# Patient Record
Sex: Female | Born: 1970 | Race: Black or African American | Hispanic: No | Marital: Married | Smoking: Never smoker
Health system: Southern US, Community
[De-identification: ages and names within clinical notes are randomized; demographics above are authoritative.]

## PROBLEM LIST (undated history)

## (undated) DIAGNOSIS — M1990 Unspecified osteoarthritis, unspecified site: Secondary | ICD-10-CM

## (undated) DIAGNOSIS — L509 Urticaria, unspecified: Secondary | ICD-10-CM

## (undated) DIAGNOSIS — D649 Anemia, unspecified: Secondary | ICD-10-CM

## (undated) DIAGNOSIS — D259 Leiomyoma of uterus, unspecified: Secondary | ICD-10-CM

## (undated) DIAGNOSIS — J302 Other seasonal allergic rhinitis: Secondary | ICD-10-CM

## (undated) DIAGNOSIS — D219 Benign neoplasm of connective and other soft tissue, unspecified: Secondary | ICD-10-CM

## (undated) DIAGNOSIS — A159 Respiratory tuberculosis unspecified: Secondary | ICD-10-CM

## (undated) DIAGNOSIS — IMO0002 Reserved for concepts with insufficient information to code with codable children: Secondary | ICD-10-CM

## (undated) DIAGNOSIS — I8390 Asymptomatic varicose veins of unspecified lower extremity: Secondary | ICD-10-CM

## (undated) DIAGNOSIS — N920 Excessive and frequent menstruation with regular cycle: Secondary | ICD-10-CM

## (undated) DIAGNOSIS — T7840XA Allergy, unspecified, initial encounter: Secondary | ICD-10-CM

## (undated) DIAGNOSIS — S83207A Unspecified tear of unspecified meniscus, current injury, left knee, initial encounter: Secondary | ICD-10-CM

## (undated) HISTORY — DX: Benign neoplasm of connective and other soft tissue, unspecified: D21.9

## (undated) HISTORY — PX: ENDOSCOPIC VEIN LASER TREATMENT: SHX1508

## (undated) HISTORY — DX: Urticaria, unspecified: L50.9

## (undated) HISTORY — DX: Allergy, unspecified, initial encounter: T78.40XA

## (undated) HISTORY — DX: Anemia, unspecified: D64.9

## (undated) HISTORY — PX: HERNIA REPAIR: SHX51

## (undated) HISTORY — PX: ESSURE TUBAL LIGATION: SUR464

---

## 2001-10-03 DIAGNOSIS — Z201 Contact with and (suspected) exposure to tuberculosis: Secondary | ICD-10-CM

## 2001-10-03 HISTORY — DX: Contact with and (suspected) exposure to tuberculosis: Z20.1

## 2007-07-31 ENCOUNTER — Inpatient Hospital Stay (HOSPITAL_COMMUNITY): Admission: AD | Admit: 2007-07-31 | Discharge: 2007-08-03 | Payer: Self-pay | Admitting: Obstetrics

## 2008-05-30 ENCOUNTER — Ambulatory Visit (HOSPITAL_COMMUNITY): Admission: RE | Admit: 2008-05-30 | Discharge: 2008-05-30 | Payer: Self-pay | Admitting: Obstetrics

## 2009-01-24 ENCOUNTER — Emergency Department (HOSPITAL_COMMUNITY): Admission: EM | Admit: 2009-01-24 | Discharge: 2009-01-24 | Payer: Self-pay | Admitting: Family Medicine

## 2010-11-18 ENCOUNTER — Encounter: Payer: Self-pay | Admitting: Family Medicine

## 2010-11-18 ENCOUNTER — Ambulatory Visit (INDEPENDENT_AMBULATORY_CARE_PROVIDER_SITE_OTHER): Payer: Self-pay | Admitting: Family Medicine

## 2010-11-18 DIAGNOSIS — K130 Diseases of lips: Secondary | ICD-10-CM | POA: Insufficient documentation

## 2010-11-22 ENCOUNTER — Other Ambulatory Visit (HOSPITAL_COMMUNITY): Payer: Self-pay | Admitting: Obstetrics & Gynecology

## 2010-11-22 DIAGNOSIS — N971 Female infertility of tubal origin: Secondary | ICD-10-CM

## 2010-11-24 NOTE — Assessment & Plan Note (Signed)
Summary: new pt to estab---umr //ns fee///sph   Vital Signs:  Patient profile:   40 year old female Menstrual status:  regular LMP:     10/21/2010 Height:      64 inches Weight:      258.0 pounds BMI:     44.45 Pulse rate:   72 / minute Pulse rhythm:   regular BP sitting:   116 / 68  (left arm) Cuff size:   large  Vitals Entered By: Almeta Monas CMA Duncan Dull) (November 18, 2010 1:08 PM) CC: New Est Care--treated for strep 3 weeks ago and c/o dry lips LMP (date): 10/21/2010     Menstrual Status regular Enter LMP: 10/21/2010   History of Present Illness: Pt here to establish ---pt had strep about 3 weeks ago.  Pt was given amoxicillin with no relief and then was given omnicef.  ST went away but then pt was c/o sore lips and tongue.  Her previous doctor gave her a yeast cream but she did not feel like that was the right thing to do.  It has improved but lips are still dry and cracked.    Preventive Screening-Counseling & Management  Alcohol-Tobacco     Smoking Status: never  Caffeine-Diet-Exercise     Does Patient Exercise: yes      Drug Use:  no.    Problems Prior to Update: None  Medications Prior to Update: 1)  None  Current Medications (verified): 1)  Multivitamins  Caps (Multiple Vitamin) .... By Mouth Once Daily 2)  Omega-3 350 Mg Caps (Omega-3 Fatty Acids) .... By Mouth Once Daily 3)  Fish Oil 1000 Mg Caps (Omega-3 Fatty Acids) .... By Mouth Once Daily 4)  Vitamin B-12 100 Mcg Tabs (Cyanocobalamin) .... By Mouth Once Daily 5)  Bee Pollen Plus Ginseng 250-250 Mg Caps (Bee Pollen-Ginseng) .... By Mouth Once Daily  Allergies (verified): No Known Drug Allergies  Past History:  Social History: Last updated: 11/18/2010 Occupation: Married Never Smoked Alcohol use-yes Drug use-no Regular exercise-yes  Risk Factors: Exercise: yes (11/18/2010)  Risk Factors: Smoking Status: never (11/18/2010)  Past Medical History: Tuberculosis Exposure  Social  History: Occupation: Married Never Smoked Alcohol use-yes Drug use-no Regular exercise-yes Occupation:  employed Smoking Status:  never Drug Use:  no Does Patient Exercise:  yes  Review of Systems      See HPI  Physical Exam  General:  Well-developed,well-nourished,in no acute distress; alert,appropriate and cooperative throughout examination Mouth:  dry lips and cracks in corner of mouth Psych:  Oriented X3 and normally interactive.     Impression & Recommendations:  Problem # 1:  ANGULAR CHEILITIS (ICD-528.5) nystatin ointment b1 thiamine rto as needed   Complete Medication List: 1)  Multivitamins Caps (Multiple vitamin) .... By mouth once daily 2)  Omega-3 350 Mg Caps (Omega-3 fatty acids) .... By mouth once daily 3)  Vitamin B-12 100 Mcg Tabs (Cyanocobalamin) .... By mouth once daily 4)  Bee Pollen Plus Ginseng 250-250 Mg Caps (Bee pollen-ginseng) .... By mouth once daily 5)  Allegra Allergy 180 Mg Tabs (Fexofenadine hcl) .Marland Kitchen.. 1 by mouth once daily as needed 6)  Nystatin 100000 Unit/gm Oint (Nystatin)  Patient Instructions: 1)  use nystatin ointment that was given to you already 2)  b vitamins esp thiamine/ b1 may help as well   Orders Added: 1)  New Patient Level II [40981]

## 2010-12-01 ENCOUNTER — Ambulatory Visit (HOSPITAL_COMMUNITY)
Admission: RE | Admit: 2010-12-01 | Discharge: 2010-12-01 | Disposition: A | Discharge status: Home / Self Care | Payer: 59 | Source: Ambulatory Visit | Attending: Obstetrics & Gynecology | Admitting: Obstetrics & Gynecology

## 2010-12-01 DIAGNOSIS — Z3049 Encounter for surveillance of other contraceptives: Secondary | ICD-10-CM | POA: Insufficient documentation

## 2010-12-01 DIAGNOSIS — N971 Female infertility of tubal origin: Secondary | ICD-10-CM

## 2010-12-02 ENCOUNTER — Telehealth: Payer: Self-pay | Admitting: Family Medicine

## 2010-12-09 NOTE — Progress Notes (Signed)
Summary: mouth/ lip  still no better  Phone Note Refill Request Call back at [phone]   Refills Requested: Medication #1:  NYSTATIN 100000 UNIT/GM OINT. Pt states that med is not helping. Pt c/o extra dry chapped lip and funny taste in mouth. Pt is request Rx for magic mouthwash. [facility] [address]  Pls advise .Marland KitchenMarland KitchenMarland KitchenFelecia Deloach CMA  December 02, 2010 4:59 PM    Follow-up for Phone Call        magic mouthwash 150 cc 5 ml swish and spit qid  if no better --refer to derm for lip but she should then she dentist about mouth Follow-up by: Loreen Freud DO,  December 02, 2010 5:11 PM  Additional Follow-up for Phone Call Additional follow up Details #1::        pt aware of the above.Marland KitchenMarland KitchenRx faxed to pharmacy Additional Follow-up by: Almeta Monas CMA Duncan Dull),  December 02, 2010 5:16 PM    New/Updated Medications: * MAGIC MOUTHWASH 150CC 5ml swish and spit four times a days Prescriptions: MAGIC MOUTHWASH 150CC 5ml swish and spit four times a days  #1 x 0   Entered by:   Almeta Monas CMA (AAMA)   Authorized by:   Loreen Freud DO   Signed by:   Almeta Monas CMA (AAMA) on 12/02/2010   Method used:   Faxed to ...       [facility] [phone]* (retail)       [address]       [place], [place]  [address]       Ph: [phone]       Fax: [phone]   RxID:   [id number]

## 2011-07-13 LAB — CBC
MCHC: 33.7
MCHC: 33.9
MCV: 85.2
Platelets: 179
Platelets: 203
RDW: 14.9 — ABNORMAL HIGH
RDW: 15.2 — ABNORMAL HIGH

## 2011-07-13 LAB — GLUCOSE, RANDOM: Glucose, Bld: 87

## 2011-07-13 LAB — RPR: RPR Ser Ql: NONREACTIVE

## 2012-01-20 ENCOUNTER — Encounter (INDEPENDENT_AMBULATORY_CARE_PROVIDER_SITE_OTHER): Payer: Self-pay | Admitting: General Surgery

## 2012-01-20 ENCOUNTER — Ambulatory Visit (INDEPENDENT_AMBULATORY_CARE_PROVIDER_SITE_OTHER): Payer: Commercial Managed Care - PPO | Admitting: General Surgery

## 2012-01-20 VITALS — BP 110/78 | HR 82 | Temp 96.8°F | Ht 65.0 in | Wt 249.0 lb

## 2012-01-20 DIAGNOSIS — D171 Benign lipomatous neoplasm of skin and subcutaneous tissue of trunk: Secondary | ICD-10-CM

## 2012-01-20 DIAGNOSIS — K439 Ventral hernia without obstruction or gangrene: Secondary | ICD-10-CM

## 2012-01-20 DIAGNOSIS — D1779 Benign lipomatous neoplasm of other sites: Secondary | ICD-10-CM

## 2012-01-20 NOTE — Progress Notes (Signed)
Patient ID: [name], female   DOB: Jan 10, 1971, 41 y.o.   MRN: [id number]  Chief Complaint  Patient presents with  . Pre-op Exam    eval hernia    HPI Stephanie Mcconnell is a 41 y.o. female.  This patient presents for evaluation of abdominal pain and a bulge in her midabdomen. She says that she has had a bulge for several years and just recently has began causing her symptoms. She says she has pain whenever she does activity and she has been trying to exercise and lose some weight but with the activity causes her pain. The discomfort she says is difficult to describe but she does state that it is relieved with rest. She does feel that the bulge has increased in size over this several years if he's had it appeared his denies any peptic symptoms such as nausea, vomiting, constipation or diarrhea. She also complains of a mass in her right upper back which does not cause any particular discomfort but has slightly increased in size as well over several years. She had seen a doctor previously for this and she was told that it was a lipoma and that there was no need to do anything about this. HPI  History reviewed. No pertinent past medical history. Bulging disk  History reviewed. No pertinent past surgical history.  History reviewed. No pertinent family history.  Social History History  Substance Use Topics  . Smoking status: Never Smoker   . Smokeless tobacco: Not on file  . Alcohol Use: Yes     rarely    No Known Allergies  Current Outpatient Prescriptions  Medication Sig Dispense Refill  . gabapentin (NEURONTIN) 300 MG capsule Take 600 mg by mouth 3 (three) times daily.      Marland Kitchen oxyCODONE-acetaminophen (PERCOCET) 5-325 MG per tablet Take 1 tablet by mouth as needed.        Review of Systems Review of Systems All other review of systems negative or noncontributory except as stated in the HPI  Blood pressure 110/78, pulse 82, temperature 96.8 F (36 C), temperature source Temporal,  height 5\' 5"  (1.651 m), weight 249 lb (112.946 kg), SpO2 98.00%.  Physical Exam Physical Exam Physical Exam  Nursing note and vitals reviewed. Constitutional: She is oriented to person, place, and time. She appears well-developed and well-nourished. No distress.  HENT:  Head: Normocephalic and atraumatic.  Mouth/Throat: No oropharyngeal exudate.  Eyes: Conjunctivae and EOM are normal. Pupils are equal, round, and reactive to light. Right eye exhibits no discharge. Left eye exhibits no discharge. No scleral icterus.  Neck: Normal range of motion. Neck supple. No tracheal deviation present.  Cardiovascular: Normal rate, regular rhythm, normal heart sounds and intact distal pulses.   Pulmonary/Chest: Effort normal and breath sounds normal. No stridor. No respiratory distress. She has no wheezes.  Abdominal: Soft. Bowel sounds are normal. She exhibits no distension and no mass. There is no tenderness. There is no rebound and no guarding. I do not feel any obvious bulge, such as hernia or lipoma. Musculoskeletal: Normal range of motion. She exhibits no edema and no tenderness.  Neurological: She is alert and oriented to person, place, and time.  Skin: Skin is warm and dry. No rash noted. She is not diaphoretic. No erythema. No pallor.  She has a large 12cm mass medial to her right scapula consistent with a hernia. Psychiatric: She has a normal mood and affect. Her behavior is normal. Judgment and thought content normal.  Data Reviewed   Assessment    Back lipoma I think that her back mass is most likely consistent with a lipoma. It is fairly large and so while we are imaging her abdomen to evaluate for possible hernia, I have added a CT chest as well which will hopefully visualize this mass appeared to have offered her excision of this and discussion of the risks of the procedure including infection, bleeding, pain, scarring, recurrence, need for future surgery, and nerve injury she expressed  understanding and would like to proceed with excision. Abdominal pain and possible hernia I do not appreciate any bulge on exam today due to her body habitus but it sounds like she is describing a midline ventral hernia or a possible lipoma. Given the fact that it is symptomatic and she says that she can reduce this, it sounds like a hernia and I recommended a CT scan of the abdomen to visualize this as well. If it is a small hernia that I would recommend open repair if it is a larger defect, then we would consider laparoscopic repair. We discussed the procedure and the risks of this as well. I will discuss the CT results with her after she obtains her scan    Plan    Ct scan chest and abdomen and we will schedule surgery after this.       Lodema Pilot DAVID 01/20/2012, 9:43 AM

## 2012-01-23 ENCOUNTER — Ambulatory Visit
Admission: RE | Admit: 2012-01-23 | Discharge: 2012-01-23 | Disposition: A | Discharge status: Home / Self Care | Payer: 59 | Source: Ambulatory Visit | Attending: General Surgery | Admitting: General Surgery

## 2012-01-23 DIAGNOSIS — D171 Benign lipomatous neoplasm of skin and subcutaneous tissue of trunk: Secondary | ICD-10-CM

## 2012-01-23 DIAGNOSIS — K439 Ventral hernia without obstruction or gangrene: Secondary | ICD-10-CM

## 2012-01-23 MED ORDER — IOHEXOL 300 MG/ML  SOLN
125.0000 mL | Freq: Once | INTRAMUSCULAR | Status: AC | PRN
  Administered 2012-01-23: 125 mL via INTRAVENOUS

## 2012-01-31 ENCOUNTER — Telehealth (INDEPENDENT_AMBULATORY_CARE_PROVIDER_SITE_OTHER): Payer: Self-pay | Admitting: General Surgery

## 2012-01-31 NOTE — Telephone Encounter (Signed)
Patient requesting CT results.

## 2012-02-09 ENCOUNTER — Encounter (INDEPENDENT_AMBULATORY_CARE_PROVIDER_SITE_OTHER): Payer: Self-pay

## 2012-02-13 ENCOUNTER — Telehealth (INDEPENDENT_AMBULATORY_CARE_PROVIDER_SITE_OTHER): Payer: Self-pay

## 2012-02-13 NOTE — Telephone Encounter (Signed)
Called Stephanie Mcconnell to discuss CT results with patient, patient has been informed that treatment plan will be discuss with Dr. Biagio Quint and we will contact her later this week to discuss.

## 2012-02-17 ENCOUNTER — Encounter (INDEPENDENT_AMBULATORY_CARE_PROVIDER_SITE_OTHER): Payer: Self-pay | Admitting: General Surgery

## 2012-02-20 ENCOUNTER — Ambulatory Visit (INDEPENDENT_AMBULATORY_CARE_PROVIDER_SITE_OTHER): Payer: Commercial Managed Care - PPO | Admitting: General Surgery

## 2012-02-20 VITALS — BP 130/84 | HR 71 | Temp 98.1°F | Resp 16 | Ht 65.0 in | Wt 254.2 lb

## 2012-02-20 DIAGNOSIS — D1779 Benign lipomatous neoplasm of other sites: Secondary | ICD-10-CM

## 2012-02-20 DIAGNOSIS — K439 Ventral hernia without obstruction or gangrene: Secondary | ICD-10-CM

## 2012-02-20 DIAGNOSIS — D171 Benign lipomatous neoplasm of skin and subcutaneous tissue of trunk: Secondary | ICD-10-CM

## 2012-02-20 NOTE — Progress Notes (Signed)
Patient ID: Stephanie Mcconnell, female   DOB: 07/09/1971, 41 y.o.   MRN: [id number]  Chief Complaint  Patient presents with  . Follow-up    Follow up from CT scan    HPI Stephanie Mcconnell is a 41 y.o. female.   HPI This patient follows up after CT scan to evaluate her back mass and abdominal wall mass. The back mass was consistent with a lipoma as expected and remains relatively asymptomatic. She is symptomatic from her abdominal wall mass which was consistent with a ventral hernia on CT scan. She says that this bothers her whenever she lifts or works out.  No past medical history on file.  No past surgical history on file.  No family history on file.  Social History History  Substance Use Topics  . Smoking status: Never Smoker   . Smokeless tobacco: Not on file  . Alcohol Use: Yes     rarely    No Known Allergies  Current Outpatient Prescriptions  Medication Sig Dispense Refill  . gabapentin (NEURONTIN) 300 MG capsule Take 600 mg by mouth 3 (three) times daily.      . oxyCODONE-acetaminophen (PERCOCET) 5-325 MG per tablet Take 1 tablet by mouth as needed.        Review of Systems Review of Systems All other review of systems negative or noncontributory except as stated in the HPI  Blood pressure 130/84, pulse 71, temperature 98.1 F (36.7 C), temperature source Temporal, resp. rate 16, height 5' 5" (1.651 m), weight 254 lb 3.2 oz (115.304 kg).  Physical Exam Physical Exam Physical Exam  Nursing note and vitals reviewed. Constitutional: She is oriented to person, place, and time. She appears well-developed and well-nourished. No distress.  HENT:  Head: Normocephalic and atraumatic.  Mouth/Throat: No oropharyngeal exudate.  Eyes: Conjunctivae and EOM are normal. Pupils are equal, round, and reactive to light. Right eye exhibits no discharge. Left eye exhibits no discharge. No scleral icterus.  Neck: Normal range of motion. Neck supple. No tracheal deviation present.    Cardiovascular: Normal rate, regular rhythm, normal heart sounds and intact distal pulses.   Pulmonary/Chest: Effort normal and breath sounds normal. No stridor. No respiratory distress. She has no wheezes.  Abdominal: Soft. Bowel sounds are normal. She exhibits no distension and no mass. There is no tenderness. There is no rebound and no guarding.  I do not appreciate the mass/hernia on exam but it is visible on CT Musculoskeletal: Normal range of motion. She exhibits no edema and no tenderness.  Neurological: She is alert and oriented to person, place, and time.  Skin: Skin is warm and dry. No rash noted. She is not diaphoretic. No erythema. No pallor. Large 15cm right back mass, near right scapula c/w lipoma Psychiatric: She has a normal mood and affect. Her behavior is normal. Judgment and thought content normal.    Data Reviewed CT  Assessment    Back lipoma Ventral hernia She is symptomatic from this ventral hernia and she would like to have this repaired first. The pain is to be able to fix both of these problems at the same time. However, because one is in the back and one is on the abdomen, I am concerned that this might be too much at one time. We will consider doing both of the same time but she has stated that she would like to have the ventral hernia repaired first and we will definitely plan to do this with or without the lipoma excision   as well. I discussed with her the risks and benefits of the procedure and risks of infection bleeding, pain, scarring, recurrence, need for open surgery, bowel injury and the need to do separate procedures for both of these. She expressed understanding and would like to proceed with a minimum of hernia repair. We will plan on starting laparoscopically and if the defect is not easily visualized, we will proceed with open repair.    Plan    Will set her up with lap ventral hernia repair with mesh, possible open, and possible excision of back mass  at the same time vs. Staged procedure.       Isaih Bulger DAVID 02/20/2012, 11:28 AM    

## 2012-03-01 ENCOUNTER — Encounter (HOSPITAL_COMMUNITY): Payer: Self-pay

## 2012-03-01 NOTE — Progress Notes (Addendum)
Pt called to obtain PAT assessment/Hx.  Pt denies having any cardiac and/or sleep studies.  Pt's PCP: Dr. Mindi Junker Hawks,[phone].  Pt instructed on lab appt date and time.  Pt verbalized back both and understanding of where to come. Pt request Advanced Directive Packet when she comes for lab appt.//L. Angenette Daily,RN

## 2012-03-02 ENCOUNTER — Encounter (HOSPITAL_COMMUNITY): Payer: Self-pay

## 2012-03-05 ENCOUNTER — Encounter (HOSPITAL_COMMUNITY)
Admission: RE | Admit: 2012-03-05 | Discharge: 2012-03-05 | Disposition: A | Discharge status: Home / Self Care | Payer: 59 | Source: Ambulatory Visit | Attending: General Surgery | Admitting: General Surgery

## 2012-03-05 VITALS — BP 121/72 | HR 79 | Temp 97.8°F | Resp 20 | Ht 65.0 in | Wt 257.4 lb

## 2012-03-05 DIAGNOSIS — K439 Ventral hernia without obstruction or gangrene: Secondary | ICD-10-CM

## 2012-03-05 DIAGNOSIS — D171 Benign lipomatous neoplasm of skin and subcutaneous tissue of trunk: Secondary | ICD-10-CM

## 2012-03-05 HISTORY — DX: Respiratory tuberculosis unspecified: A15.9

## 2012-03-05 LAB — CBC
MCH: 28.2 pg (ref 26.0–34.0)
MCHC: 33.4 g/dL (ref 30.0–36.0)
MCV: 84.4 fL (ref 78.0–100.0)
Platelets: 265 10*3/uL (ref 150–400)
RBC: 4.5 MIL/uL (ref 3.87–5.11)
RDW: 15.3 % (ref 11.5–15.5)

## 2012-03-05 LAB — SURGICAL PCR SCREEN: MRSA, PCR: NEGATIVE

## 2012-03-05 NOTE — Pre-Procedure Instructions (Signed)
20 Stephanie Mcconnell  03/05/2012   Your procedure is scheduled on:  Monday March 12, 2012.  Report to [facility] at 0830 AM.  Call this number if you have problems the morning of surgery: [phone]   Remember:   Do not eat food:After Midnight.  May have clear liquids: up to 4 Hours before arrival until 0430 am.  Clear liquids include soda, tea, black coffee, apple or grape juice, broth.  Take these medicines the morning of surgery with A SIP OF WATER: Oxycodone (Percocet) if needed for pain.   Do not wear jewelry, make-up or nail polish.  Do not wear lotions, powders, or perfumes. You may wear deodorant.  Do not shave 48 hours prior to surgery. Men may shave face and neck.  Do not bring valuables to the hospital.  Contacts, dentures or bridgework may not be worn into surgery.  Leave suitcase in the car. After surgery it may be brought to your room.  For patients admitted to the hospital, checkout time is 11:00 AM the day of discharge.   Patients discharged the day of surgery will not be allowed to drive home.  Name and phone number of your driver:   Special Instructions: CHG Shower Use Special Wash: 1/2 bottle night before surgery and 1/2 bottle morning of surgery.   Please read over the following fact sheets that you were given: Pain Booklet, Coughing and Deep Breathing, MRSA Information and Surgical Site Infection Prevention

## 2012-03-06 NOTE — Consult Note (Signed)
Anesthesia Chart Review:  Patient is a 41 year old female scheduled for laparoscopic VHR with mesh, possible excision of right back mass (felt to be a lipoma) on 03/12/12.  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  PCP is Dr. Fredia Beets.    Labs acceptable.  Plan to proceed.  Shonna Chock, PA-C

## 2012-03-11 MED ORDER — CEFAZOLIN SODIUM-DEXTROSE 2-3 GM-% IV SOLR
2.0000 g | INTRAVENOUS | Status: AC
  Administered 2012-03-12: 2 g via INTRAVENOUS
  Filled 2012-03-11: qty 50

## 2012-03-12 ENCOUNTER — Encounter (HOSPITAL_COMMUNITY)
Admission: RE | Disposition: A | Discharge status: Home / Self Care | Payer: Self-pay | Source: Ambulatory Visit | Attending: General Surgery

## 2012-03-12 ENCOUNTER — Encounter (HOSPITAL_COMMUNITY): Payer: Self-pay | Admitting: Vascular Surgery

## 2012-03-12 ENCOUNTER — Inpatient Hospital Stay (HOSPITAL_COMMUNITY)
Admission: RE | Admit: 2012-03-12 | Discharge: 2012-03-14 | DRG: 355 | Disposition: A | Discharge status: Home / Self Care | Payer: 59 | Source: Ambulatory Visit | Attending: General Surgery | Admitting: General Surgery

## 2012-03-12 ENCOUNTER — Encounter (HOSPITAL_COMMUNITY): Payer: Self-pay | Admitting: *Deleted

## 2012-03-12 ENCOUNTER — Ambulatory Visit (HOSPITAL_COMMUNITY): Payer: 59 | Admitting: Vascular Surgery

## 2012-03-12 DIAGNOSIS — K439 Ventral hernia without obstruction or gangrene: Principal | ICD-10-CM | POA: Diagnosis present

## 2012-03-12 DIAGNOSIS — R112 Nausea with vomiting, unspecified: Secondary | ICD-10-CM | POA: Diagnosis not present

## 2012-03-12 DIAGNOSIS — D1739 Benign lipomatous neoplasm of skin and subcutaneous tissue of other sites: Secondary | ICD-10-CM

## 2012-03-12 DIAGNOSIS — K432 Incisional hernia without obstruction or gangrene: Secondary | ICD-10-CM | POA: Diagnosis present

## 2012-03-12 DIAGNOSIS — D171 Benign lipomatous neoplasm of skin and subcutaneous tissue of trunk: Secondary | ICD-10-CM

## 2012-03-12 DIAGNOSIS — D1779 Benign lipomatous neoplasm of other sites: Secondary | ICD-10-CM | POA: Diagnosis present

## 2012-03-12 DIAGNOSIS — Z79899 Other long term (current) drug therapy: Secondary | ICD-10-CM

## 2012-03-12 HISTORY — DX: Reserved for concepts with insufficient information to code with codable children: IMO0002

## 2012-03-12 HISTORY — PX: VENTRAL HERNIA REPAIR: SHX424

## 2012-03-12 HISTORY — PX: MASS EXCISION: SHX2000

## 2012-03-12 SURGERY — REPAIR, HERNIA, VENTRAL, LAPAROSCOPIC
Anesthesia: General | Site: Back | Laterality: Right | Wound class: Clean

## 2012-03-12 MED ORDER — EPHEDRINE SULFATE 50 MG/ML IJ SOLN
INTRAMUSCULAR | Status: DC | PRN
  Administered 2012-03-12: 15 mg via INTRAVENOUS
  Administered 2012-03-12: 10 mg via INTRAVENOUS

## 2012-03-12 MED ORDER — HEPARIN SODIUM (PORCINE) 5000 UNIT/ML IJ SOLN
5000.0000 [IU] | Freq: Once | INTRAMUSCULAR | Status: AC
  Administered 2012-03-12: 5000 [IU] via SUBCUTANEOUS
  Filled 2012-03-12: qty 1

## 2012-03-12 MED ORDER — ROCURONIUM BROMIDE 100 MG/10ML IV SOLN
INTRAVENOUS | Status: DC | PRN
  Administered 2012-03-12: 50 mg via INTRAVENOUS

## 2012-03-12 MED ORDER — LACTATED RINGERS IV SOLN
INTRAVENOUS | Status: DC
  Administered 2012-03-12: 10:00:00 via INTRAVENOUS

## 2012-03-12 MED ORDER — FENTANYL CITRATE 0.05 MG/ML IJ SOLN
INTRAMUSCULAR | Status: DC | PRN
  Administered 2012-03-12: 50 ug via INTRAVENOUS
  Administered 2012-03-12 (×2): 25 ug via INTRAVENOUS
  Administered 2012-03-12: 150 ug via INTRAVENOUS

## 2012-03-12 MED ORDER — LIDOCAINE HCL (CARDIAC) 20 MG/ML IV SOLN
INTRAVENOUS | Status: DC | PRN
  Administered 2012-03-12: 100 mg via INTRAVENOUS

## 2012-03-12 MED ORDER — ENOXAPARIN SODIUM 40 MG/0.4ML ~~LOC~~ SOLN
40.0000 mg | SUBCUTANEOUS | Status: DC
  Administered 2012-03-13 – 2012-03-14 (×2): 40 mg via SUBCUTANEOUS
  Filled 2012-03-12 (×2): qty 0.4

## 2012-03-12 MED ORDER — PHENYLEPHRINE HCL 10 MG/ML IJ SOLN
INTRAMUSCULAR | Status: DC | PRN
  Administered 2012-03-12: 120 ug via INTRAVENOUS
  Administered 2012-03-12 (×2): 80 ug via INTRAVENOUS
  Administered 2012-03-12: 120 ug via INTRAVENOUS
  Administered 2012-03-12 (×3): 80 ug via INTRAVENOUS

## 2012-03-12 MED ORDER — ONDANSETRON HCL 4 MG/2ML IJ SOLN
INTRAMUSCULAR | Status: DC | PRN
  Administered 2012-03-12: 4 mg via INTRAVENOUS

## 2012-03-12 MED ORDER — CEFAZOLIN SODIUM-DEXTROSE 2-3 GM-% IV SOLR
2.0000 g | Freq: Once | INTRAVENOUS | Status: AC
  Administered 2012-03-12: 2 g via INTRAVENOUS
  Filled 2012-03-12: qty 50

## 2012-03-12 MED ORDER — LACTATED RINGERS IV SOLN
INTRAVENOUS | Status: DC | PRN
  Administered 2012-03-12 (×3): via INTRAVENOUS

## 2012-03-12 MED ORDER — MIDAZOLAM HCL 2 MG/ML PO SYRP: 0.5000 mg/kg | ORAL_SOLUTION | Freq: Once | ORAL | Status: DC

## 2012-03-12 MED ORDER — GABAPENTIN 300 MG PO CAPS
600.0000 mg | ORAL_CAPSULE | Freq: Three times a day (TID) | ORAL | Status: DC
Start: 2012-03-12 — End: 2012-03-14
  Administered 2012-03-12 – 2012-03-14 (×6): 600 mg via ORAL
  Filled 2012-03-12 (×9): qty 2

## 2012-03-12 MED ORDER — 0.9 % SODIUM CHLORIDE (POUR BTL) OPTIME
TOPICAL | Status: DC | PRN
  Administered 2012-03-12: 1000 mL

## 2012-03-12 MED ORDER — OXYCODONE-ACETAMINOPHEN 5-325 MG PO TABS
1.0000 | ORAL_TABLET | ORAL | Status: DC | PRN
  Administered 2012-03-12 – 2012-03-13 (×4): 2 via ORAL
  Filled 2012-03-12 (×4): qty 2

## 2012-03-12 MED ORDER — MIDAZOLAM HCL 2 MG/2ML IJ SOLN: 0.5000 mg | INTRAMUSCULAR | Status: DC | PRN

## 2012-03-12 MED ORDER — KETOROLAC TROMETHAMINE 30 MG/ML IJ SOLN
30.0000 mg | Freq: Four times a day (QID) | INTRAMUSCULAR | Status: DC | PRN
  Administered 2012-03-12: 30 mg via INTRAVENOUS
  Filled 2012-03-12: qty 1

## 2012-03-12 MED ORDER — ONDANSETRON HCL 4 MG/2ML IJ SOLN: 4.0000 mg | Freq: Once | INTRAMUSCULAR | Status: DC | PRN

## 2012-03-12 MED ORDER — GLYCOPYRROLATE 0.2 MG/ML IJ SOLN
INTRAMUSCULAR | Status: DC | PRN
  Administered 2012-03-12: .7 mg via INTRAVENOUS

## 2012-03-12 MED ORDER — ONDANSETRON HCL 4 MG PO TABS
4.0000 mg | ORAL_TABLET | Freq: Four times a day (QID) | ORAL | Status: DC | PRN
  Administered 2012-03-13: 4 mg via ORAL
  Filled 2012-03-12: qty 1

## 2012-03-12 MED ORDER — HYDROMORPHONE HCL PF 1 MG/ML IJ SOLN
INTRAMUSCULAR | Status: AC
  Filled 2012-03-12: qty 1

## 2012-03-12 MED ORDER — MORPHINE SULFATE 2 MG/ML IJ SOLN
1.0000 mg | INTRAMUSCULAR | Status: DC | PRN
  Administered 2012-03-12 (×2): 2 mg via INTRAVENOUS
  Filled 2012-03-12 (×2): qty 1

## 2012-03-12 MED ORDER — LACTATED RINGERS IV SOLN: INTRAVENOUS | Status: DC

## 2012-03-12 MED ORDER — KCL IN DEXTROSE-NACL 20-5-0.45 MEQ/L-%-% IV SOLN
INTRAVENOUS | Status: DC
  Administered 2012-03-12 – 2012-03-13 (×2): via INTRAVENOUS
  Filled 2012-03-12 (×5): qty 1000

## 2012-03-12 MED ORDER — HYDROMORPHONE HCL PF 1 MG/ML IJ SOLN
0.2500 mg | INTRAMUSCULAR | Status: DC | PRN
  Administered 2012-03-12 (×3): 0.5 mg via INTRAVENOUS

## 2012-03-12 MED ORDER — MONTELUKAST SODIUM 10 MG PO TABS
10.0000 mg | ORAL_TABLET | Freq: Every evening | ORAL | Status: DC | PRN
  Filled 2012-03-12: qty 1

## 2012-03-12 MED ORDER — PROPOFOL 10 MG/ML IV BOLUS
INTRAVENOUS | Status: DC | PRN
  Administered 2012-03-12: 120 mg via INTRAVENOUS

## 2012-03-12 MED ORDER — VECURONIUM BROMIDE 10 MG IV SOLR
INTRAVENOUS | Status: DC | PRN
  Administered 2012-03-12: 2 mg via INTRAVENOUS
  Administered 2012-03-12: 1 mg via INTRAVENOUS

## 2012-03-12 MED ORDER — NEOSTIGMINE METHYLSULFATE 1 MG/ML IJ SOLN
INTRAMUSCULAR | Status: DC | PRN
  Administered 2012-03-12: 4 mg via INTRAVENOUS

## 2012-03-12 MED ORDER — ONDANSETRON HCL 4 MG/2ML IJ SOLN
4.0000 mg | Freq: Four times a day (QID) | INTRAMUSCULAR | Status: DC | PRN
  Administered 2012-03-12: 4 mg via INTRAVENOUS
  Filled 2012-03-12: qty 2

## 2012-03-12 MED ORDER — LIDOCAINE-EPINEPHRINE (PF) 1 %-1:200000 IJ SOLN
INTRAMUSCULAR | Status: DC | PRN
  Administered 2012-03-12: 14:00:00

## 2012-03-12 MED ORDER — MIDAZOLAM HCL 5 MG/5ML IJ SOLN
INTRAMUSCULAR | Status: DC | PRN
  Administered 2012-03-12: 2 mg via INTRAVENOUS

## 2012-03-12 MED ORDER — FENTANYL CITRATE 0.05 MG/ML IJ SOLN: 50.0000 ug | INTRAMUSCULAR | Status: DC | PRN

## 2012-03-12 SURGICAL SUPPLY — 87 items
ADH SKN CLS APL DERMABOND .7 (GAUZE/BANDAGES/DRESSINGS) ×6
ADH SKN CLS LQ APL DERMABOND (GAUZE/BANDAGES/DRESSINGS)
APL SKNCLS STERI-STRIP NONHPOA (GAUZE/BANDAGES/DRESSINGS) ×2
APPLIER CLIP LOGIC TI 5 (MISCELLANEOUS) IMPLANT
APPLIER CLIP ROT 10 11.4 M/L (STAPLE)
APR CLP MED LRG 11.4X10 (STAPLE)
APR CLP MED LRG 33X5 (MISCELLANEOUS)
BENZOIN TINCTURE PRP APPL 2/3 (GAUZE/BANDAGES/DRESSINGS) ×1 IMPLANT
BINDER ABD UNIV 12 45-62 (WOUND CARE) IMPLANT
BINDER ABDOMINAL 46IN 62IN (WOUND CARE)
BLADE SURG 10 STRL SS (BLADE) ×3 IMPLANT
BLADE SURG 15 STRL LF DISP TIS (BLADE) ×2 IMPLANT
BLADE SURG 15 STRL SS (BLADE) ×3
BLADE SURG ROTATE 9660 (MISCELLANEOUS) IMPLANT
CANISTER SUCTION 2500CC (MISCELLANEOUS) IMPLANT
CHLORAPREP W/TINT 26ML (MISCELLANEOUS) ×3 IMPLANT
CLIP APPLIE ROT 10 11.4 M/L (STAPLE) IMPLANT
CLOTH BEACON ORANGE TIMEOUT ST (SAFETY) ×3 IMPLANT
COVER SURGICAL LIGHT HANDLE (MISCELLANEOUS) ×3 IMPLANT
DECANTER SPIKE VIAL GLASS SM (MISCELLANEOUS) IMPLANT
DERMABOND ADHESIVE PROPEN (GAUZE/BANDAGES/DRESSINGS)
DERMABOND ADVANCED (GAUZE/BANDAGES/DRESSINGS) ×3
DERMABOND ADVANCED .7 DNX12 (GAUZE/BANDAGES/DRESSINGS) IMPLANT
DERMABOND ADVANCED .7 DNX6 (GAUZE/BANDAGES/DRESSINGS) ×2 IMPLANT
DEVICE SECURE STRAP 25 ABSORB (INSTRUMENTS) ×2 IMPLANT
DEVICE TROCAR PUNCTURE CLOSURE (ENDOMECHANICALS) ×3 IMPLANT
DRAPE INCISE IOBAN 66X45 STRL (DRAPES) ×3 IMPLANT
DRAPE LAPAROSCOPIC ABDOMINAL (DRAPES) IMPLANT
DRAPE PED LAPAROTOMY (DRAPES) ×1 IMPLANT
DRAPE UTILITY 15X26 W/TAPE STR (DRAPE) ×6 IMPLANT
DRSG TEGADERM 4X4.75 (GAUZE/BANDAGES/DRESSINGS) IMPLANT
ELECT CAUTERY BLADE 6.4 (BLADE) ×3 IMPLANT
ELECT REM PT RETURN 9FT ADLT (ELECTROSURGICAL) ×3
ELECTRODE REM PT RTRN 9FT ADLT (ELECTROSURGICAL) ×2 IMPLANT
GLOVE BIOGEL PI IND STRL 7.0 (GLOVE) IMPLANT
GLOVE BIOGEL PI IND STRL 7.5 (GLOVE) IMPLANT
GLOVE BIOGEL PI INDICATOR 7.0 (GLOVE) ×1
GLOVE BIOGEL PI INDICATOR 7.5 (GLOVE) ×1
GLOVE ECLIPSE 7.5 STRL STRAW (GLOVE) ×1 IMPLANT
GLOVE OPTIFIT SS 6.0 STRL BRWN (GLOVE) ×1 IMPLANT
GLOVE SS BIOGEL STRL SZ 6.5 (GLOVE) IMPLANT
GLOVE SUPERSENSE BIOGEL SZ 6.5 (GLOVE) ×1
GLOVE SURG SIGNA 7.5 PF LTX (GLOVE) ×1 IMPLANT
GLOVE SURG SS PI 7.0 STRL IVOR (GLOVE) ×1 IMPLANT
GLOVE SURG SS PI 7.5 STRL IVOR (GLOVE) ×6 IMPLANT
GOWN PREVENTION PLUS XLARGE (GOWN DISPOSABLE) ×5 IMPLANT
GOWN STRL NON-REIN LRG LVL3 (GOWN DISPOSABLE) ×4 IMPLANT
KIT BASIN OR (CUSTOM PROCEDURE TRAY) ×3 IMPLANT
KIT ROOM TURNOVER OR (KITS) ×3 IMPLANT
MESH PHYSIO OVAL 10X15CM (Mesh General) ×1 IMPLANT
NDL HYPO 25X1 1.5 SAFETY (NEEDLE) ×2 IMPLANT
NDL SPNL 22GX3.5 QUINCKE BK (NEEDLE) ×2 IMPLANT
NEEDLE HYPO 25X1 1.5 SAFETY (NEEDLE) ×3 IMPLANT
NEEDLE SPNL 22GX3.5 QUINCKE BK (NEEDLE) ×6 IMPLANT
NS IRRIG 1000ML POUR BTL (IV SOLUTION) ×3 IMPLANT
PACK SURGICAL SETUP 50X90 (CUSTOM PROCEDURE TRAY) ×2 IMPLANT
PAD ARMBOARD 7.5X6 YLW CONV (MISCELLANEOUS) ×6 IMPLANT
PEN SKIN MARKING BROAD (MISCELLANEOUS) ×3 IMPLANT
PENCIL BUTTON HOLSTER BLD 10FT (ELECTRODE) ×3 IMPLANT
SCALPEL HARMONIC ACE (MISCELLANEOUS) IMPLANT
SCISSORS LAP 5X35 DISP (ENDOMECHANICALS) IMPLANT
SET IRRIG TUBING LAPAROSCOPIC (IRRIGATION / IRRIGATOR) IMPLANT
SLEEVE ENDOPATH XCEL 5M (ENDOMECHANICALS) ×4 IMPLANT
SPECIMEN JAR MEDIUM (MISCELLANEOUS) ×3 IMPLANT
SPONGE GAUZE 4X4 12PLY (GAUZE/BANDAGES/DRESSINGS) ×1 IMPLANT
SPONGE LAP 18X18 X RAY DECT (DISPOSABLE) ×2 IMPLANT
STRIP CLOSURE SKIN 1/2X4 (GAUZE/BANDAGES/DRESSINGS) ×1 IMPLANT
SUT GORETEX 48 CV 2 THX 26 (SUTURE) ×1 IMPLANT
SUT MNCRL AB 4-0 PS2 18 (SUTURE) ×4 IMPLANT
SUT PROLENE 0 CT 1 CR/8 (SUTURE) ×1 IMPLANT
SUT VIC AB 2-0 CT1 27 (SUTURE) ×6
SUT VIC AB 2-0 CT1 TAPERPNT 27 (SUTURE) IMPLANT
SUT VIC AB 3-0 SH 27 (SUTURE) ×3
SUT VIC AB 3-0 SH 27XBRD (SUTURE) ×2 IMPLANT
SYR BULB 3OZ (MISCELLANEOUS) ×3 IMPLANT
SYR CONTROL 10ML LL (SYRINGE) ×3 IMPLANT
TAPE CLOTH SURG 6X10 WHT LF (GAUZE/BANDAGES/DRESSINGS) ×1 IMPLANT
TOWEL OR 17X24 6PK STRL BLUE (TOWEL DISPOSABLE) ×3 IMPLANT
TOWEL OR 17X26 10 PK STRL BLUE (TOWEL DISPOSABLE) ×3 IMPLANT
TRAY FOLEY CATH 14FRSI W/METER (CATHETERS) ×3 IMPLANT
TRAY LAPAROSCOPIC (CUSTOM PROCEDURE TRAY) ×3 IMPLANT
TROCAR BALLN 12MMX100 BLUNT (TROCAR) ×1 IMPLANT
TROCAR XCEL NON-BLD 11X100MML (ENDOMECHANICALS) ×3 IMPLANT
TROCAR XCEL NON-BLD 5MMX100MML (ENDOMECHANICALS) ×3 IMPLANT
TUBE CONNECTING 12X1/4 (SUCTIONS) IMPLANT
WATER STERILE IRR 1000ML POUR (IV SOLUTION) IMPLANT
YANKAUER SUCT BULB TIP NO VENT (SUCTIONS) IMPLANT

## 2012-03-12 NOTE — Op Note (Signed)
NAMESHIANA, RAPPLEYE               ACCOUNT NO.:  [id number]  MEDICAL RECORD NO.:  [id number]  LOCATION:  5159                         FACILITY:  [facility]  PHYSICIAN:  Lodema Pilot, MD       DATE OF BIRTH:  06-05-1971  DATE OF PROCEDURE:  03/12/2012 DATE OF DISCHARGE:                              OPERATIVE REPORT   PROCEDURE:  Laparoscopic repair of ventral hernia with mesh and excision of right back mass.  SURGEON:  Lodema Pilot, MD  ASSISTANT:  Dr. Ezzard Standing.  ANESTHESIA:  General endotracheal anesthesia with 30 mL of 1% lidocaine with epinephrine and 0.25% Marcaine in a 50:50 mixture.  FLUIDS:  2500 mL of crystalloid.  ESTIMATED BLOOD LOSS:  Minimal.  DRAINS:  None.  SPECIMENS:  Right back mass measuring 9 cm x 8 cm sent to pathology for permanent section.  COMPLICATIONS:  None apparent.  FINDINGS:  Small midline ventral hernia at the base of the falciform ligament 2 cm in maximal dimension, with a significant amount of preperitoneal fat reduced from the defect.  No evidence of bowel contents.  Placement of a 10 cm x 15 cm Physiomesh and excision of right upper back mass measuring approximately 9 cm x 8 cm.  INDICATION FOR PROCEDURE:  Ms. Cavey is a 41 year old female, who works as an [employer] and has a palpable bulge in the midline of her abdomen.  She says that it has been increasingly more uncomfortable recently and has prevented her from actually working up and she has been trying to get back into shape and it causes discomfort for her.  She had a CT scan, which demonstrated a fat containing ventral hernia in the area of concern.  She also had a 10 cm mass in the subcutaneous tissues just medial to her scapula which was causing minimal discomfort, but had been increasing in size, and desired excision of this mass.  Despite the fact that her insurance had denied the initial request for her reimbursement.  OPERATIVE DETAILS:  Ms. Schamp was  seen and evaluated in the preoperative area, and risks and benefits of procedure were again discussed in lay terms.  Informed consent was obtained.  The surgical site was marked with the patient prior to anesthetic administration.  The area of the palpable hernia was marked as well as the back mass.  She was then taken to the operating room, placed on table in a supine position with her arms tucked bilaterally.  Foley catheter was placed.  Prophylactic antibiotics were given and general endotracheal anesthesia obtained. Also, given her a prophylactic subcu heparin prior to the case.  Her abdomen was then prepped and draped in the standard surgical fashion. Procedure time-out was performed with all operative team members to confirm proper patient and procedure, and Ioban drape was placed to minimize the skin contact with the mesh.  A 5-mm Optiview trocar was used to access the abdomen in the left upper quadrant and pneumoperitoneum was obtained.  An 11 mm left lateral trocar was placed under direct visualization, and two 5 mm right lateral abdominal trocars were also placed under direct visualization.  She was noted to have a hernia  defect at the base of the falciform ligament, and I opened the peritoneum with the scissors, there was no bowel in the defect, and then using the heated scissors I took down the peritoneum and then I was able to manually reduce the preperitoneal fat, and she actually had a fairly significant amount of preperitoneal fat from the falciform ligament which was incarcerated up into the hernia defect, this was taken down out of the whole leading at approximately a 2 cm x 2 cm defect in the falciform ligament was taken down further cephalad to allow for adequate overlap, and then a 10 cm x 15 cm piece of Physiomesh was selected and 2- 0 Gore-Tex sutures were placed at the cephalad and caudad aspect of the mesh.  The mesh was rolled and placed into the abdomen and  unrolled, using the Endoclose device, the sutures were pulled out through the abdominal wall transvaginally to at least 5 cm of overlap on each side of the cephalad and caudad aspects of the mesh.  There was actually more overlapped to the corner.  These sutures were pulled up and secured and the mesh was tacked laterally on each side, taking care not to stretch the mesh too much to 1 side or the other.  Then, the mesh tacked to the abdominal wall.  Transfascial sutures were placed on each side of the lateral aspects of the mesh with a 0 Prolene sutures passed transfascial at the periphery of the mesh.  Then, the SecureStrap tacking device was used to tack the mesh circumferentially around the periphery of the mesh at approximately 1 cm intervals around the mesh.  Additional transfascial sutures were placed circumferentially using 0 Prolene sutures for a total of 8 transfascial sutures, all placed under direct visualization.  The mesh appeared to be centered over the defect and the mesh was stretched tight and again cover the defect with wide overlap. The abdominal wall contents were then noted be hemostatic and the abdominal wall was noted to be hemostatic, and the 11 mm trocar site was closed with the Endoclose device and a 0 Vicryl figure-of-eight suture. The trocars were then removed under direct visualization.  Abdominal wall was noted to be hemostatic.  The abdominal contents were noted to be hemostatic without any evidence of bowel injury or bleeding.  The skin edges were approximated with 4-0 Monocryl subcuticular suture, and it was washed and dried and Dermabond was applied to all skin incisions. The transfascial suture sites were closed with Dermabond.  The patient was then rolled up on three-quarter prone at a most lateral position and the area around the back was prepped and draped in similar fashion using new towels and drapes.  A transverse incision was made over the  palpable mass.  Dissection carried down into the fatty tissue using Bovie electrocautery.  The mass was dissected circumferentially using mostly blunt dissection, although the mass was very loculated and lobulated and it did not come out, it was not well encapsulated.  I delivered most of the fatty tissue up into the mesh, but this was intramuscular and subfascial in some areas as it appeared to be lobulated mass and almost a Swiss cheese type defects as well where the fat was entering the fascia and subcutaneous tissue.  The bulk of the mass was removed and measured approximately 9 cm x 8 cm, and I palpated the area and additional pockets of lipomatous tissue were removed, and after I was certain that I had removed all of  the lipomatous tissue, the wound was inspected for hemostasis which was noted be adequate.  Irrigation returned clear and the dermis was then approximated with interrupted 2-0 Vicryl sutures and the skin edges were approximated with 2-0 nylon vertical mattress sutures.  The skin was washed and dried and a sterile pressure dressing was applied.  All sponge, needle, and instrument counts were correct at the end of the case.  The patient tolerated the procedure well, without apparent complications.  She was rolled back on her back for extubation and noted that 2 of the transfascial suture sites some of the Dermabond had come up into bowel, she was still anesthetized, re-prepped this area and removed the Dermabond and that was mostly removed and approximated these 2 transfascial suture sites with benzoin and Steri-Strips.  All sponge, needle, and instrument counts were correct at the end of the case.  Patient tolerated the procedure well without apparent complications.          ______________________________ Lodema Pilot, MD     BL/MEDQ  D:  03/12/2012  T:  03/12/2012  Job:  [id number]

## 2012-03-12 NOTE — Preoperative (Signed)
Beta Blockers   Reason not to administer Beta Blockers:Not Applicable 

## 2012-03-12 NOTE — Transfer of Care (Signed)
Immediate Anesthesia Transfer of Care Note  Patient: Stephanie Mcconnell  Procedure(s) Performed: Procedure(s) (LRB): LAPAROSCOPIC VENTRAL HERNIA (N/A) INSERTION OF MESH (N/A) EXCISION MASS (Right)  Patient Location: PACU  Anesthesia Type: General  Level of Consciousness: awake, alert , oriented and patient cooperative  Airway & Oxygen Therapy: Patient Spontanous Breathing and Patient connected to nasal cannula oxygen  Post-op Assessment: Report given to PACU RN, Post -op Vital signs reviewed and stable and Patient moving all extremities X 4  Post vital signs: Reviewed and stable  Complications: No apparent anesthesia complications

## 2012-03-12 NOTE — Anesthesia Procedure Notes (Signed)
Procedure Name: Intubation Date/Time: 03/12/2012 11:16 AM Performed by: Rogelia Boga Pre-anesthesia Checklist: Patient identified, Emergency Drugs available, Suction available, Patient being monitored and Timeout performed Patient Re-evaluated:Patient Re-evaluated prior to inductionOxygen Delivery Method: Circle system utilized Preoxygenation: Pre-oxygenation with 100% oxygen Intubation Type: IV induction Ventilation: Mask ventilation without difficulty and Oral airway inserted - appropriate to patient size Laryngoscope Size: Mac and 4 Grade View: Grade I Tube type: Oral Tube size: 7.5 mm Number of attempts: 1 Airway Equipment and Method: Stylet Placement Confirmation: ETT inserted through vocal cords under direct vision,  positive ETCO2 and breath sounds checked- equal and bilateral Secured at: 21 cm Tube secured with: Tape Dental Injury: Teeth and Oropharynx as per pre-operative assessment  Comments: Pt intubated by Shanda Bumps May, SRNA, AOI

## 2012-03-12 NOTE — H&P (View-Only) (Signed)
Patient ID: Stephanie Mcconnell, female   DOB: 1971-02-04, 41 y.o.   MRN: [id number]  Chief Complaint  Patient presents with  . Follow-up    Follow up from CT scan    HPI Stephanie Mcconnell is a 41 y.o. female.   HPI This patient follows up after CT scan to evaluate her back mass and abdominal wall mass. The back mass was consistent with a lipoma as expected and remains relatively asymptomatic. She is symptomatic from her abdominal wall mass which was consistent with a ventral hernia on CT scan. She says that this bothers her whenever she lifts or works out.  No past medical history on file.  No past surgical history on file.  No family history on file.  Social History History  Substance Use Topics  . Smoking status: Never Smoker   . Smokeless tobacco: Not on file  . Alcohol Use: Yes     rarely    No Known Allergies  Current Outpatient Prescriptions  Medication Sig Dispense Refill  . gabapentin (NEURONTIN) 300 MG capsule Take 600 mg by mouth 3 (three) times daily.      Marland Kitchen oxyCODONE-acetaminophen (PERCOCET) 5-325 MG per tablet Take 1 tablet by mouth as needed.        Review of Systems Review of Systems All other review of systems negative or noncontributory except as stated in the HPI  Blood pressure 130/84, pulse 71, temperature 98.1 F (36.7 C), temperature source Temporal, resp. rate 16, height 5\' 5"  (1.651 m), weight 254 lb 3.2 oz (115.304 kg).  Physical Exam Physical Exam Physical Exam  Nursing note and vitals reviewed. Constitutional: She is oriented to person, place, and time. She appears well-developed and well-nourished. No distress.  HENT:  Head: Normocephalic and atraumatic.  Mouth/Throat: No oropharyngeal exudate.  Eyes: Conjunctivae and EOM are normal. Pupils are equal, round, and reactive to light. Right eye exhibits no discharge. Left eye exhibits no discharge. No scleral icterus.  Neck: Normal range of motion. Neck supple. No tracheal deviation present.    Cardiovascular: Normal rate, regular rhythm, normal heart sounds and intact distal pulses.   Pulmonary/Chest: Effort normal and breath sounds normal. No stridor. No respiratory distress. She has no wheezes.  Abdominal: Soft. Bowel sounds are normal. She exhibits no distension and no mass. There is no tenderness. There is no rebound and no guarding.  I do not appreciate the mass/hernia on exam but it is visible on CT Musculoskeletal: Normal range of motion. She exhibits no edema and no tenderness.  Neurological: She is alert and oriented to person, place, and time.  Skin: Skin is warm and dry. No rash noted. She is not diaphoretic. No erythema. No pallor. Large 15cm right back mass, near right scapula c/w lipoma Psychiatric: She has a normal mood and affect. Her behavior is normal. Judgment and thought content normal.    Data Reviewed CT  Assessment    Back lipoma Ventral hernia She is symptomatic from this ventral hernia and she would like to have this repaired first. The pain is to be able to fix both of these problems at the same time. However, because one is in the back and one is on the abdomen, I am concerned that this might be too much at one time. We will consider doing both of the same time but she has stated that she would like to have the ventral hernia repaired first and we will definitely plan to do this with or without the lipoma excision  as well. I discussed with her the risks and benefits of the procedure and risks of infection bleeding, pain, scarring, recurrence, need for open surgery, bowel injury and the need to do separate procedures for both of these. She expressed understanding and would like to proceed with a minimum of hernia repair. We will plan on starting laparoscopically and if the defect is not easily visualized, we will proceed with open repair.    Plan    Will set her up with lap ventral hernia repair with mesh, possible open, and possible excision of back mass  at the same time vs. Staged procedure.       Lodema Pilot DAVID 02/20/2012, 11:28 AM

## 2012-03-12 NOTE — Brief Op Note (Signed)
03/12/2012  2:16 PM  PATIENT:  Stephanie Mcconnell  41 y.o. female  PRE-OPERATIVE DIAGNOSIS:  ventral hernia and right back mass   POST-OPERATIVE DIAGNOSIS:  ventral hernia and right back mass   PROCEDURE:  Procedure(s) (LRB): LAPAROSCOPIC VENTRAL HERNIA (N/A) INSERTION OF MESH (N/A) EXCISION MASS (Right)  SURGEON:  Surgeon(s) and Role:    * Lodema Pilot, DO - Primary  PHYSICIAN ASSISTANT:   ASSISTANTS: Newman   ANESTHESIA:   general  EBL:  Total I/O In: 2600 [I.V.:2600] Out: 250 [Urine:250]  BLOOD ADMINISTERED:none  DRAINS: none   LOCAL MEDICATIONS USED:  MARCAINE    and LIDOCAINE   SPECIMEN:  Source of Specimen:  9cm x 8cm right back lipoma  DISPOSITION OF SPECIMEN:  PATHOLOGY  COUNTS:  YES  TOURNIQUET:  * No tourniquets in log *  DICTATION: .Other Dictation: Dictation Number   PLAN OF CARE: Admit to inpatient   PATIENT DISPOSITION:  PACU - hemodynamically stable.   Delay start of Pharmacological VTE agent (>24hrs) due to surgical blood loss or risk of bleeding: no

## 2012-03-12 NOTE — Anesthesia Preprocedure Evaluation (Addendum)
Anesthesia Evaluation  Patient identified by MRN, date of birth, ID band Patient awake    Reviewed: Allergy & Precautions, H&P , NPO status , Patient's Chart, lab work & pertinent test results  Airway Mallampati: II TM Distance: >3 FB Neck ROM: Full    Dental  (+) Dental Advisory Given   Pulmonary          Cardiovascular     Neuro/Psych    GI/Hepatic   Endo/Other  Morbid obesity  Renal/GU      Musculoskeletal  (+) Arthritis -, Osteoarthritis,    Abdominal (+) + obese,  Abdomen: soft.    Peds  Hematology   Anesthesia Other Findings   Reproductive/Obstetrics                          Anesthesia Physical Anesthesia Plan  ASA: II  Anesthesia Plan: General   Post-op Pain Management:    Induction: Intravenous  Airway Management Planned: Oral ETT  Additional Equipment:   Intra-op Plan:   Post-operative Plan: Extubation in OR  Informed Consent: I have reviewed the patients History and Physical, chart, labs and discussed the procedure including the risks, benefits and alternatives for the proposed anesthesia with the patient or authorized representative who has indicated his/her understanding and acceptance.   Dental advisory given  Plan Discussed with: CRNA, Surgeon and Anesthesiologist  Anesthesia Plan Comments:        Anesthesia Quick Evaluation

## 2012-03-12 NOTE — Interval H&P Note (Signed)
History and Physical Interval Note:  03/12/2012 10:24 AM  Stephanie Mcconnell  has presented today for surgery, with the diagnosis of repair defect in abdominal wall and possibly remove mass from back   The various methods of treatment have been discussed with the patient and family. After consideration of risks, benefits and other options for treatment, the patient has consented to  Procedure(s) (LRB): LAPAROSCOPIC VENTRAL HERNIA (N/A) INSERTION OF MESH (N/A) EXCISION MASS (Right) as a surgical intervention .  The patients' history has been reviewed, patient examined, no change in status, stable for surgery.  I have reviewed the patients' chart and labs.  Questions were answered to the patient's satisfaction.  Hernia and back mass marked with the patient.  She denies any changes from prior exam.  I again discussed the planned procedure and the risks and she expressed understanding and desires to proceed with lap ventral hernia repair with mesh and excision of back mass.  Risks of infection, bleeding, pain, scarring, recurrence, bowel injury, persistent bulge, seroma, need for open surgery, nerve injury discussed and she desires to proceed with lap ventral hernia repair with mesh and excision of back mass.  I told her that her insurance rejected paying for her back mass but she wants to do this anyway.  She understands that she will be responsible for paying for this out of pocket.   Lodema Pilot DAVID

## 2012-03-12 NOTE — Anesthesia Postprocedure Evaluation (Signed)
Anesthesia Post Note  Patient: Stephanie Mcconnell  Procedure(s) Performed: Procedure(s) (LRB): LAPAROSCOPIC VENTRAL HERNIA (N/A) INSERTION OF MESH (N/A) EXCISION MASS (Right)  Anesthesia type: general  Patient location: PACU  Post pain: Pain level controlled  Post assessment: Patient's Cardiovascular Status Stable  Last Vitals:  Filed Vitals:   03/12/12 1500  BP: 112/61  Pulse: 78  Temp: 36.1 C  Resp: 18    Post vital signs: Reviewed and stable  Level of consciousness: sedated  Complications: No apparent anesthesia complications

## 2012-03-12 NOTE — OR Nursing (Signed)
1st procedure end at 1246. 2nd procedure start at 1301

## 2012-03-13 ENCOUNTER — Encounter (HOSPITAL_COMMUNITY): Payer: Self-pay | Admitting: General Surgery

## 2012-03-13 MED ORDER — PROMETHAZINE HCL 25 MG/ML IJ SOLN
25.0000 mg | Freq: Four times a day (QID) | INTRAMUSCULAR | Status: DC | PRN
  Administered 2012-03-13: 25 mg via INTRAVENOUS
  Filled 2012-03-13: qty 1

## 2012-03-13 MED ORDER — OXYCODONE-ACETAMINOPHEN 5-325 MG PO TABS
1.0000 | ORAL_TABLET | ORAL | Status: DC | PRN
  Administered 2012-03-13 – 2012-03-14 (×3): 2 via ORAL
  Filled 2012-03-13 (×3): qty 2

## 2012-03-13 MED ORDER — HYDROCODONE-ACETAMINOPHEN 5-325 MG PO TABS
1.0000 | ORAL_TABLET | ORAL | Status: DC | PRN
  Administered 2012-03-13 (×2): 2 via ORAL
  Filled 2012-03-13 (×2): qty 2

## 2012-03-13 NOTE — Progress Notes (Signed)
1 Day Post-Op  Subjective: Had some nausea last night but improved this am.  Pain improved as well today  Objective: Vital signs in last 24 hours: Temp:  [97 F (36.1 C)-98.3 F (36.8 C)] 98.3 F (36.8 C) (06/11 0556) Pulse Rate:  [63-100] 82  (06/11 0556) Resp:  [14-20] 18  (06/11 0556) BP: (97-141)/(58-79) 97/60 mmHg (06/11 0556) SpO2:  [96 %-100 %] 98 % (06/11 0556) Weight:  [257 lb 6 oz (116.745 kg)] 257 lb 6 oz (116.745 kg) (06/11 0202) Last BM Date: 03/11/12  Intake/Output from previous day: 06/10 0701 - 06/11 0700 In: 4366 [P.O.:240; I.V.:4126] Out: 2275 [Urine:2275] Intake/Output this shift:    General appearance: alert, cooperative and no distress Back: incision without infection or evidence of fluid collection Resp: clear to auscultation bilaterally Cardio: regular rate and rhythm, S1, S2 normal, no murmur, click, rub or gallop GI: soft, appropriated tenderness, ND, wounds without infection Extremities: scd's in place Neurologic: Grossly normal  Lab Results:  No results found for this basename: WBC:2,HGB:2,HCT:2,PLT:2 in the last 72 hours BMET No results found for this basename: NA:2,K:2,CL:2,CO2:2,GLUCOSE:2,BUN:2,CREATININE:2,CALCIUM:2 in the last 72 hours PT/INR No results found for this basename: LABPROT:2,INR:2 in the last 72 hours ABG No results found for this basename: PHART:2,PCO2:2,PO2:2,HCO3:2 in the last 72 hours  Studies/Results: No results found.  Anti-infectives: Anti-infectives     Start     Dose/Rate Route Frequency Ordered Stop   03/12/12 1830   ceFAZolin (ANCEF) IVPB 2 g/50 mL premix        2 g 100 mL/hr over 30 Minutes Intravenous  Once 03/12/12 1530 03/12/12 1813   03/11/12 1218   ceFAZolin (ANCEF) IVPB 2 g/50 mL premix        2 g 100 mL/hr over 30 Minutes Intravenous 60 min pre-op 03/11/12 1218 03/12/12 1120          Assessment/Plan: s/p Procedure(s) (LRB): LAPAROSCOPIC VENTRAL HERNIA (N/A) INSERTION OF MESH (N/A) EXCISION  MASS (Right) diet as tolerated.  OOB and ambulate.  May be okay for discharge later today if pain controlled and tolerating diet or may stay another day for pain control if needed.  we will reevaluate later today to see if pain is well enough controlled for discharge.  LOS: 1 day    Stephanie Mcconnell 03/13/2012

## 2012-03-13 NOTE — Progress Notes (Signed)
She is doing better with pain but had some nausea without vomiting.  We will try to exchange the pain meds and see if this may be contributing to the nausea but otherwise she looks well.

## 2012-03-13 NOTE — Progress Notes (Signed)
Pt  ceontinues to c/o surgical pain to abdomen despite  Vicodin 2 po given at 2030,requested to try Percocet again because she had more pain relief from this than Vicodin and this was not the cause of the nausea and vomiting on day shift,offered Toradol IV but would like to take pain med instead,MD on call notified and received order to try Percocet.

## 2012-03-13 NOTE — Progress Notes (Signed)
Gave pt Percocet 5/325 mg 2 po with no c/o nausea /vomiting.Will monitor.

## 2012-03-13 NOTE — OR Nursing (Signed)
Late entry: Position for insertion of mesh is same as position for lap ventral hernia repair.  Position for 2nd procedure( right back mass excision) included the following: foampad under head, inflated beanbag under torso, gelroll under left axilla, left arm extended on armboard, pillow x2 between bilateral arms, tape securing bilateral arms in place, foampad under left knee, pillow x2 between legs and knees, foampads under bilateral heels, safety strap across bilateral hips, tape across lower legs.

## 2012-03-14 MED ORDER — HYDROCODONE-ACETAMINOPHEN 5-325 MG PO TABS: 1.0000 | ORAL_TABLET | Freq: Four times a day (QID) | ORAL | Status: AC | PRN

## 2012-03-14 NOTE — Progress Notes (Signed)
Pt followed for progression of care as a benefit of [employer]/ insurance. Pt will receive a post d/c transition of care call to assess recovery and any other needs. Brooke Bonito C. Roena Malady, RN, CCM, [facility] Care Management hospital liaison, # [phone].

## 2012-03-14 NOTE — Discharge Summary (Signed)
  Physician Discharge Summary  Patient ID: Stephanie Mcconnell: [id number] DOB/AGE: 11/25/1970 41 y.o.  Admit date: 03/12/2012 Discharge date: 03/14/2012  Admission Diagnoses: ventral hernia  Discharge Diagnoses: same Active Problems:  * No active hospital problems. *    Discharged Condition: stable  Hospital Course: to OR 03/12/12 for lap ventral hernia and excision of back mass.  Admitted for pain control.  On POD 1 had some nausea and stayed for another day.  On POD 2 her nausea improved and she was tolerating regular diet and pain controlled.  Stable for discharge on POD 2  Consults: None  Significant Diagnostic Studies: none  Treatments: surgery: 03/12/12 lap ventral hernia repair with mesh  Disposition:   Discharge Orders    Future Orders Please Complete By Expires   Diet - low sodium heart healthy      Increase activity slowly      Discharge instructions      Comments:   May shower starting tomorrow. No lifting more than 10lbs for 4 weeks. Call [phone] for follow up appointment in about 10-12 days for suture removal.   Call MD for:  temperature >100.4      Call MD for:  persistant nausea and vomiting      Call MD for:  severe uncontrolled pain      Call MD for:  redness, tenderness, or signs of infection (pain, swelling, redness, odor or green/yellow discharge around incision site)        Medication List  As of 03/14/2012  8:17 AM   TAKE these medications         gabapentin 300 MG capsule   Commonly known as: NEURONTIN   Take 600 mg by mouth 3 (three) times daily.      HYDROcodone-acetaminophen 5-325 MG per tablet   Commonly known as: NORCO   Take 1-2 tablets by mouth every 6 (six) hours as needed (pain).      montelukast 10 MG tablet   Commonly known as: SINGULAIR   Take 10 mg by mouth at bedtime as needed. For allergies      multivitamin capsule   Take 1 capsule by mouth daily.      oxyCODONE-acetaminophen 5-325 MG per tablet   Commonly known as:  PERCOCET   Take 1 tablet by mouth as needed.             SignedLodema Pilot DAVID 03/14/2012, 8:17 AM

## 2012-03-14 NOTE — Progress Notes (Signed)
Patient ate her lunch with no complaints of nausea, discharged to home as ordered, instructions given and verbalized understanding, escorted by family

## 2012-03-14 NOTE — Progress Notes (Signed)
2 Days Post-Op  Subjective: Episode of vomiting last night.  No nausea or vomiting overnight though.    Objective: Vital signs in last 24 hours: Temp:  [98 F (36.7 C)-98.2 F (36.8 C)] 98.1 F (36.7 C) (06/12 0530) Pulse Rate:  [70-91] 70  (06/12 0530) Resp:  [18-20] 18  (06/12 0530) BP: (104-121)/(49-60) 110/52 mmHg (06/12 0530) SpO2:  [98 %-100 %] 98 % (06/12 0530) Last BM Date: 03/11/12  Intake/Output from previous day: 06/11 0701 - 06/12 0700 In: 995 [P.O.:120; I.V.:875] Out: 1500 [Urine:1400; Emesis/NG output:100] Intake/Output this shift:    General appearance: alert, cooperative and no distress Resp: clear to auscultation bilaterally Cardio: regular rate and rhythm, S1, S2 normal, no murmur, click, rub or gallop GI: soft, decreased tenderness, ND, incisions okay without infection  Lab Results:  No results found for this basename: WBC:2,HGB:2,HCT:2,PLT:2 in the last 72 hours BMET No results found for this basename: NA:2,K:2,CL:2,CO2:2,GLUCOSE:2,BUN:2,CREATININE:2,CALCIUM:2 in the last 72 hours PT/INR No results found for this basename: LABPROT:2,INR:2 in the last 72 hours ABG No results found for this basename: PHART:2,PCO2:2,PO2:2,HCO3:2 in the last 72 hours  Studies/Results: No results found.  Anti-infectives: Anti-infectives     Start     Dose/Rate Route Frequency Ordered Stop   03/12/12 1830   ceFAZolin (ANCEF) IVPB 2 g/50 mL premix        2 g 100 mL/hr over 30 Minutes Intravenous  Once 03/12/12 1530 03/12/12 1813   03/11/12 1218   ceFAZolin (ANCEF) IVPB 2 g/50 mL premix        2 g 100 mL/hr over 30 Minutes Intravenous 60 min pre-op 03/11/12 1218 03/12/12 1120          Assessment/Plan: s/p Procedure(s) (LRB): LAPAROSCOPIC VENTRAL HERNIA (N/A) INSERTION OF MESH (N/A) EXCISION MASS (Right) given her nausea and vomiting yesterday, I recommended that she stay for another day but she is insisting on going home today.  She looks okay this morning.   Abdomen exam is appropriate.  I recommended that she at least stay for lunch and if she does okay with breakfast and lunch, she can go home.  LOS: 2 days    Stephanie Mcconnell DAVID 03/14/2012

## 2012-03-23 ENCOUNTER — Ambulatory Visit (INDEPENDENT_AMBULATORY_CARE_PROVIDER_SITE_OTHER): Payer: Commercial Managed Care - PPO

## 2012-03-23 DIAGNOSIS — Z4802 Encounter for removal of sutures: Secondary | ICD-10-CM

## 2012-03-28 NOTE — Progress Notes (Unsigned)
03/23/2012  Nurse Only for Suture Removal -- Patient came in for suture removal from back excision of Lipoma, her incision was intact however, it appeared that she had a small collection of fluid just beneath her incision.  Before suture removal i ask Dr. Corliss Skains to evaluate and advise.  Okay to remove sutures, steri-strips placed.  Patient incision intact and she tolerated well.  She will follow up with Dr. Biagio Quint on 04/18/12 @ 2:00 pm for Back Lipoma and Ventral Hernia Repair.  Patient may return to work on or after her follow up appointment with Dr. Biagio Quint on 04/18/12.

## 2012-04-18 ENCOUNTER — Ambulatory Visit (INDEPENDENT_AMBULATORY_CARE_PROVIDER_SITE_OTHER): Payer: Commercial Managed Care - PPO | Admitting: General Surgery

## 2012-04-18 ENCOUNTER — Encounter (INDEPENDENT_AMBULATORY_CARE_PROVIDER_SITE_OTHER): Payer: Self-pay | Admitting: General Surgery

## 2012-04-18 VITALS — BP 104/76 | HR 96 | Temp 97.2°F | Ht 65.0 in | Wt 254.8 lb

## 2012-04-18 DIAGNOSIS — Z5189 Encounter for other specified aftercare: Secondary | ICD-10-CM

## 2012-04-18 DIAGNOSIS — Z4889 Encounter for other specified surgical aftercare: Secondary | ICD-10-CM

## 2012-04-18 NOTE — Progress Notes (Signed)
Subjective:     Patient ID: Stephanie Mcconnell, female   DOB: 15-Nov-1970, 41 y.o.   MRN: [id number]  HPI This patient follows up 5 weeks status post laparoscopic repair of ventral hernia and excision of back mass. Her pathology was benign and consistent with lipoma. She is scheduled to return to work next week. She has been doing well but has some occasional stomach discomfort. She is tolerating regular diet and her bowels are functioning and has minimal discomfort. She says that she is ready to get back into exercising and to resume her diet.  Review of Systems     Objective:   Physical Exam Her incisions are healing well and there is no sign of infection. There is no evidence of recurrent hernia on exam.    Assessment:     Status post laparoscopic repair of ventral hernia and excision of back lipoma-doing well I think that she is doing very well postoperatively. She has some occasional abdominal discomfort which I think is very appropriate for this stage postoperatively. This should resolve with some additional time. There is no evidence of recurrent hernia. I think he would be okay for her to return to work as tolerated and she can increase her activity as tolerated as well. Her pathology was benign.    Plan:     She can return to work as tolerated and gradually increase her activity as tolerated as well. She will followup with me on a p.r.n. basis if she continues to having abdominal pain.

## 2012-09-14 ENCOUNTER — Other Ambulatory Visit (HOSPITAL_COMMUNITY): Payer: Self-pay | Admitting: Obstetrics & Gynecology

## 2012-09-14 DIAGNOSIS — Z1231 Encounter for screening mammogram for malignant neoplasm of breast: Secondary | ICD-10-CM

## 2012-10-08 ENCOUNTER — Ambulatory Visit (HOSPITAL_COMMUNITY)
Admission: RE | Admit: 2012-10-08 | Discharge: 2012-10-08 | Disposition: A | Discharge status: Home / Self Care | Payer: 59 | Source: Ambulatory Visit | Attending: Obstetrics & Gynecology | Admitting: Obstetrics & Gynecology

## 2012-10-08 DIAGNOSIS — Z1231 Encounter for screening mammogram for malignant neoplasm of breast: Secondary | ICD-10-CM | POA: Insufficient documentation

## 2013-11-12 ENCOUNTER — Other Ambulatory Visit (HOSPITAL_COMMUNITY): Payer: Self-pay | Admitting: Obstetrics & Gynecology

## 2013-11-12 DIAGNOSIS — Z1231 Encounter for screening mammogram for malignant neoplasm of breast: Secondary | ICD-10-CM

## 2013-11-19 ENCOUNTER — Ambulatory Visit (HOSPITAL_COMMUNITY): Payer: 59

## 2013-11-21 ENCOUNTER — Ambulatory Visit (HOSPITAL_COMMUNITY)
Admission: RE | Admit: 2013-11-21 | Discharge: 2013-11-21 | Disposition: A | Discharge status: Home / Self Care | Payer: 59 | Source: Ambulatory Visit | Attending: Obstetrics & Gynecology | Admitting: Obstetrics & Gynecology

## 2013-11-21 DIAGNOSIS — Z1231 Encounter for screening mammogram for malignant neoplasm of breast: Secondary | ICD-10-CM | POA: Insufficient documentation

## 2014-01-01 LAB — HM PAP SMEAR

## 2014-03-10 ENCOUNTER — Telehealth: Payer: Self-pay

## 2014-03-10 NOTE — Telephone Encounter (Signed)
New patient Previous PCP:  Dr. Genene Churn see multiple providers--[place], [place] (would see the most)  Medication and allergies:  Reviewed and updated  90 day supply/mail order: n/a Local pharmacy:  [facility] [place]   Immunizations due: Td/tdap   A/P: No changes to personal, family history or past surgical hx  PAP- 01/2014 [facility]- 11/21/13- negative Tdap- received last vaccine greater than 10 years ago; DUE   To Discuss with Provider: Sore throat started Friday evening---redness and swelling noted at the back of throat--rated 4/10---currently hoarse.  Has congestion--yellow phelgm, nasal drainage and feels tired.  Denies fever.  Has been treating with cold/flu cough syrup, cough drops and hot tea--has helped some.  She also c/o sinus pressure and bilateral ears itching.

## 2014-03-11 ENCOUNTER — Encounter: Payer: Self-pay | Admitting: Family Medicine

## 2014-03-11 ENCOUNTER — Ambulatory Visit (INDEPENDENT_AMBULATORY_CARE_PROVIDER_SITE_OTHER): Payer: 59 | Admitting: Family Medicine

## 2014-03-11 VITALS — BP 118/82 | HR 77 | Temp 98.2°F | Ht 65.5 in | Wt 276.0 lb

## 2014-03-11 DIAGNOSIS — J019 Acute sinusitis, unspecified: Secondary | ICD-10-CM

## 2014-03-11 MED ORDER — CEFUROXIME AXETIL 500 MG PO TABS: 500.0000 mg | ORAL_TABLET | Freq: Two times a day (BID) | ORAL | Status: AC

## 2014-03-11 MED ORDER — CETIRIZINE HCL 10 MG PO TABS: 10.0000 mg | ORAL_TABLET | Freq: Every day | ORAL | Status: DC

## 2014-03-11 MED ORDER — FLUTICASONE PROPIONATE 50 MCG/ACT NA SUSP: 2.0000 | Freq: Every day | NASAL | Status: DC

## 2014-03-11 NOTE — Patient Instructions (Signed)

## 2014-03-11 NOTE — Progress Notes (Signed)
Pre visit review using our clinic review tool, if applicable. No additional management support is needed unless otherwise documented below in the visit note. 

## 2014-03-11 NOTE — Progress Notes (Signed)
  Subjective:     Stephanie Mcconnell is a 43 y.o. female who presents for evaluation of symptoms of a URI. Symptoms include bilateral ear pressure/pain, congestion, no  fever, post nasal drip, purulent nasal discharge, sinus pressure, sneezing and sore throat. Onset of symptoms was 5 days ago, and has been gradually worsening since that time. Treatment to date: antihistamines and cough suppressants.  The following portions of the patient's history were reviewed and updated as appropriate:  She  has a past medical history of Tuberculosis and Bulging disc. She  does not have any pertinent problems on file. She  has past surgical history that includes Ventral hernia repair (03/12/2012); Mass excision (03/12/2012); Hernia repair; and Endoscopic vein laser treatment. Her family history is not on file. She  reports that she has never smoked. She does not have any smokeless tobacco history on file. She reports that she drinks about .6 ounces of alcohol per week. She reports that she does not use illicit drugs. She has a current medication list which includes the following prescription(s): multivitamin, phentermine, cefuroxime, cetirizine, and fluticasone. Current Outpatient Prescriptions on File Prior to Visit  Medication Sig Dispense Refill  . Multiple Vitamin (MULTIVITAMIN) capsule Take 1 capsule by mouth daily.      . phentermine 37.5 MG capsule Take 37.5 mg by mouth every morning.       No current facility-administered medications on file prior to visit.   She has No Known Allergies..  Review of Systems Pertinent items are noted in HPI.   Objective:    BP 118/82  Pulse 77  Temp(Src) 98.2 F (36.8 C) (Oral)  Ht 5' 5.5" (1.664 m)  Wt 276 lb (125.193 kg)  BMI 45.21 kg/m2  SpO2 97%  LMP 02/20/2014 General appearance: alert, cooperative, appears stated age and morbidly obese Ears: normal TM's and external ear canals both ears Nose: green discharge, moderate congestion, turbinates red, swollen,  sinus tenderness bilateral Throat: lips, mucosa, and tongue normal; teeth and gums normal Neck: moderate anterior cervical adenopathy and thyroid not enlarged, symmetric, no tenderness/mass/nodules Lungs: clear to auscultation bilaterally Heart: S1, S2 normal Extremities: extremities normal, atraumatic, no cyanosis or edema   Assessment:    sinusitis   Plan:    Discussed the diagnosis and treatment of sinusitis. Suggested symptomatic OTC remedies. Nasal saline spray for congestion. Ceftin per orders. Nasal steroids per orders. Follow up as needed.

## 2014-05-21 ENCOUNTER — Ambulatory Visit (INDEPENDENT_AMBULATORY_CARE_PROVIDER_SITE_OTHER): Payer: 59 | Admitting: Family Medicine

## 2014-05-21 ENCOUNTER — Encounter: Payer: Self-pay | Admitting: Family Medicine

## 2014-05-21 VITALS — BP 118/72 | HR 90 | Temp 98.1°F | Ht 65.5 in | Wt 280.0 lb

## 2014-05-21 DIAGNOSIS — B373 Candidiasis of vulva and vagina: Secondary | ICD-10-CM

## 2014-05-21 DIAGNOSIS — J302 Other seasonal allergic rhinitis: Secondary | ICD-10-CM

## 2014-05-21 DIAGNOSIS — J309 Allergic rhinitis, unspecified: Secondary | ICD-10-CM

## 2014-05-21 DIAGNOSIS — B3731 Acute candidiasis of vulva and vagina: Secondary | ICD-10-CM

## 2014-05-21 DIAGNOSIS — Z0000 Encounter for general adult medical examination without abnormal findings: Secondary | ICD-10-CM

## 2014-05-21 LAB — POCT URINALYSIS DIPSTICK
Bilirubin, UA: NEGATIVE
GLUCOSE UA: NEGATIVE
Ketones, UA: NEGATIVE
Leukocytes, UA: NEGATIVE
NITRITE UA: NEGATIVE
PH UA: 7.5
PROTEIN UA: NEGATIVE
RBC UA: NEGATIVE
Spec Grav, UA: <=1.005
UROBILINOGEN UA: 0.2

## 2014-05-21 LAB — CBC WITH DIFFERENTIAL/PLATELET
BASOS PCT: 0.3 % (ref 0.0–3.0)
Basophils Absolute: 0 10*3/uL (ref 0.0–0.1)
EOS ABS: 0.2 10*3/uL (ref 0.0–0.7)
EOS PCT: 2.4 % (ref 0.0–5.0)
HCT: 39.2 % (ref 36.0–46.0)
Hemoglobin: 13 g/dL (ref 12.0–15.0)
LYMPHS PCT: 32.6 % (ref 12.0–46.0)
Lymphs Abs: 2.2 10*3/uL (ref 0.7–4.0)
MCHC: 33.1 g/dL (ref 30.0–36.0)
MCV: 84.7 fl (ref 78.0–100.0)
Monocytes Absolute: 0.4 10*3/uL (ref 0.1–1.0)
Monocytes Relative: 6.2 % (ref 3.0–12.0)
NEUTROS PCT: 58.5 % (ref 43.0–77.0)
Neutro Abs: 3.9 10*3/uL (ref 1.4–7.7)
Platelets: 245 10*3/uL (ref 150.0–400.0)
RBC: 4.63 Mil/uL (ref 3.87–5.11)
RDW: 16.6 % — ABNORMAL HIGH (ref 11.5–15.5)
WBC: 6.6 10*3/uL (ref 4.0–10.5)

## 2014-05-21 LAB — BASIC METABOLIC PANEL
BUN: 9 mg/dL (ref 6–23)
CALCIUM: 9 mg/dL (ref 8.4–10.5)
CO2: 30 mEq/L (ref 19–32)
CREATININE: 0.7 mg/dL (ref 0.4–1.2)
Chloride: 102 mEq/L (ref 96–112)
GFR: 128.27 mL/min (ref >60.00)
Glucose, Bld: 79 mg/dL (ref 70–99)
Potassium: 3.6 mEq/L (ref 3.5–5.1)
Sodium: 137 mEq/L (ref 135–145)

## 2014-05-21 LAB — HEPATIC FUNCTION PANEL
ALK PHOS: 48 U/L (ref 39–117)
ALT: 19 U/L (ref 0–35)
AST: 21 U/L (ref 0–37)
Albumin: 3.9 g/dL (ref 3.5–5.2)
BILIRUBIN DIRECT: 0 mg/dL (ref 0.0–0.3)
TOTAL PROTEIN: 7.3 g/dL (ref 6.0–8.3)
Total Bilirubin: 0.4 mg/dL (ref 0.2–1.2)

## 2014-05-21 LAB — LIPID PANEL
CHOL/HDL RATIO: 3
Cholesterol: 168 mg/dL (ref 0–200)
HDL: 59.3 mg/dL (ref >39.00)
LDL Cholesterol: 83 mg/dL (ref 0–99)
NONHDL: 108.7
Triglycerides: 128 mg/dL (ref 0.0–149.0)
VLDL: 25.6 mg/dL (ref 0.0–40.0)

## 2014-05-21 LAB — TSH: TSH: 0.43 u[IU]/mL (ref 0.35–4.50)

## 2014-05-21 MED ORDER — LEVOCETIRIZINE DIHYDROCHLORIDE 5 MG PO TABS: 5.0000 mg | ORAL_TABLET | Freq: Every evening | ORAL | Status: DC

## 2014-05-21 MED ORDER — PHENTERMINE HCL 37.5 MG PO CAPS: 37.5000 mg | ORAL_CAPSULE | ORAL | Status: DC

## 2014-05-21 MED ORDER — FLUCONAZOLE 150 MG PO TABS: 150.0000 mg | ORAL_TABLET | Freq: Once | ORAL | Status: DC

## 2014-05-21 NOTE — Patient Instructions (Signed)
Preventive Care for Adults A healthy lifestyle and preventive care can promote health and wellness. Preventive health guidelines for women include the following key practices.  A routine yearly physical is a good way to check with your health care provider about your health and preventive screening. It is a chance to share any concerns and updates on your health and to receive a thorough exam.  Visit your dentist for a routine exam and preventive care every 6 months. Brush your teeth twice a day and floss once a day. Good oral hygiene prevents tooth decay and gum disease.  The frequency of eye exams is based on your age, health, family medical history, use of contact lenses, and other factors. Follow your health care provider's recommendations for frequency of eye exams.  Eat a healthy diet. Foods like vegetables, fruits, whole grains, low-fat dairy products, and lean protein foods contain the nutrients you need without too many calories. Decrease your intake of foods high in solid fats, added sugars, and salt. Eat the right amount of calories for you.Get information about a proper diet from your health care provider, if necessary.  Regular physical exercise is one of the most important things you can do for your health. Most adults should get at least 150 minutes of moderate-intensity exercise (any activity that increases your heart rate and causes you to sweat) each week. In addition, most adults need muscle-strengthening exercises on 2 or more days a week.  Maintain a healthy weight. The body mass index (BMI) is a screening tool to identify possible weight problems. It provides an estimate of body fat based on height and weight. Your health care provider can find your BMI and can help you achieve or maintain a healthy weight.For adults 20 years and older:  A BMI below 18.5 is considered underweight.  A BMI of 18.5 to 24.9 is normal.  A BMI of 25 to 29.9 is considered overweight.  A BMI of  30 and above is considered obese.  Maintain normal blood lipids and cholesterol levels by exercising and minimizing your intake of saturated fat. Eat a balanced diet with plenty of fruit and vegetables. Blood tests for lipids and cholesterol should begin at age 76 and be repeated every 5 years. If your lipid or cholesterol levels are high, you are over 50, or you are at high risk for heart disease, you may need your cholesterol levels checked more frequently.Ongoing high lipid and cholesterol levels should be treated with medicines if diet and exercise are not working.  If you smoke, find out from your health care provider how to quit. If you do not use tobacco, do not start.  Lung cancer screening is recommended for adults aged 22-80 years who are at high risk for developing lung cancer because of a history of smoking. A yearly low-dose CT scan of the lungs is recommended for people who have at least a 30-pack-year history of smoking and are a current smoker or have quit within the past 15 years. A pack year of smoking is smoking an average of 1 pack of cigarettes a day for 1 year (for example: 1 pack a day for 30 years or 2 packs a day for 15 years). Yearly screening should continue until the smoker has stopped smoking for at least 15 years. Yearly screening should be stopped for people who develop a health problem that would prevent them from having lung cancer treatment.  If you are pregnant, do not drink alcohol. If you are breastfeeding,  be very cautious about drinking alcohol. If you are not pregnant and choose to drink alcohol, do not have more than 1 drink per day. One drink is considered to be 12 ounces (355 mL) of beer, 5 ounces (148 mL) of wine, or 1.5 ounces (44 mL) of liquor.  Avoid use of street drugs. Do not share needles with anyone. Ask for help if you need support or instructions about stopping the use of drugs.  High blood pressure causes heart disease and increases the risk of  stroke. Your blood pressure should be checked at least every 1 to 2 years. Ongoing high blood pressure should be treated with medicines if weight loss and exercise do not work.  If you are 3-86 years old, ask your health care provider if you should take aspirin to prevent strokes.  Diabetes screening involves taking a blood sample to check your fasting blood sugar level. This should be done once every 3 years, after age 67, if you are within normal weight and without risk factors for diabetes. Testing should be considered at a younger age or be carried out more frequently if you are overweight and have at least 1 risk factor for diabetes.  Breast cancer screening is essential preventive care for women. You should practice "breast self-awareness." This means understanding the normal appearance and feel of your breasts and may include breast self-examination. Any changes detected, no matter how small, should be reported to a health care provider. Women in their 8s and 30s should have a clinical breast exam (CBE) by a health care provider as part of a regular health exam every 1 to 3 years. After age 70, women should have a CBE every year. Starting at age 25, women should consider having a mammogram (breast X-ray test) every year. Women who have a family history of breast cancer should talk to their health care provider about genetic screening. Women at a high risk of breast cancer should talk to their health care providers about having an MRI and a mammogram every year.  Breast cancer gene (BRCA)-related cancer risk assessment is recommended for women who have family members with BRCA-related cancers. BRCA-related cancers include breast, ovarian, tubal, and peritoneal cancers. Having family members with these cancers may be associated with an increased risk for harmful changes (mutations) in the breast cancer genes BRCA1 and BRCA2. Results of the assessment will determine the need for genetic counseling and  BRCA1 and BRCA2 testing.  Routine pelvic exams to screen for cancer are no longer recommended for nonpregnant women who are considered low risk for cancer of the pelvic organs (ovaries, uterus, and vagina) and who do not have symptoms. Ask your health care provider if a screening pelvic exam is right for you.  If you have had past treatment for cervical cancer or a condition that could lead to cancer, you need Pap tests and screening for cancer for at least 20 years after your treatment. If Pap tests have been discontinued, your risk factors (such as having a new sexual partner) need to be reassessed to determine if screening should be resumed. Some women have medical problems that increase the chance of getting cervical cancer. In these cases, your health care provider may recommend more frequent screening and Pap tests.  The HPV test is an additional test that may be used for cervical cancer screening. The HPV test looks for the virus that can cause the cell changes on the cervix. The cells collected during the Pap test can be  tested for HPV. The HPV test could be used to screen women aged 30 years and older, and should be used in women of any age who have unclear Pap test results. After the age of 30, women should have HPV testing at the same frequency as a Pap test.  Colorectal cancer can be detected and often prevented. Most routine colorectal cancer screening begins at the age of 50 years and continues through age 75 years. However, your health care provider may recommend screening at an earlier age if you have risk factors for colon cancer. On a yearly basis, your health care provider may provide home test kits to check for hidden blood in the stool. Use of a small camera at the end of a tube, to directly examine the colon (sigmoidoscopy or colonoscopy), can detect the earliest forms of colorectal cancer. Talk to your health care provider about this at age 50, when routine screening begins. Direct  exam of the colon should be repeated every 5-10 years through age 75 years, unless early forms of pre-cancerous polyps or small growths are found.  People who are at an increased risk for hepatitis B should be screened for this virus. You are considered at high risk for hepatitis B if:  You were born in a country where hepatitis B occurs often. Talk with your health care provider about which countries are considered high risk.  Your parents were born in a high-risk country and you have not received a shot to protect against hepatitis B (hepatitis B vaccine).  You have HIV or AIDS.  You use needles to inject street drugs.  You live with, or have sex with, someone who has hepatitis B.  You get hemodialysis treatment.  You take certain medicines for conditions like cancer, organ transplantation, and autoimmune conditions.  Hepatitis C blood testing is recommended for all people born from 1945 through 1965 and any individual with known risks for hepatitis C.  Practice safe sex. Use condoms and avoid high-risk sexual practices to reduce the spread of sexually transmitted infections (STIs). STIs include gonorrhea, chlamydia, syphilis, trichomonas, herpes, HPV, and human immunodeficiency virus (HIV). Herpes, HIV, and HPV are viral illnesses that have no cure. They can result in disability, cancer, and death.  You should be screened for sexually transmitted illnesses (STIs) including gonorrhea and chlamydia if:  You are sexually active and are younger than 24 years.  You are older than 24 years and your health care provider tells you that you are at risk for this type of infection.  Your sexual activity has changed since you were last screened and you are at an increased risk for chlamydia or gonorrhea. Ask your health care provider if you are at risk.  If you are at risk of being infected with HIV, it is recommended that you take a prescription medicine daily to prevent HIV infection. This is  called preexposure prophylaxis (PrEP). You are considered at risk if:  You are a heterosexual woman, are sexually active, and are at increased risk for HIV infection.  You take drugs by injection.  You are sexually active with a partner who has HIV.  Talk with your health care provider about whether you are at high risk of being infected with HIV. If you choose to begin PrEP, you should first be tested for HIV. You should then be tested every 3 months for as long as you are taking PrEP.  Osteoporosis is a disease in which the bones lose minerals and strength   with aging. This can result in serious bone fractures or breaks. The risk of osteoporosis can be identified using a bone density scan. Women ages 65 years and over and women at risk for fractures or osteoporosis should discuss screening with their health care providers. Ask your health care provider whether you should take a calcium supplement or vitamin D to reduce the rate of osteoporosis.  Menopause can be associated with physical symptoms and risks. Hormone replacement therapy is available to decrease symptoms and risks. You should talk to your health care provider about whether hormone replacement therapy is right for you.  Use sunscreen. Apply sunscreen liberally and repeatedly throughout the day. You should seek shade when your shadow is shorter than you. Protect yourself by wearing long sleeves, pants, a wide-brimmed hat, and sunglasses year round, whenever you are outdoors.  Once a month, do a whole body skin exam, using a mirror to look at the skin on your back. Tell your health care provider of new moles, moles that have irregular borders, moles that are larger than a pencil eraser, or moles that have changed in shape or color.  Stay current with required vaccines (immunizations).  Influenza vaccine. All adults should be immunized every year.  Tetanus, diphtheria, and acellular pertussis (Td, Tdap) vaccine. Pregnant women should  receive 1 dose of Tdap vaccine during each pregnancy. The dose should be obtained regardless of the length of time since the last dose. Immunization is preferred during the 27th-36th week of gestation. An adult who has not previously received Tdap or who does not know her vaccine status should receive 1 dose of Tdap. This initial dose should be followed by tetanus and diphtheria toxoids (Td) booster doses every 10 years. Adults with an unknown or incomplete history of completing a 3-dose immunization series with Td-containing vaccines should begin or complete a primary immunization series including a Tdap dose. Adults should receive a Td booster every 10 years.  Varicella vaccine. An adult without evidence of immunity to varicella should receive 2 doses or a second dose if she has previously received 1 dose. Pregnant females who do not have evidence of immunity should receive the first dose after pregnancy. This first dose should be obtained before leaving the health care facility. The second dose should be obtained 4-8 weeks after the first dose.  Human papillomavirus (HPV) vaccine. Females aged 13-26 years who have not received the vaccine previously should obtain the 3-dose series. The vaccine is not recommended for use in pregnant females. However, pregnancy testing is not needed before receiving a dose. If a female is found to be pregnant after receiving a dose, no treatment is needed. In that case, the remaining doses should be delayed until after the pregnancy. Immunization is recommended for any person with an immunocompromised condition through the age of 26 years if she did not get any or all doses earlier. During the 3-dose series, the second dose should be obtained 4-8 weeks after the first dose. The third dose should be obtained 24 weeks after the first dose and 16 weeks after the second dose.  Zoster vaccine. One dose is recommended for adults aged 60 years or older unless certain conditions are  present.  Measles, mumps, and rubella (MMR) vaccine. Adults born before 1957 generally are considered immune to measles and mumps. Adults born in 1957 or later should have 1 or more doses of MMR vaccine unless there is a contraindication to the vaccine or there is laboratory evidence of immunity to   each of the three diseases. A routine second dose of MMR vaccine should be obtained at least 28 days after the first dose for students attending postsecondary schools, health care workers, or international travelers. People who received inactivated measles vaccine or an unknown type of measles vaccine during 1963-1967 should receive 2 doses of MMR vaccine. People who received inactivated mumps vaccine or an unknown type of mumps vaccine before 1979 and are at high risk for mumps infection should consider immunization with 2 doses of MMR vaccine. For females of childbearing age, rubella immunity should be determined. If there is no evidence of immunity, females who are not pregnant should be vaccinated. If there is no evidence of immunity, females who are pregnant should delay immunization until after pregnancy. Unvaccinated health care workers born before 1957 who lack laboratory evidence of measles, mumps, or rubella immunity or laboratory confirmation of disease should consider measles and mumps immunization with 2 doses of MMR vaccine or rubella immunization with 1 dose of MMR vaccine.  Pneumococcal 13-valent conjugate (PCV13) vaccine. When indicated, a person who is uncertain of her immunization history and has no record of immunization should receive the PCV13 vaccine. An adult aged 19 years or older who has certain medical conditions and has not been previously immunized should receive 1 dose of PCV13 vaccine. This PCV13 should be followed with a dose of pneumococcal polysaccharide (PPSV23) vaccine. The PPSV23 vaccine dose should be obtained at least 8 weeks after the dose of PCV13 vaccine. An adult aged 19  years or older who has certain medical conditions and previously received 1 or more doses of PPSV23 vaccine should receive 1 dose of PCV13. The PCV13 vaccine dose should be obtained 1 or more years after the last PPSV23 vaccine dose.  Pneumococcal polysaccharide (PPSV23) vaccine. When PCV13 is also indicated, PCV13 should be obtained first. All adults aged 65 years and older should be immunized. An adult younger than age 65 years who has certain medical conditions should be immunized. Any person who resides in a nursing home or long-term care facility should be immunized. An adult smoker should be immunized. People with an immunocompromised condition and certain other conditions should receive both PCV13 and PPSV23 vaccines. People with human immunodeficiency virus (HIV) infection should be immunized as soon as possible after diagnosis. Immunization during chemotherapy or radiation therapy should be avoided. Routine use of PPSV23 vaccine is not recommended for American Indians, [place] Natives, or people younger than 65 years unless there are medical conditions that require PPSV23 vaccine. When indicated, people who have unknown immunization and have no record of immunization should receive PPSV23 vaccine. One-time revaccination 5 years after the first dose of PPSV23 is recommended for people aged 19-64 years who have chronic kidney failure, nephrotic syndrome, asplenia, or immunocompromised conditions. People who received 1-2 doses of PPSV23 before age 65 years should receive another dose of PPSV23 vaccine at age 65 years or later if at least 5 years have passed since the previous dose. Doses of PPSV23 are not needed for people immunized with PPSV23 at or after age 65 years.  Meningococcal vaccine. Adults with asplenia or persistent complement component deficiencies should receive 2 doses of quadrivalent meningococcal conjugate (MenACWY-D) vaccine. The doses should be obtained at least 2 months apart.  Microbiologists working with certain meningococcal bacteria, military recruits, people at risk during an outbreak, and people who travel to or live in countries with a high rate of meningitis should be immunized. A first-year college student up through age   21 years who is living in a residence hall should receive a dose if she did not receive a dose on or after her 16th birthday. Adults who have certain high-risk conditions should receive one or more doses of vaccine.  Hepatitis A vaccine. Adults who wish to be protected from this disease, have certain high-risk conditions, work with hepatitis A-infected animals, work in hepatitis A research labs, or travel to or work in countries with a high rate of hepatitis A should be immunized. Adults who were previously unvaccinated and who anticipate close contact with an international adoptee during the first 60 days after arrival in the [place] from a country with a high rate of hepatitis A should be immunized.  Hepatitis B vaccine. Adults who wish to be protected from this disease, have certain high-risk conditions, may be exposed to blood or other infectious body fluids, are household contacts or sex partners of hepatitis B positive people, are clients or workers in certain care facilities, or travel to or work in countries with a high rate of hepatitis B should be immunized.  Haemophilus influenzae type b (Hib) vaccine. A previously unvaccinated person with asplenia or sickle cell disease or having a scheduled splenectomy should receive 1 dose of Hib vaccine. Regardless of previous immunization, a recipient of a hematopoietic stem cell transplant should receive a 3-dose series 6-12 months after her successful transplant. Hib vaccine is not recommended for adults with HIV infection. Preventive Services / Frequency Ages 64 to 68 years  Blood pressure check.** / Every 1 to 2 years.  Lipid and cholesterol check.** / Every 5 years beginning at age  22.  Clinical breast exam.** / Every 3 years for women in their 88s and 53s.  BRCA-related cancer risk assessment.** / For women who have family members with a BRCA-related cancer (breast, ovarian, tubal, or peritoneal cancers).  Pap test.** / Every 2 years from ages 90 through 51. Every 3 years starting at age 21 through age 56 or 3 with a history of 3 consecutive normal Pap tests.  HPV screening.** / Every 3 years from ages 24 through ages 1 to 46 with a history of 3 consecutive normal Pap tests.  Hepatitis C blood test.** / For any individual with known risks for hepatitis C.  Skin self-exam. / Monthly.  Influenza vaccine. / Every year.  Tetanus, diphtheria, and acellular pertussis (Tdap, Td) vaccine.** / Consult your health care provider. Pregnant women should receive 1 dose of Tdap vaccine during each pregnancy. 1 dose of Td every 10 years.  Varicella vaccine.** / Consult your health care provider. Pregnant females who do not have evidence of immunity should receive the first dose after pregnancy.  HPV vaccine. / 3 doses over 6 months, if 72 and younger. The vaccine is not recommended for use in pregnant females. However, pregnancy testing is not needed before receiving a dose.  Measles, mumps, rubella (MMR) vaccine.** / You need at least 1 dose of MMR if you were born in 1957 or later. You may also need a 2nd dose. For females of childbearing age, rubella immunity should be determined. If there is no evidence of immunity, females who are not pregnant should be vaccinated. If there is no evidence of immunity, females who are pregnant should delay immunization until after pregnancy.  Pneumococcal 13-valent conjugate (PCV13) vaccine.** / Consult your health care provider.  Pneumococcal polysaccharide (PPSV23) vaccine.** / 1 to 2 doses if you smoke cigarettes or if you have certain conditions.  Meningococcal vaccine.** /  1 dose if you are age 19 to 21 years and a first-year college  student living in a residence hall, or have one of several medical conditions, you need to get vaccinated against meningococcal disease. You may also need additional booster doses.  Hepatitis A vaccine.** / Consult your health care provider.  Hepatitis B vaccine.** / Consult your health care provider.  Haemophilus influenzae type b (Hib) vaccine.** / Consult your health care provider. Ages 40 to 64 years  Blood pressure check.** / Every 1 to 2 years.  Lipid and cholesterol check.** / Every 5 years beginning at age 20 years.  Lung cancer screening. / Every year if you are aged 55-80 years and have a 30-pack-year history of smoking and currently smoke or have quit within the past 15 years. Yearly screening is stopped once you have quit smoking for at least 15 years or develop a health problem that would prevent you from having lung cancer treatment.  Clinical breast exam.** / Every year after age 40 years.  BRCA-related cancer risk assessment.** / For women who have family members with a BRCA-related cancer (breast, ovarian, tubal, or peritoneal cancers).  Mammogram.** / Every year beginning at age 40 years and continuing for as long as you are in good health. Consult with your health care provider.  Pap test.** / Every 3 years starting at age 30 years through age 65 or 70 years with a history of 3 consecutive normal Pap tests.  HPV screening.** / Every 3 years from ages 30 years through ages 65 to 70 years with a history of 3 consecutive normal Pap tests.  Fecal occult blood test (FOBT) of stool. / Every year beginning at age 50 years and continuing until age 75 years. You may not need to do this test if you get a colonoscopy every 10 years.  Flexible sigmoidoscopy or colonoscopy.** / Every 5 years for a flexible sigmoidoscopy or every 10 years for a colonoscopy beginning at age 50 years and continuing until age 75 years.  Hepatitis C blood test.** / For all people born from 1945 through  1965 and any individual with known risks for hepatitis C.  Skin self-exam. / Monthly.  Influenza vaccine. / Every year.  Tetanus, diphtheria, and acellular pertussis (Tdap/Td) vaccine.** / Consult your health care provider. Pregnant women should receive 1 dose of Tdap vaccine during each pregnancy. 1 dose of Td every 10 years.  Varicella vaccine.** / Consult your health care provider. Pregnant females who do not have evidence of immunity should receive the first dose after pregnancy.  Zoster vaccine.** / 1 dose for adults aged 60 years or older.  Measles, mumps, rubella (MMR) vaccine.** / You need at least 1 dose of MMR if you were born in 1957 or later. You may also need a 2nd dose. For females of childbearing age, rubella immunity should be determined. If there is no evidence of immunity, females who are not pregnant should be vaccinated. If there is no evidence of immunity, females who are pregnant should delay immunization until after pregnancy.  Pneumococcal 13-valent conjugate (PCV13) vaccine.** / Consult your health care provider.  Pneumococcal polysaccharide (PPSV23) vaccine.** / 1 to 2 doses if you smoke cigarettes or if you have certain conditions.  Meningococcal vaccine.** / Consult your health care provider.  Hepatitis A vaccine.** / Consult your health care provider.  Hepatitis B vaccine.** / Consult your health care provider.  Haemophilus influenzae type b (Hib) vaccine.** / Consult your health care provider. Ages 65   years and over  Blood pressure check.** / Every 1 to 2 years.  Lipid and cholesterol check.** / Every 5 years beginning at age 22 years.  Lung cancer screening. / Every year if you are aged 73-80 years and have a 30-pack-year history of smoking and currently smoke or have quit within the past 15 years. Yearly screening is stopped once you have quit smoking for at least 15 years or develop a health problem that would prevent you from having lung cancer  treatment.  Clinical breast exam.** / Every year after age 4 years.  BRCA-related cancer risk assessment.** / For women who have family members with a BRCA-related cancer (breast, ovarian, tubal, or peritoneal cancers).  Mammogram.** / Every year beginning at age 40 years and continuing for as long as you are in good health. Consult with your health care provider.  Pap test.** / Every 3 years starting at age 9 years through age 34 or 91 years with 3 consecutive normal Pap tests. Testing can be stopped between 65 and 70 years with 3 consecutive normal Pap tests and no abnormal Pap or HPV tests in the past 10 years.  HPV screening.** / Every 3 years from ages 57 years through ages 64 or 45 years with a history of 3 consecutive normal Pap tests. Testing can be stopped between 65 and 70 years with 3 consecutive normal Pap tests and no abnormal Pap or HPV tests in the past 10 years.  Fecal occult blood test (FOBT) of stool. / Every year beginning at age 15 years and continuing until age 17 years. You may not need to do this test if you get a colonoscopy every 10 years.  Flexible sigmoidoscopy or colonoscopy.** / Every 5 years for a flexible sigmoidoscopy or every 10 years for a colonoscopy beginning at age 86 years and continuing until age 71 years.  Hepatitis C blood test.** / For all people born from [birth year] through 1964 and any individual with known risks for hepatitis C.  Osteoporosis screening.** / A one-time screening for women ages 83 years and over and women at risk for fractures or osteoporosis.  Skin self-exam. / Monthly.  Influenza vaccine. / Every year.  Tetanus, diphtheria, and acellular pertussis (Tdap/Td) vaccine.** / 1 dose of Td every 10 years.  Varicella vaccine.** / Consult your health care provider.  Zoster vaccine.** / 1 dose for adults aged 61 years or older.  Pneumococcal 13-valent conjugate (PCV13) vaccine.** / Consult your health care provider.  Pneumococcal  polysaccharide (PPSV23) vaccine.** / 1 dose for all adults aged 28 years and older.  Meningococcal vaccine.** / Consult your health care provider.  Hepatitis A vaccine.** / Consult your health care provider.  Hepatitis B vaccine.** / Consult your health care provider.  Haemophilus influenzae type b (Hib) vaccine.** / Consult your health care provider. ** Family history and personal history of risk and conditions may change your health care provider's recommendations. Document Released: 11/15/2001 Document Revised: 02/03/2014 Document Reviewed: 02/14/2011 [facility] Patient Information 2015 [place], [place]. This information is not intended to replace advice given to you by your health care provider. Make sure you discuss any questions you have with your health care provider.

## 2014-05-21 NOTE — Addendum Note (Signed)
Addended by: Rosalita Chessman on: 05/21/2014 09:25 AM   Modules accepted: Orders

## 2014-05-21 NOTE — Progress Notes (Signed)
Subjective:     Stephanie Mcconnell is a 43 y.o. female and is here for a comprehensive physical exam. The patient reports problems - seasonal allergies, vaginal yeast from being at beach.  History   Social History  . Marital Status: Married    Spouse Name: N/A    Number of Children: N/A  . Years of Education: N/A   Occupational History  . Not on file.   Social History Main Topics  . Smoking status: Never Smoker   . Smokeless tobacco: Not on file  . Alcohol Use: 0.6 oz/week    1 Glasses of wine per week     Comment: rarely  . Drug Use: No  . Sexual Activity: Yes    Partners: Male   Other Topics Concern  . Not on file   Social History Narrative   Exercise-- no-- but will start   Health Maintenance  Topic Date Due  . Influenza Vaccine  07/21/2014 (Originally 05/03/2014)  . Pap Smear  01/01/2017  . Tetanus/tdap  10/03/2018     The following portions of the patient's history were reviewed and updated as appropriate:  She  has a past medical history of Tuberculosis and Bulging disc. She  does not have any pertinent problems on file. She  has past surgical history that includes Ventral hernia repair (03/12/2012); Mass excision (03/12/2012); Hernia repair; and Endoscopic vein laser treatment. Her family history is not on file. She  reports that she has never smoked. She does not have any smokeless tobacco history on file. She reports that she drinks about .6 ounces of alcohol per week. She reports that she does not use illicit drugs. She has a current medication list which includes the following prescription(s): fluticasone, multivitamin, phentermine, fluconazole, and levocetirizine. Current Outpatient Prescriptions on File Prior to Visit  Medication Sig Dispense Refill  . fluticasone (FLONASE) 50 MCG/ACT nasal spray Place 2 sprays into both nostrils daily.  16 g  6  . Multiple Vitamin (MULTIVITAMIN) capsule Take 1 capsule by mouth daily.      . phentermine 37.5 MG capsule Take  37.5 mg by mouth every morning.       No current facility-administered medications on file prior to visit.   She has No Known Allergies.. Review of Systems Review of Systems  Constitutional: Negative for activity change, appetite change and fatigue.  HENT: Negative for hearing loss, congestion, tinnitus and ear discharge.  dentist q24m Eyes: Negative for visual disturbance (see optho q1y -- vision corrected to 20/20 with glasses).  Respiratory: Negative for cough, chest tightness and shortness of breath.   Cardiovascular: Negative for chest pain, palpitations and leg swelling.  Gastrointestinal: Negative for abdominal pain, diarrhea, constipation and abdominal distention.  Genitourinary: Negative for urgency, frequency, decreased urine volume and difficulty urinating.  Musculoskeletal: Negative for back pain, arthralgias and gait problem.  Skin: Negative for color change, pallor and rash.  Neurological: Negative for dizziness, light-headedness, numbness and headaches.  Hematological: Negative for adenopathy. Does not bruise/bleed easily.  Psychiatric/Behavioral: Negative for suicidal ideas, confusion, sleep disturbance, self-injury, dysphoric mood, decreased concentration and agitation.      Objective:    BP 118/72  Pulse 90  Temp(Src) 98.1 F (36.7 C) (Oral)  Ht 5' 5.5" (1.664 m)  Wt 280 lb (127.007 kg)  BMI 45.87 kg/m2  SpO2 98% General appearance: alert, cooperative, appears stated age and no distress Head: Normocephalic, without obvious abnormality, atraumatic Eyes: conjunctivae/corneas clear. PERRL, EOM's intact. Fundi benign. Ears: normal TM's and external  ear canals both ears Nose: Nares normal. Septum midline. Mucosa normal. No drainage or sinus tenderness. Throat: lips, mucosa, and tongue normal; teeth and gums normal Neck: no adenopathy, supple, symmetrical, trachea midline and thyroid not enlarged, symmetric, no tenderness/mass/nodules Back: symmetric, no curvature.  ROM normal. No CVA tenderness. Lungs: clear to auscultation bilaterally Breasts: gyn Heart: regular rate and rhythm, S1, S2 normal, no murmur, click, rub or gallop Abdomen: soft, non-tender; bowel sounds normal; no masses,  no organomegaly Pelvic: deferred--gyn Extremities: extremities normal, atraumatic, no cyanosis or edema Pulses: 2+ and symmetric Skin: Skin color, texture, turgor normal. No rashes or lesions Lymph nodes: Cervical, supraclavicular, and axillary nodes normal. Neurologic: Alert and oriented X 3, normal strength and tone. Normal symmetric reflexes. Normal coordination and gait Psych-no depression, no anxiety      Assessment:    Healthy female exam.      Plan:    ghm utd Check labs See After Visit Summary for Counseling Recommendations   1. Vaginal yeast infection Pt will need to have pelvic exam if symptoms do not improve - fluconazole (DIFLUCAN) 150 MG tablet; Take 1 tablet (150 mg total) by mouth once.  Dispense: 1 tablet; Refill: 2  2. Seasonal allergies Change to xyzal and con't flonase - levocetirizine (XYZAL) 5 MG tablet; Take 1 tablet (5 mg total) by mouth every evening.  Dispense: 30 tablet; Refill: 11  3. Severe obesity (BMI >= 40) Discussed diet and exercise - phentermine 37.5 MG capsule; Take 1 capsule (37.5 mg total) by mouth every morning.  Dispense: 30 capsule; Refill: 0  4. Preventative health care

## 2014-05-21 NOTE — Progress Notes (Signed)
Pre visit review using our clinic review tool, if applicable. No additional management support is needed unless otherwise documented below in the visit note. 

## 2014-12-05 ENCOUNTER — Ambulatory Visit (INDEPENDENT_AMBULATORY_CARE_PROVIDER_SITE_OTHER): Payer: 59 | Admitting: Family Medicine

## 2014-12-05 ENCOUNTER — Encounter: Payer: Self-pay | Admitting: Family Medicine

## 2014-12-05 VITALS — BP 116/80 | HR 78 | Temp 98.2°F | Wt 281.4 lb

## 2014-12-05 DIAGNOSIS — J018 Other acute sinusitis: Secondary | ICD-10-CM

## 2014-12-05 DIAGNOSIS — J029 Acute pharyngitis, unspecified: Secondary | ICD-10-CM

## 2014-12-05 DIAGNOSIS — J302 Other seasonal allergic rhinitis: Secondary | ICD-10-CM

## 2014-12-05 LAB — POCT RAPID STREP A (OFFICE): Rapid Strep A Screen: NEGATIVE

## 2014-12-05 MED ORDER — AMOXICILLIN 875 MG PO TABS: 875.0000 mg | ORAL_TABLET | Freq: Two times a day (BID) | ORAL | Status: DC

## 2014-12-05 MED ORDER — LEVOCETIRIZINE DIHYDROCHLORIDE 5 MG PO TABS: 5.0000 mg | ORAL_TABLET | Freq: Every evening | ORAL | Status: DC

## 2014-12-05 MED ORDER — FLUTICASONE PROPIONATE 50 MCG/ACT NA SUSP: 2.0000 | Freq: Every day | NASAL | Status: DC

## 2014-12-05 NOTE — Progress Notes (Signed)
Pre visit review using our clinic review tool, if applicable. No additional management support is needed unless otherwise documented below in the visit note. 

## 2014-12-05 NOTE — Patient Instructions (Signed)

## 2014-12-05 NOTE — Progress Notes (Signed)
Subjective:    Patient ID: Stephanie Mcconnell, female    DOB: 1971-09-08, 44 y.o.   MRN: [id number]  HPI  Patient here for sore throat x 7 days.   Past Medical History  Diagnosis Date  . Tuberculosis     Exposed to TB 2003 and recived tx but never dx'd  . Bulging disc     lower back.     Review of Systems  Constitutional: Positive for chills. Negative for fever.  HENT: Positive for congestion, ear pain, postnasal drip, rhinorrhea, sinus pressure and sore throat.   Respiratory: Positive for cough and wheezing. Negative for chest tightness and shortness of breath.   Cardiovascular: Negative for chest pain, palpitations and leg swelling.  Allergic/Immunologic: Positive for environmental allergies. Negative for food allergies.  Psychiatric/Behavioral: Negative for dysphoric mood and decreased concentration. The patient is not nervous/anxious.     Current Outpatient Prescriptions on File Prior to Visit  Medication Sig Dispense Refill  . Multiple Vitamin (MULTIVITAMIN) capsule Take 1 capsule by mouth daily.     No current facility-administered medications on file prior to visit.       Objective:    Physical Exam  Constitutional: She is oriented to person, place, and time. She appears well-developed and well-nourished. No distress.  HENT:  Head: Normocephalic and atraumatic.  Right Ear: A middle ear effusion is present.  Left Ear: A middle ear effusion is present.  Nose: Mucosal edema and rhinorrhea present.  Mouth/Throat: Uvula is midline and mucous membranes are normal. Posterior oropharyngeal erythema present. No oropharyngeal exudate.  Eyes: Conjunctivae and EOM are normal. Pupils are equal, round, and reactive to light.  Neck: Normal range of motion. Neck supple.  Cardiovascular: Normal rate, regular rhythm and normal heart sounds.   Pulmonary/Chest: Effort normal. No respiratory distress. She has wheezes.  Lymphadenopathy:    She has cervical adenopathy.  Neurological:  She is alert and oriented to person, place, and time.  Psychiatric: She has a normal mood and affect. Her behavior is normal. Judgment and thought content normal.    BP 116/80 mmHg  Pulse 78  Temp(Src) 98.2 F (36.8 C) (Oral)  Wt 281 lb 6.4 oz (127.642 kg)  SpO2 98%  LMP 11/03/2014 (Within Days) Wt Readings from Last 3 Encounters:  12/05/14 281 lb 6.4 oz (127.642 kg)  05/21/14 280 lb (127.007 kg)  03/11/14 276 lb (125.193 kg)     Lab Results  Component Value Date   WBC 6.6 05/21/2014   HGB 13.0 05/21/2014   HCT 39.2 05/21/2014   PLT 245.0 05/21/2014   GLUCOSE 79 05/21/2014   CHOL 168 05/21/2014   TRIG 128.0 05/21/2014   HDL 59.30 05/21/2014   LDLCALC 83 05/21/2014   ALT 19 05/21/2014   AST 21 05/21/2014   NA 137 05/21/2014   K 3.6 05/21/2014   CL 102 05/21/2014   CREATININE 0.7 05/21/2014   BUN 9 05/21/2014   CO2 30 05/21/2014   TSH 0.43 05/21/2014       Assessment & Plan:  I have discontinued Stephanie Mcconnell's fluconazole and phentermine. I am also having her start on amoxicillin. Additionally, I am having her maintain her multivitamin, levocetirizine, and fluticasone.  Meds ordered this encounter  Medications  . levocetirizine (XYZAL) 5 MG tablet    Sig: Take 1 tablet (5 mg total) by mouth every evening.    Dispense:  30 tablet    Refill:  11  . fluticasone (FLONASE) 50 MCG/ACT nasal spray  Sig: Place 2 sprays into both nostrils daily.    Dispense:  16 g    Refill:  6  . amoxicillin (AMOXIL) 875 MG tablet    Sig: Take 1 tablet (875 mg total) by mouth 2 (two) times daily.    Dispense:  20 tablet    Refill:  0    Problem List Items Addressed This Visit      Unprioritized   Acute pharyngitis    Amoxil xyzal flonase       Other Visit Diagnoses    Sore throat    -  Primary    Relevant Medications    amoxicillin (AMOXIL) tablet    Other Relevant Orders    POCT rapid strep A    Seasonal allergies        Relevant Medications    levocetirizine  (XYZAL) tablet 5 mg    Other acute sinusitis        Relevant Medications    levocetirizine (XYZAL) tablet 5 mg    fluticasone (FLONASE) 50 MCG/ACT nasal spray    amoxicillin (AMOXIL) tablet        Garnet Koyanagi, DO

## 2014-12-05 NOTE — Assessment & Plan Note (Signed)
Amoxil xyzal flonase

## 2014-12-23 ENCOUNTER — Other Ambulatory Visit (HOSPITAL_COMMUNITY): Payer: Self-pay | Admitting: Obstetrics & Gynecology

## 2014-12-23 DIAGNOSIS — Z1231 Encounter for screening mammogram for malignant neoplasm of breast: Secondary | ICD-10-CM

## 2014-12-26 ENCOUNTER — Ambulatory Visit (HOSPITAL_COMMUNITY)
Admission: RE | Admit: 2014-12-26 | Discharge: 2014-12-26 | Disposition: A | Discharge status: Home / Self Care | Payer: 59 | Source: Ambulatory Visit | Attending: Obstetrics & Gynecology | Admitting: Obstetrics & Gynecology

## 2014-12-26 DIAGNOSIS — Z1231 Encounter for screening mammogram for malignant neoplasm of breast: Secondary | ICD-10-CM | POA: Insufficient documentation

## 2014-12-30 ENCOUNTER — Other Ambulatory Visit: Payer: Self-pay | Admitting: Obstetrics & Gynecology

## 2014-12-30 DIAGNOSIS — R928 Other abnormal and inconclusive findings on diagnostic imaging of breast: Secondary | ICD-10-CM

## 2014-12-31 ENCOUNTER — Ambulatory Visit
Admission: RE | Admit: 2014-12-31 | Discharge: 2014-12-31 | Disposition: A | Discharge status: Home / Self Care | Payer: 59 | Source: Ambulatory Visit | Attending: Obstetrics & Gynecology | Admitting: Obstetrics & Gynecology

## 2014-12-31 DIAGNOSIS — R928 Other abnormal and inconclusive findings on diagnostic imaging of breast: Secondary | ICD-10-CM

## 2015-05-22 ENCOUNTER — Telehealth: Payer: Self-pay | Admitting: Family Medicine

## 2015-05-22 ENCOUNTER — Telehealth: Payer: Self-pay | Admitting: Behavioral Health

## 2015-05-22 NOTE — Telephone Encounter (Signed)
pre visit letter mailed 05/04/15

## 2015-05-22 NOTE — Telephone Encounter (Signed)
Unable to reach patient at time of Pre-Visit Call.  Left message for patient to return call when available.    

## 2015-05-25 ENCOUNTER — Encounter: Payer: Self-pay | Admitting: Family Medicine

## 2015-05-25 ENCOUNTER — Ambulatory Visit (INDEPENDENT_AMBULATORY_CARE_PROVIDER_SITE_OTHER): Payer: 59 | Admitting: Family Medicine

## 2015-05-25 VITALS — BP 120/84 | HR 80 | Temp 98.4°F | Ht 66.0 in | Wt 289.2 lb

## 2015-05-25 DIAGNOSIS — I8390 Asymptomatic varicose veins of unspecified lower extremity: Secondary | ICD-10-CM

## 2015-05-25 DIAGNOSIS — Z0000 Encounter for general adult medical examination without abnormal findings: Secondary | ICD-10-CM | POA: Diagnosis not present

## 2015-05-25 LAB — COMPREHENSIVE METABOLIC PANEL
ALBUMIN: 4.2 g/dL (ref 3.5–5.2)
ALT: 17 U/L (ref 0–35)
AST: 20 U/L (ref 0–37)
Alkaline Phosphatase: 48 U/L (ref 39–117)
BUN: 13 mg/dL (ref 6–23)
CALCIUM: 9.4 mg/dL (ref 8.4–10.5)
CHLORIDE: 103 meq/L (ref 96–112)
CO2: 27 meq/L (ref 19–32)
Creatinine, Ser: 0.67 mg/dL (ref 0.40–1.20)
GFR: 123.27 mL/min (ref >60.00)
Glucose, Bld: 93 mg/dL (ref 70–99)
Potassium: 3.8 mEq/L (ref 3.5–5.1)
Sodium: 138 mEq/L (ref 135–145)
Total Bilirubin: 0.5 mg/dL (ref 0.2–1.2)
Total Protein: 7.3 g/dL (ref 6.0–8.3)

## 2015-05-25 LAB — CBC
HEMATOCRIT: 40.7 % (ref 36.0–46.0)
HEMOGLOBIN: 13.7 g/dL (ref 12.0–15.0)
MCHC: 33.6 g/dL (ref 30.0–36.0)
MCV: 83.1 fl (ref 78.0–100.0)
PLATELETS: 219 10*3/uL (ref 150.0–400.0)
RBC: 4.9 Mil/uL (ref 3.87–5.11)
RDW: 16.1 % — ABNORMAL HIGH (ref 11.5–15.5)
WBC: 5.5 10*3/uL (ref 4.0–10.5)

## 2015-05-25 LAB — LIPID PANEL
CHOL/HDL RATIO: 3
CHOLESTEROL: 175 mg/dL (ref 0–200)
HDL: 55.6 mg/dL (ref >39.00)
LDL CALC: 105 mg/dL — AB (ref 0–99)
NonHDL: 119.17
TRIGLYCERIDES: 72 mg/dL (ref 0.0–149.0)
VLDL: 14.4 mg/dL (ref 0.0–40.0)

## 2015-05-25 LAB — POCT URINALYSIS DIPSTICK
BILIRUBIN UA: NEGATIVE
GLUCOSE UA: NEGATIVE
Ketones, UA: NEGATIVE
Leukocytes, UA: NEGATIVE
NITRITE UA: NEGATIVE
Protein, UA: NEGATIVE
RBC UA: NEGATIVE
SPEC GRAV UA: 1.02
UROBILINOGEN UA: 2
pH, UA: 6

## 2015-05-25 LAB — TSH: TSH: 1.02 u[IU]/mL (ref 0.35–4.50)

## 2015-05-25 LAB — HIV ANTIBODY (ROUTINE TESTING W REFLEX): HIV: NONREACTIVE

## 2015-05-25 MED ORDER — NONFORMULARY OR COMPOUNDED ITEM: Status: DC

## 2015-05-25 NOTE — Progress Notes (Signed)
Subjective:     Stephanie Mcconnell is a 44 y.o. female and is here for a comprehensive physical exam. The patient reports no problems.  Social History   Social History  . Marital Status: Married    Spouse Name: N/A  . Number of Children: N/A  . Years of Education: N/A   Occupational History  . Not on file.   Social History Main Topics  . Smoking status: Never Smoker   . Smokeless tobacco: Not on file  . Alcohol Use: 0.6 oz/week    1 Glasses of wine per week     Comment: rarely  . Drug Use: No  . Sexual Activity:    Partners: Male   Other Topics Concern  . Not on file   Social History Narrative   Exercise-- no-- but will start   Health Maintenance  Topic Date Due  . INFLUENZA VACCINE  12/05/2015 (Originally 05/04/2015)  . HIV Screening  12/05/2015 (Originally 12/06/1986)  . MAMMOGRAM  12/26/2015  . TETANUS/TDAP  10/03/2018    The following portions of the patient's history were reviewed and updated as appropriate:  She  has a past medical history of Tuberculosis and Bulging disc. She  does not have any pertinent problems on file. She  has past surgical history that includes Ventral hernia repair (03/12/2012); Mass excision (03/12/2012); Hernia repair; and Endoscopic vein laser treatment. Her family history is not on file. She  reports that she has never smoked. She does not have any smokeless tobacco history on file. She reports that she drinks about 0.6 oz of alcohol per week. She reports that she does not use illicit drugs. She has a current medication list which includes the following prescription(s): fluticasone, levocetirizine, multivitamin, and NONFORMULARY OR COMPOUNDED ITEM. Current Outpatient Prescriptions on File Prior to Visit  Medication Sig Dispense Refill  . fluticasone (FLONASE) 50 MCG/ACT nasal spray Place 2 sprays into both nostrils daily. 16 g 6  . levocetirizine (XYZAL) 5 MG tablet Take 1 tablet (5 mg total) by mouth every evening. 30 tablet 11  . Multiple  Vitamin (MULTIVITAMIN) capsule Take 1 capsule by mouth daily.     No current facility-administered medications on file prior to visit.   She has No Known Allergies..  Review of Systems Review of Systems  Constitutional: Negative for activity change, appetite change and fatigue.  HENT: Negative for hearing loss, congestion, tinnitus and ear discharge.  dentist q23m Eyes: Negative for visual disturbance (see optho q1y -- vision corrected to 20/20 with glasses).  Respiratory: Negative for cough, chest tightness and shortness of breath.   Cardiovascular: Negative for chest pain, palpitations and leg swelling.  Gastrointestinal: Negative for abdominal pain, diarrhea, constipation and abdominal distention.  Genitourinary: Negative for urgency, frequency, decreased urine volume and difficulty urinating.  Musculoskeletal: Negative for back pain, arthralgias and gait problem.  Skin: Negative for color change, pallor and rash.  Neurological: Negative for dizziness, light-headedness, numbness and headaches.  Hematological: Negative for adenopathy. Does not bruise/bleed easily.  Psychiatric/Behavioral: Negative for suicidal ideas, confusion, sleep disturbance, self-injury, dysphoric mood, decreased concentration and agitation.       Objective:    BP 120/84 mmHg  Pulse 80  Temp(Src) 98.4 F (36.9 C) (Oral)  Ht 5\' 6"  (1.676 m)  Wt 289 lb 3.2 oz (131.18 kg)  BMI 46.70 kg/m2  SpO2 98% General appearance: alert, cooperative, appears stated age and no distress Head: Normocephalic, without obvious abnormality, atraumatic Eyes: conjunctivae/corneas clear. PERRL, EOM's intact. Fundi benign. Ears: normal  TM's and external ear canals both ears Nose: Nares normal. Septum midline. Mucosa normal. No drainage or sinus tenderness. Throat: lips, mucosa, and tongue normal; teeth and gums normal Neck: no adenopathy, no carotid bruit, no JVD, supple, symmetrical, trachea midline and thyroid not enlarged,  symmetric, no tenderness/mass/nodules Back: symmetric, no curvature. ROM normal. No CVA tenderness. Lungs: clear to auscultation bilaterally Breasts: gyn Heart: regular rate and rhythm, S1, S2 normal, no murmur, click, rub or gallop Abdomen: soft, non-tender; bowel sounds normal; no masses,  no organomegaly Pelvic: deferred--gyn Extremities: extremities normal, atraumatic, no cyanosis or edema Pulses: 2+ and symmetric Skin: Skin color, texture, turgor normal. No rashes or lesions Lymph nodes: Cervical, supraclavicular, and axillary nodes normal. Neurologic: Alert and oriented X 3, normal strength and tone. Normal symmetric reflexes. Normal coordination and gait Psych- no depression, no anxiety      Assessment:    Healthy female exam.       Plan:    ghm utd Check labs See After Visit Summary for Counseling Recommendations

## 2015-05-25 NOTE — Patient Instructions (Addendum)
Preventive Care for Adults A healthy lifestyle and preventive care can promote health and wellness. Preventive health guidelines for women include the following key practices.  A routine yearly physical is a good way to check with your health care provider about your health and preventive screening. It is a chance to share any concerns and updates on your health and to receive a thorough exam.  Visit your dentist for a routine exam and preventive care every 6 months. Brush your teeth twice a day and floss once a day. Good oral hygiene prevents tooth decay and gum disease.  The frequency of eye exams is based on your age, health, family medical history, use of contact lenses, and other factors. Follow your health care provider's recommendations for frequency of eye exams.  Eat a healthy diet. Foods like vegetables, fruits, whole grains, low-fat dairy products, and lean protein foods contain the nutrients you need without too many calories. Decrease your intake of foods high in solid fats, added sugars, and salt. Eat the right amount of calories for you.Get information about a proper diet from your health care provider, if necessary.  Regular physical exercise is one of the most important things you can do for your health. Most adults should get at least 150 minutes of moderate-intensity exercise (any activity that increases your heart rate and causes you to sweat) each week. In addition, most adults need muscle-strengthening exercises on 2 or more days a week.  Maintain a healthy weight. The body mass index (BMI) is a screening tool to identify possible weight problems. It provides an estimate of body fat based on height and weight. Your health care provider can find your BMI and can help you achieve or maintain a healthy weight.For adults 20 years and older:  A BMI below 18.5 is considered underweight.  A BMI of 18.5 to 24.9 is normal.  A BMI of 25 to 29.9 is considered overweight.  A BMI of  30 and above is considered obese.  Maintain normal blood lipids and cholesterol levels by exercising and minimizing your intake of saturated fat. Eat a balanced diet with plenty of fruit and vegetables. Blood tests for lipids and cholesterol should begin at age 20 and be repeated every 5 years. If your lipid or cholesterol levels are high, you are over 50, or you are at high risk for heart disease, you may need your cholesterol levels checked more frequently.Ongoing high lipid and cholesterol levels should be treated with medicines if diet and exercise are not working.  If you smoke, find out from your health care provider how to quit. If you do not use tobacco, do not start.  Lung cancer screening is recommended for adults aged 55-80 years who are at high risk for developing lung cancer because of a history of smoking. A yearly low-dose CT scan of the lungs is recommended for people who have at least a 30-pack-year history of smoking and are a current smoker or have quit within the past 15 years. A pack year of smoking is smoking an average of 1 pack of cigarettes a day for 1 year (for example: 1 pack a day for 30 years or 2 packs a day for 15 years). Yearly screening should continue until the smoker has stopped smoking for at least 15 years. Yearly screening should be stopped for people who develop a health problem that would prevent them from having lung cancer treatment.  If you are pregnant, do not drink alcohol. If you are breastfeeding,   breastfeeding, be very cautious about drinking alcohol. If you are not pregnant and choose to drink alcohol, do not have more than 1 drink per day. One drink is considered to be 12 ounces (355 mL) of beer, 5 ounces (148 mL) of wine, or 1.5 ounces (44 mL) of liquor.  Avoid use of street drugs. Do not share needles with anyone. Ask for help if you need support or instructions about stopping the use of drugs.  High blood pressure causes heart disease and increases the risk of  stroke. Your blood pressure should be checked at least every 1 to 2 years. Ongoing high blood pressure should be treated with medicines if weight loss and exercise do not work.  If you are 3-31 years old, ask your health care provider if you should take aspirin to prevent strokes.  Diabetes screening involves taking a blood sample to check your fasting blood sugar level. This should be done once every 3 years, after age 31, if you are within normal weight and without risk factors for diabetes. Testing should be considered at a younger age or be carried out more frequently if you are overweight and have at least 1 risk factor for diabetes.  Breast cancer screening is essential preventive care for women. You should practice "breast self-awareness." This means understanding the normal appearance and feel of your breasts and may include breast self-examination. Any changes detected, no matter how small, should be reported to a health care provider. Women in their 76s and 30s should have a clinical breast exam (CBE) by a health care provider as part of a regular health exam every 1 to 3 years. After age 65, women should have a CBE every year. Starting at age 67, women should consider having a mammogram (breast X-ray test) every year. Women who have a family history of breast cancer should talk to their health care provider about genetic screening. Women at a high risk of breast cancer should talk to their health care providers about having an MRI and a mammogram every year.  Breast cancer gene (BRCA)-related cancer risk assessment is recommended for women who have family members with BRCA-related cancers. BRCA-related cancers include breast, ovarian, tubal, and peritoneal cancers. Having family members with these cancers may be associated with an increased risk for harmful changes (mutations) in the breast cancer genes BRCA1 and BRCA2. Results of the assessment will determine the need for genetic counseling and  BRCA1 and BRCA2 testing.  Routine pelvic exams to screen for cancer are no longer recommended for nonpregnant women who are considered low risk for cancer of the pelvic organs (ovaries, uterus, and vagina) and who do not have symptoms. Ask your health care provider if a screening pelvic exam is right for you.  If you have had past treatment for cervical cancer or a condition that could lead to cancer, you need Pap tests and screening for cancer for at least 20 years after your treatment. If Pap tests have been discontinued, your risk factors (such as having a new sexual partner) need to be reassessed to determine if screening should be resumed. Some women have medical problems that increase the chance of getting cervical cancer. In these cases, your health care provider may recommend more frequent screening and Pap tests.  The HPV test is an additional test that may be used for cervical cancer screening. The HPV test looks for the virus that can cause the cell changes on the cervix. The cells collected during the Pap test can  be tested for HPV. The HPV test could be used to screen women aged 98 years and older, and should be used in women of any age who have unclear Pap test results. After the age of 59, women should have HPV testing at the same frequency as a Pap test.  Colorectal cancer can be detected and often prevented. Most routine colorectal cancer screening begins at the age of 35 years and continues through age 61 years. However, your health care provider may recommend screening at an earlier age if you have risk factors for colon cancer. On a yearly basis, your health care provider may provide home test kits to check for hidden blood in the stool. Use of a small camera at the end of a tube, to directly examine the colon (sigmoidoscopy or colonoscopy), can detect the earliest forms of colorectal cancer. Talk to your health care provider about this at age 18, when routine screening begins. Direct  exam of the colon should be repeated every 5-10 years through age 67 years, unless early forms of pre-cancerous polyps or small growths are found.  People who are at an increased risk for hepatitis B should be screened for this virus. You are considered at high risk for hepatitis B if:  You were born in a country where hepatitis B occurs often. Talk with your health care provider about which countries are considered high risk.  Your parents were born in a high-risk country and you have not received a shot to protect against hepatitis B (hepatitis B vaccine).  You have HIV or AIDS.  You use needles to inject street drugs.  You live with, or have sex with, someone who has hepatitis B.  You get hemodialysis treatment.  You take certain medicines for conditions like cancer, organ transplantation, and autoimmune conditions.  Hepatitis C blood testing is recommended for all people born from [birth year] through 1965 and any individual with known risks for hepatitis C.  Practice safe sex. Use condoms and avoid high-risk sexual practices to reduce the spread of sexually transmitted infections (STIs). STIs include gonorrhea, chlamydia, syphilis, trichomonas, herpes, HPV, and human immunodeficiency virus (HIV). Herpes, HIV, and HPV are viral illnesses that have no cure. They can result in disability, cancer, and death.  You should be screened for sexually transmitted illnesses (STIs) including gonorrhea and chlamydia if:  You are sexually active and are younger than 24 years.  You are older than 24 years and your health care provider tells you that you are at risk for this type of infection.  Your sexual activity has changed since you were last screened and you are at an increased risk for chlamydia or gonorrhea. Ask your health care provider if you are at risk.  If you are at risk of being infected with HIV, it is recommended that you take a prescription medicine daily to prevent HIV infection. This is  called preexposure prophylaxis (PrEP). You are considered at risk if:  You are a heterosexual woman, are sexually active, and are at increased risk for HIV infection.  You take drugs by injection.  You are sexually active with a partner who has HIV.  Talk with your health care provider about whether you are at high risk of being infected with HIV. If you choose to begin PrEP, you should first be tested for HIV. You should then be tested every 3 months for as long as you are taking PrEP.  Osteoporosis is a disease in which the bones lose minerals and  strength with aging. This can result in serious bone fractures or breaks. The risk of osteoporosis can be identified using a bone density scan. Women ages 40 years and over and women at risk for fractures or osteoporosis should discuss screening with their health care providers. Ask your health care provider whether you should take a calcium supplement or vitamin D to reduce the rate of osteoporosis.  Menopause can be associated with physical symptoms and risks. Hormone replacement therapy is available to decrease symptoms and risks. You should talk to your health care provider about whether hormone replacement therapy is right for you.  Use sunscreen. Apply sunscreen liberally and repeatedly throughout the day. You should seek shade when your shadow is shorter than you. Protect yourself by wearing long sleeves, pants, a wide-brimmed hat, and sunglasses year round, whenever you are outdoors.  Once a month, do a whole body skin exam, using a mirror to look at the skin on your back. Tell your health care provider of new moles, moles that have irregular borders, moles that are larger than a pencil eraser, or moles that have changed in shape or color.  Stay current with required vaccines (immunizations).  Influenza vaccine. All adults should be immunized every year.  Tetanus, diphtheria, and acellular pertussis (Td, Tdap) vaccine. Pregnant women should  receive 1 dose of Tdap vaccine during each pregnancy. The dose should be obtained regardless of the length of time since the last dose. Immunization is preferred during the 27th-36th week of gestation. An adult who has not previously received Tdap or who does not know her vaccine status should receive 1 dose of Tdap. This initial dose should be followed by tetanus and diphtheria toxoids (Td) booster doses every 10 years. Adults with an unknown or incomplete history of completing a 3-dose immunization series with Td-containing vaccines should begin or complete a primary immunization series including a Tdap dose. Adults should receive a Td booster every 10 years.  Varicella vaccine. An adult without evidence of immunity to varicella should receive 2 doses or a second dose if she has previously received 1 dose. Pregnant females who do not have evidence of immunity should receive the first dose after pregnancy. This first dose should be obtained before leaving the health care facility. The second dose should be obtained 4-8 weeks after the first dose.  Human papillomavirus (HPV) vaccine. Females aged 13-26 years who have not received the vaccine previously should obtain the 3-dose series. The vaccine is not recommended for use in pregnant females. However, pregnancy testing is not needed before receiving a dose. If a female is found to be pregnant after receiving a dose, no treatment is needed. In that case, the remaining doses should be delayed until after the pregnancy. Immunization is recommended for any person with an immunocompromised condition through the age of 32 years if she did not get any or all doses earlier. During the 3-dose series, the second dose should be obtained 4-8 weeks after the first dose. The third dose should be obtained 24 weeks after the first dose and 16 weeks after the second dose.  Zoster vaccine. One dose is recommended for adults aged 59 years or older unless certain conditions are  present.  Measles, mumps, and rubella (MMR) vaccine. Adults born before [birth year] generally are considered immune to measles and mumps. Adults born in [birth year] or later should have 1 or more doses of MMR vaccine unless there is a contraindication to the vaccine or there is laboratory evidence of immunity  to each of the three diseases. A routine second dose of MMR vaccine should be obtained at least 28 days after the first dose for students attending postsecondary schools, health care workers, or international travelers. People who received inactivated measles vaccine or an unknown type of measles vaccine during 1963-1967 should receive 2 doses of MMR vaccine. People who received inactivated mumps vaccine or an unknown type of mumps vaccine before 1979 and are at high risk for mumps infection should consider immunization with 2 doses of MMR vaccine. For females of childbearing age, rubella immunity should be determined. If there is no evidence of immunity, females who are not pregnant should be vaccinated. If there is no evidence of immunity, females who are pregnant should delay immunization until after pregnancy. Unvaccinated health care workers born before [birth year] who lack laboratory evidence of measles, mumps, or rubella immunity or laboratory confirmation of disease should consider measles and mumps immunization with 2 doses of MMR vaccine or rubella immunization with 1 dose of MMR vaccine.  Pneumococcal 13-valent conjugate (PCV13) vaccine. When indicated, a person who is uncertain of her immunization history and has no record of immunization should receive the PCV13 vaccine. An adult aged 58 years or older who has certain medical conditions and has not been previously immunized should receive 1 dose of PCV13 vaccine. This PCV13 should be followed with a dose of pneumococcal polysaccharide (PPSV23) vaccine. The PPSV23 vaccine dose should be obtained at least 8 weeks after the dose of PCV13 vaccine. An adult aged 65  years or older who has certain medical conditions and previously received 1 or more doses of PPSV23 vaccine should receive 1 dose of PCV13. The PCV13 vaccine dose should be obtained 1 or more years after the last PPSV23 vaccine dose.  Pneumococcal polysaccharide (PPSV23) vaccine. When PCV13 is also indicated, PCV13 should be obtained first. All adults aged 18 years and older should be immunized. An adult younger than age 74 years who has certain medical conditions should be immunized. Any person who resides in a nursing home or long-term care facility should be immunized. An adult smoker should be immunized. People with an immunocompromised condition and certain other conditions should receive both PCV13 and PPSV23 vaccines. People with human immunodeficiency virus (HIV) infection should be immunized as soon as possible after diagnosis. Immunization during chemotherapy or radiation therapy should be avoided. Routine use of PPSV23 vaccine is not recommended for American Indians, [place] Natives, or people younger than 65 years unless there are medical conditions that require PPSV23 vaccine. When indicated, people who have unknown immunization and have no record of immunization should receive PPSV23 vaccine. One-time revaccination 5 years after the first dose of PPSV23 is recommended for people aged 19-64 years who have chronic kidney failure, nephrotic syndrome, asplenia, or immunocompromised conditions. People who received 1-2 doses of PPSV23 before age 21 years should receive another dose of PPSV23 vaccine at age 42 years or later if at least 5 years have passed since the previous dose. Doses of PPSV23 are not needed for people immunized with PPSV23 at or after age 61 years.  Meningococcal vaccine. Adults with asplenia or persistent complement component deficiencies should receive 2 doses of quadrivalent meningococcal conjugate (MenACWY-D) vaccine. The doses should be obtained at least 2 months apart.  Microbiologists working with certain meningococcal bacteria, [employer] recruits, people at risk during an outbreak, and people who travel to or live in countries with a high rate of meningitis should be immunized. A first-year college student up through  age 20 years who is living in a residence hall should receive a dose if she did not receive a dose on or after her 16th birthday. Adults who have certain high-risk conditions should receive one or more doses of vaccine.  Hepatitis A vaccine. Adults who wish to be protected from this disease, have certain high-risk conditions, work with hepatitis A-infected animals, work in hepatitis A research labs, or travel to or work in countries with a high rate of hepatitis A should be immunized. Adults who were previously unvaccinated and who anticipate close contact with an international adoptee during the first 60 days after arrival in the [place] from a country with a high rate of hepatitis A should be immunized.  Hepatitis B vaccine. Adults who wish to be protected from this disease, have certain high-risk conditions, may be exposed to blood or other infectious body fluids, are household contacts or sex partners of hepatitis B positive people, are clients or workers in certain care facilities, or travel to or work in countries with a high rate of hepatitis B should be immunized.  Haemophilus influenzae type b (Hib) vaccine. A previously unvaccinated person with asplenia or sickle cell disease or having a scheduled splenectomy should receive 1 dose of Hib vaccine. Regardless of previous immunization, a recipient of a hematopoietic stem cell transplant should receive a 3-dose series 6-12 months after her successful transplant. Hib vaccine is not recommended for adults with HIV infection. Preventive Services / Frequency Ages 67 to 37 years  Blood pressure check.** / Every 1 to 2 years.  Lipid and cholesterol check.** / Every 5 years beginning at age  82.  Clinical breast exam.** / Every 3 years for women in their 52s and 68s.  BRCA-related cancer risk assessment.** / For women who have family members with a BRCA-related cancer (breast, ovarian, tubal, or peritoneal cancers).  Pap test.** / Every 2 years from ages 34 through 67. Every 3 years starting at age 17 through age 60 or 36 with a history of 3 consecutive normal Pap tests.  HPV screening.** / Every 3 years from ages 28 through ages 54 to 54 with a history of 3 consecutive normal Pap tests.  Hepatitis C blood test.** / For any individual with known risks for hepatitis C.  Skin self-exam. / Monthly.  Influenza vaccine. / Every year.  Tetanus, diphtheria, and acellular pertussis (Tdap, Td) vaccine.** / Consult your health care provider. Pregnant women should receive 1 dose of Tdap vaccine during each pregnancy. 1 dose of Td every 10 years.  Varicella vaccine.** / Consult your health care provider. Pregnant females who do not have evidence of immunity should receive the first dose after pregnancy.  HPV vaccine. / 3 doses over 6 months, if 62 and younger. The vaccine is not recommended for use in pregnant females. However, pregnancy testing is not needed before receiving a dose.  Measles, mumps, rubella (MMR) vaccine.** / You need at least 1 dose of MMR if you were born in 1957 or later. You may also need a 2nd dose. For females of childbearing age, rubella immunity should be determined. If there is no evidence of immunity, females who are not pregnant should be vaccinated. If there is no evidence of immunity, females who are pregnant should delay immunization until after pregnancy.  Pneumococcal 13-valent conjugate (PCV13) vaccine.** / Consult your health care provider.  Pneumococcal polysaccharide (PPSV23) vaccine.** / 1 to 2 doses if you smoke cigarettes or if you have certain conditions.  Meningococcal  1 dose if you are age 19 to 21 years and a first-year college  student living in a residence hall, or have one of several medical conditions, you need to get vaccinated against meningococcal disease. You may also need additional booster doses.  Hepatitis A vaccine.** / Consult your health care provider.  Hepatitis B vaccine.** / Consult your health care provider.  Haemophilus influenzae type b (Hib) vaccine.** / Consult your health care provider. Ages 40 to 64 years  Blood pressure check.** / Every 1 to 2 years.  Lipid and cholesterol check.** / Every 5 years beginning at age 20 years.  Lung cancer screening. / Every year if you are aged 55-80 years and have a 30-pack-year history of smoking and currently smoke or have quit within the past 15 years. Yearly screening is stopped once you have quit smoking for at least 15 years or develop a health problem that would prevent you from having lung cancer treatment.  Clinical breast exam.** / Every year after age 40 years.  BRCA-related cancer risk assessment.** / For women who have family members with a BRCA-related cancer (breast, ovarian, tubal, or peritoneal cancers).  Mammogram.** / Every year beginning at age 40 years and continuing for as long as you are in good health. Consult with your health care provider.  Pap test.** / Every 3 years starting at age 30 years through age 65 or 70 years with a history of 3 consecutive normal Pap tests.  HPV screening.** / Every 3 years from ages 30 years through ages 65 to 70 years with a history of 3 consecutive normal Pap tests.  Fecal occult blood test (FOBT) of stool. / Every year beginning at age 50 years and continuing until age 75 years. You may not need to do this test if you get a colonoscopy every 10 years.  Flexible sigmoidoscopy or colonoscopy.** / Every 5 years for a flexible sigmoidoscopy or every 10 years for a colonoscopy beginning at age 50 years and continuing until age 75 years.  Hepatitis C blood test.** / For all people born from 1944 through  1965 and any individual with known risks for hepatitis C.  Skin self-exam. / Monthly.  Influenza vaccine. / Every year.  Tetanus, diphtheria, and acellular pertussis (Tdap/Td) vaccine.** / Consult your health care provider. Pregnant women should receive 1 dose of Tdap vaccine during each pregnancy. 1 dose of Td every 10 years.  Varicella vaccine.** / Consult your health care provider. Pregnant females who do not have evidence of immunity should receive the first dose after pregnancy.  Zoster vaccine.** / 1 dose for adults aged 60 years or older.  Measles, mumps, rubella (MMR) vaccine.** / You need at least 1 dose of MMR if you were born in 1957 or later. You may also need a 2nd dose. For females of childbearing age, rubella immunity should be determined. If there is no evidence of immunity, females who are not pregnant should be vaccinated. If there is no evidence of immunity, females who are pregnant should delay immunization until after pregnancy.  Pneumococcal 13-valent conjugate (PCV13) vaccine.** / Consult your health care provider.  Pneumococcal polysaccharide (PPSV23) vaccine.** / 1 to 2 doses if you smoke cigarettes or if you have certain conditions.  Meningococcal vaccine.** / Consult your health care provider.  Hepatitis A vaccine.** / Consult your health care provider.  Hepatitis B vaccine.** / Consult your health care provider.  Haemophilus influenzae type b (Hib) vaccine.** / Consult your health care provider. Ages 65   years and over  Blood pressure check.** / Every 1 to 2 years.  Lipid and cholesterol check.** / Every 5 years beginning at age 20 years.  Lung cancer screening. / Every year if you are aged 55-80 years and have a 30-pack-year history of smoking and currently smoke or have quit within the past 15 years. Yearly screening is stopped once you have quit smoking for at least 15 years or develop a health problem that would prevent you from having lung cancer  treatment.  Clinical breast exam.** / Every year after age 40 years.  BRCA-related cancer risk assessment.** / For women who have family members with a BRCA-related cancer (breast, ovarian, tubal, or peritoneal cancers).  Mammogram.** / Every year beginning at age 40 years and continuing for as long as you are in good health. Consult with your health care provider.  Pap test.** / Every 3 years starting at age 30 years through age 65 or 70 years with 3 consecutive normal Pap tests. Testing can be stopped between 65 and 70 years with 3 consecutive normal Pap tests and no abnormal Pap or HPV tests in the past 10 years.  HPV screening.** / Every 3 years from ages 30 years through ages 65 or 70 years with a history of 3 consecutive normal Pap tests. Testing can be stopped between 65 and 70 years with 3 consecutive normal Pap tests and no abnormal Pap or HPV tests in the past 10 years.  Fecal occult blood test (FOBT) of stool. / Every year beginning at age 50 years and continuing until age 75 years. You may not need to do this test if you get a colonoscopy every 10 years.  Flexible sigmoidoscopy or colonoscopy.** / Every 5 years for a flexible sigmoidoscopy or every 10 years for a colonoscopy beginning at age 50 years and continuing until age 75 years.  Hepatitis C blood test.** / For all people born from 1945 through 1965 and any individual with known risks for hepatitis C.  Osteoporosis screening.** / A one-time screening for women ages 65 years and over and women at risk for fractures or osteoporosis.  Skin self-exam. / Monthly.  Influenza vaccine. / Every year.  Tetanus, diphtheria, and acellular pertussis (Tdap/Td) vaccine.** / 1 dose of Td every 10 years.  Varicella vaccine.** / Consult your health care provider.  Zoster vaccine.** / 1 dose for adults aged 60 years or older.  Pneumococcal 13-valent conjugate (PCV13) vaccine.** / Consult your health care provider.  Pneumococcal  polysaccharide (PPSV23) vaccine.** / 1 dose for all adults aged 65 years and older.  Meningococcal vaccine.** / Consult your health care provider.  Hepatitis A vaccine.** / Consult your health care provider.  Hepatitis B vaccine.** / Consult your health care provider.  Haemophilus influenzae type b (Hib) vaccine.** / Consult your health care provider. ** Family history and personal history of risk and conditions may change your health care provider's recommendations. Document Released: 11/15/2001 Document Revised: 02/03/2014 Document Reviewed: 02/14/2011 ExitCare Patient Information 2015 [employer]. This information is not intended to replace advice given to you by your health care provider. Make sure you discuss any questions you have with your health care provider.  

## 2015-05-25 NOTE — Progress Notes (Signed)
Pre visit review using our clinic review tool, if applicable. No additional management support is needed unless otherwise documented below in the visit note. 

## 2015-06-05 ENCOUNTER — Telehealth: Payer: Self-pay | Admitting: Family Medicine

## 2015-06-05 NOTE — Telephone Encounter (Signed)
VM left advising all labs are normal and a copy has been mailed.      KP

## 2015-06-05 NOTE — Telephone Encounter (Signed)
Caller name: Taneah Masri  Relationship to patient: Self  Can be reached: [phone] Pharmacy:  Reason for call: pt is returning your call.

## 2015-06-09 ENCOUNTER — Ambulatory Visit (INDEPENDENT_AMBULATORY_CARE_PROVIDER_SITE_OTHER): Payer: 59 | Admitting: Family Medicine

## 2015-06-09 ENCOUNTER — Encounter: Payer: Self-pay | Admitting: Family Medicine

## 2015-06-09 VITALS — BP 118/78 | HR 86 | Temp 98.6°F | Wt 288.2 lb

## 2015-06-09 DIAGNOSIS — J0110 Acute frontal sinusitis, unspecified: Secondary | ICD-10-CM | POA: Diagnosis not present

## 2015-06-09 MED ORDER — AMOXICILLIN-POT CLAVULANATE 875-125 MG PO TABS
1.0000 | ORAL_TABLET | Freq: Two times a day (BID) | ORAL | Status: DC
Start: 2015-06-09 — End: 2015-08-08

## 2015-06-09 NOTE — Progress Notes (Signed)
Subjective:    Patient ID: Stephanie Mcconnell, female    DOB: [birth year], 44 y.o.   MRN: [id number]  Chief Complaint  Patient presents with  . URI    x's 3 days with Bilateral ear pain, sore throat, nasal congestion, cough and runny nose    HPI Patient is in today for 3 days of sore throat, cough and congestion.  She is taking her xyzal and flonase with no relief.     Past Medical History  Diagnosis Date  . Tuberculosis     Exposed to TB 2003 and recived tx but never dx'd  . Bulging disc     lower back.     Past Surgical History  Procedure Laterality Date  . Ventral hernia repair  03/12/2012    Procedure: LAPAROSCOPIC VENTRAL HERNIA;  Surgeon: Madilyn Hook, DO;  Location: [place];  Service: General;  Laterality: N/A;  . Mass excision  03/12/2012    Procedure: EXCISION MASS;  Surgeon: Madilyn Hook, DO;  Location: [place];  Service: General;  Laterality: Right;  . Hernia repair    . Endoscopic vein laser treatment      [facility]    History reviewed. No pertinent family history.  Social History   Social History  . Marital Status: Married    Spouse Name: N/A  . Number of Children: N/A  . Years of Education: N/A   Occupational History  . Not on file.   Social History Main Topics  . Smoking status: Never Smoker   . Smokeless tobacco: Not on file  . Alcohol Use: 0.6 oz/week    1 Glasses of wine per week     Comment: rarely  . Drug Use: No  . Sexual Activity:    Partners: Male   Other Topics Concern  . Not on file   Social History Narrative   Exercise-- no-- but will start    Outpatient Prescriptions Prior to Visit  Medication Sig Dispense Refill  . fluticasone (FLONASE) 50 MCG/ACT nasal spray Place 2 sprays into both nostrils daily. 16 g 6  . levocetirizine (XYZAL) 5 MG tablet Take 1 tablet (5 mg total) by mouth every evening. 30 tablet 11  . Multiple Vitamin (MULTIVITAMIN) capsule Take 1 capsule by mouth daily.    . NONFORMULARY OR COMPOUNDED ITEM Compression  stockings 20-30 mm for varicose veins 2 each 0   No facility-administered medications prior to visit.    No Known Allergies  Review of Systems  Constitutional: Positive for chills and malaise/fatigue. Negative for fever.  HENT: Positive for congestion, ear pain and sore throat. Negative for hearing loss.   Eyes: Negative for discharge.  Respiratory: Positive for cough. Negative for sputum production and shortness of breath.   Cardiovascular: Negative for chest pain, palpitations and leg swelling.  Gastrointestinal: Negative for heartburn, nausea, vomiting, abdominal pain, diarrhea, constipation and blood in stool.  Genitourinary: Negative for dysuria, urgency, frequency and hematuria.  Musculoskeletal: Negative for myalgias, back pain and falls.  Skin: Negative for rash.  Neurological: Negative for dizziness, sensory change, loss of consciousness, weakness and headaches.  Endo/Heme/Allergies: Negative for environmental allergies. Does not bruise/bleed easily.  Psychiatric/Behavioral: Negative for depression and suicidal ideas. The patient is not nervous/anxious and does not have insomnia.        Objective:    Physical Exam  Constitutional: She is oriented to person, place, and time. She appears well-developed and well-nourished.  HENT:  Right Ear: Hearing, external ear and ear canal normal. A  middle ear effusion is present.  Left Ear: Hearing, external ear and ear canal normal. A middle ear effusion is present.  Nose: Mucosal edema and sinus tenderness present. Right sinus exhibits maxillary sinus tenderness and frontal sinus tenderness. Left sinus exhibits maxillary sinus tenderness and frontal sinus tenderness.  Mouth/Throat: Posterior oropharyngeal erythema present. No posterior oropharyngeal edema.  + PND + errythema  Eyes: Conjunctivae are normal. Right eye exhibits no discharge. Left eye exhibits no discharge.  Neck: Normal range of motion. Neck supple.  Cardiovascular:  Normal rate, regular rhythm and normal heart sounds.   No murmur heard. Pulmonary/Chest: Effort normal and breath sounds normal. No respiratory distress. She has no wheezes. She has no rales. She exhibits no tenderness.  Musculoskeletal: She exhibits no edema.  Lymphadenopathy:    She has cervical adenopathy.  Neurological: She is alert and oriented to person, place, and time.    BP 118/78 mmHg  Pulse 86  Temp(Src) 98.6 F (37 C) (Oral)  Wt 288 lb 3.2 oz (130.727 kg)  SpO2 98% Wt Readings from Last 3 Encounters:  06/09/15 288 lb 3.2 oz (130.727 kg)  05/25/15 289 lb 3.2 oz (131.18 kg)  12/05/14 281 lb 6.4 oz (127.642 kg)     Lab Results  Component Value Date   WBC 5.5 05/25/2015   HGB 13.7 05/25/2015   HCT 40.7 05/25/2015   PLT 219.0 05/25/2015   GLUCOSE 93 05/25/2015   CHOL 175 05/25/2015   TRIG 72.0 05/25/2015   HDL 55.60 05/25/2015   LDLCALC 105* 05/25/2015   ALT 17 05/25/2015   AST 20 05/25/2015   NA 138 05/25/2015   K 3.8 05/25/2015   CL 103 05/25/2015   CREATININE 0.67 05/25/2015   BUN 13 05/25/2015   CO2 27 05/25/2015   TSH 1.02 05/25/2015    Lab Results  Component Value Date   TSH 1.02 05/25/2015   Lab Results  Component Value Date   WBC 5.5 05/25/2015   HGB 13.7 05/25/2015   HCT 40.7 05/25/2015   MCV 83.1 05/25/2015   PLT 219.0 05/25/2015   Lab Results  Component Value Date   NA 138 05/25/2015   K 3.8 05/25/2015   CO2 27 05/25/2015   GLUCOSE 93 05/25/2015   BUN 13 05/25/2015   CREATININE 0.67 05/25/2015   BILITOT 0.5 05/25/2015   ALKPHOS 48 05/25/2015   AST 20 05/25/2015   ALT 17 05/25/2015   PROT 7.3 05/25/2015   ALBUMIN 4.2 05/25/2015   CALCIUM 9.4 05/25/2015   GFR 123.27 05/25/2015   Lab Results  Component Value Date   CHOL 175 05/25/2015   Lab Results  Component Value Date   HDL 55.60 05/25/2015   Lab Results  Component Value Date   LDLCALC 105* 05/25/2015   Lab Results  Component Value Date   TRIG 72.0 05/25/2015    Lab Results  Component Value Date   CHOLHDL 3 05/25/2015   No results found for: HGBA1C     Assessment & Plan:   Problem List Items Addressed This Visit    None    Visit Diagnoses    Acute frontal sinusitis, recurrence not specified    -  Primary    Relevant Medications    amoxicillin-clavulanate (AUGMENTIN) 875-125 MG per tablet                con't flonase and xyzal daily             rto prn  I am having Stephanie Mcconnell  start on amoxicillin-clavulanate. I am also having her maintain her multivitamin, levocetirizine, fluticasone, and NONFORMULARY OR COMPOUNDED ITEM.  Meds ordered this encounter  Medications  . amoxicillin-clavulanate (AUGMENTIN) 875-125 MG per tablet    Sig: Take 1 tablet by mouth 2 (two) times daily.    Dispense:  20 tablet    Refill:  0     Garnet Koyanagi, DO

## 2015-06-09 NOTE — Patient Instructions (Signed)

## 2015-06-09 NOTE — Progress Notes (Signed)
Pre visit review using our clinic review tool, if applicable. No additional management support is needed unless otherwise documented below in the visit note. 

## 2015-08-08 ENCOUNTER — Ambulatory Visit (INDEPENDENT_AMBULATORY_CARE_PROVIDER_SITE_OTHER): Payer: 59 | Admitting: Family Medicine

## 2015-08-08 ENCOUNTER — Ambulatory Visit (INDEPENDENT_AMBULATORY_CARE_PROVIDER_SITE_OTHER): Payer: 59

## 2015-08-08 VITALS — BP 122/80 | HR 89 | Temp 97.8°F | Resp 16 | Ht 66.0 in | Wt 290.0 lb

## 2015-08-08 DIAGNOSIS — M25562 Pain in left knee: Secondary | ICD-10-CM

## 2015-08-08 MED ORDER — MELOXICAM 15 MG PO TABS: 15.0000 mg | ORAL_TABLET | Freq: Every day | ORAL | Status: DC

## 2015-08-08 NOTE — Progress Notes (Signed)
Subjective:   Patient ID: Stephanie Mcconnell, female     DOB: [birth year], 44 y.o.    MRN: [id number]  PCP: Garnet Koyanagi, DO  Chief Complaint  Patient presents with  . Leg Pain    landed wrong on her left leg while jumping on a trampoline yesterday    HPI  Presents for evaluation of LEFT knee pain. She is accompanied by her husband and daughter.  She was cleaning leaves off of her 24 yo daughter's trampoline yesterday when her daughter started jumping on it. As her daughter was jumping, she flew up in the air and landed with both knees in valgus. She was able to bear weight after the injury, however the medial aspect of her left knee was very sore and became swollen. Patient complains of pain mainly with standing and walking. She took two 500 mg Bayer back and Body (ASA) yesterday afternoon and this morning and applied ice to the area last night without relief.   She did not hear any clicking or popping at the time of injury. No issues with the right knee.  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   Prior to Admission medications   Medication Sig Start Date End Date Taking? Authorizing Provider  fluticasone (FLONASE) 50 MCG/ACT nasal spray Place 2 sprays into both nostrils daily. 12/05/14  Yes Rosalita Chessman, DO  levocetirizine (XYZAL) 5 MG tablet Take 1 tablet (5 mg total) by mouth every evening. 12/05/14  Yes Rosalita Chessman, DO  Multiple Vitamin (MULTIVITAMIN) capsule Take 1 capsule by mouth daily.   Yes Historical Provider, MD  NONFORMULARY OR COMPOUNDED ITEM Compression stockings 20-30 mm for varicose veins 05/25/15  Yes Rosalita Chessman, DO     No Known Allergies   Patient Active Problem List   Diagnosis Date Noted  . Severe obesity (BMI >= 40) ([place]) 08/08/2015     History reviewed. No pertinent family history.   Social History   Social History  . Marital Status: Married    Spouse Name: N/A  . Number of  Children: 1  . Years of Education: N/A   Occupational History  . Nurse [employer], weekends   Social History Main Topics  . Smoking status: Never Smoker   . Smokeless tobacco: Not on file  . Alcohol Use: 0.6 oz/week    1 Glasses of wine per week     Comment: rarely  . Drug Use: No  . Sexual Activity:    Partners: Male   Other Topics Concern  . Not on file   Social History Narrative   From [place], [place]   Lives with her husband and their daughter.        Review of Systems Musculoskeletal: Positive for joint swelling (left medial knee), arthralgias (left medial knee) and gait problem (limping d/t left knee pain). Negative for back pain and neck pain.  Skin: Negative for color change (no bruising of the knee).        Objective:  Physical Exam  Constitutional: She is oriented to person, place, and time. She appears well-developed and well-nourished. She is active and cooperative. No distress.  BP 122/80 mmHg  Pulse 89  Temp(Src) 97.8 F (36.6 C) (Oral)  Resp 16  Ht 5\' 6"  (1.676 m)  Wt 290 lb (131.543 kg)  BMI 46.83 kg/m2  SpO2 98%  LMP 08/02/2015   Eyes: Conjunctivae  are normal.  Pulmonary/Chest: Effort normal.  Musculoskeletal:       Left knee: She exhibits decreased range of motion, swelling and MCL laxity. She exhibits no effusion, no ecchymosis, no deformity, no laceration, no erythema, normal alignment, no LCL laxity, normal patellar mobility, no bony tenderness and normal meniscus. Tenderness found. Medial joint line and MCL tenderness noted. No lateral joint line, no LCL and no patellar tendon tenderness noted.  Difficult exam due to body habitus.  Neurological: She is alert and oriented to person, place, and time.  Psychiatric: She has a normal mood and affect. Her speech is normal and behavior is normal.     LEFT Knee: [facility] reading (PRIMARY) by  Dr. Joseph Art. Possible effusion, but no bony abnormality.          Assessment & Plan:    1. Knee pain, acute, left Suspect MCL sprain. 6" ace wrap placed. Elect against crutches training due to concern that they would increase instability and risk for additional injury. Continue with ice. Minimize weight bearing and walking. Meloxicam. Re-evaluate on 11/10 or 11/11. Consider ability to RTW, possibly with restrictions, and if symptoms persist, need for ortho and/or MRI knee. - DG Knee Complete 4 Views Left; Future - meloxicam (MOBIC) 15 MG tablet; Take 1 tablet (15 mg total) by mouth daily.  Dispense: 30 tablet; Refill: 0   Fara Chute, PA-C Physician Assistant-Certified [facility]   Hx and x-ray reviewed, agree with plan Ruthe Mannan, MD

## 2015-08-08 NOTE — Patient Instructions (Signed)

## 2015-08-08 NOTE — Progress Notes (Signed)
Subjective:    Patient ID: Stephanie Mcconnell, female    DOB: Sep 21, 1971, 44 y.o.   MRN: [id number]  Chief Complaint  Patient presents with  . Leg Pain    landed wrong on her left leg while jumping on a trampoline yesterday   HPI Patient presents today for evaluation of left knee pain after falling on a trampoline yesterday.   She was cleaning leaves off of her 81 yo daughter's trampoline when her daughter started jumping on it. As her daughter was jumping, she flew up in the air and landed with both knees in valgus. She was able to bear weight after the injury, however the medial aspect of her left knee was very sore and became swollen. Patient complains of pain mainly with standing and walking. She took two 500 mg Bayer yesterday afternoon and this morning and applied ice to the area last night without relief.   She did not hear any clicking or popping at the time of injury. No issues with the right knee.   Patient is accompanied by her husband and daughter today. 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  No other concerns on today's visit.   Review of Systems  Musculoskeletal: Positive for joint swelling (left medial knee), arthralgias (left medial knee) and gait problem (limping d/t left knee pain). Negative for back pain and neck pain.  Skin: Negative for color change (no bruising of the knee).   Patient Active Problem List   Diagnosis Date Noted  . Severe obesity (BMI >= 40) ([place]) 08/08/2015   History reviewed. No pertinent family history.   Social History   Social History  . Marital Status: Married    Spouse Name: N/A  . Number of Children: 1  . Years of Education: N/A   Occupational History  . Nurse [employer], weekends   Social History Main Topics  . Smoking status: Never Smoker   . Smokeless tobacco: Not on file  . Alcohol Use: 0.6 oz/week    1 Glasses of wine per week     Comment: rarely  .  Drug Use: No  . Sexual Activity:    Partners: Male   Other Topics Concern  . Not on file   Social History Narrative   From [place], [place]   Lives with her husband and their daughter.   Prior to Admission medications   Medication Sig Start Date End Date Taking? Authorizing Provider  fluticasone (FLONASE) 50 MCG/ACT nasal spray Place 2 sprays into both nostrils daily. 12/05/14  Yes Rosalita Chessman, DO  levocetirizine (XYZAL) 5 MG tablet Take 1 tablet (5 mg total) by mouth every evening. 12/05/14  Yes Rosalita Chessman, DO  Multiple Vitamin (MULTIVITAMIN) capsule Take 1 capsule by mouth daily.   Yes Historical Provider, MD  NONFORMULARY OR COMPOUNDED ITEM Compression stockings 20-30 mm for varicose veins 05/25/15  Yes Yvonne R Lowne, DO   No Known Allergies    Objective:   Physical Exam  Constitutional: She is oriented to person, place, and time. No distress.  BP 122/80 mmHg  Pulse 89  Temp(Src) 97.8 F (36.6 C) (Oral)  Resp 16  Ht 5\' 6"  (1.676 m)  Wt 290 lb (131.543 kg)  BMI 46.83 kg/m2  SpO2 98%  LMP 08/02/2015. Obese female resting comfortably on exam table  HENT:  Head: Normocephalic and atraumatic.  Eyes: EOM are normal. No  scleral icterus.  Musculoskeletal:  Swelling along the medial aspect of the left knee. Mildly tender to palpation. No laxity with valgus stress. Anterior drawer sign negative. Decreased flexion and extension.    Neurological: She is alert and oriented to person, place, and time.  Skin: Skin is warm and dry. No erythema.  Psychiatric: She has a normal mood and affect. Her behavior is normal. Judgment and thought content normal.   DG Knee Complete 4 View Left shows     Assessment & Plan:  1. Knee pain, acute, left - Most likely an acute sprain of the MCL. Instructed patient to rest, continue anti-inflammatories for swelling and pain, and ice at home. Important for patient to stay off her feet and remain out of work for the next several days. If pain  persists after 5-7 days, return to clinic for re-evaluation.  - DG Knee Complete 4 Views Left; Future  2. Morbid obesity, unspecified obesity type ([place])

## 2015-08-14 ENCOUNTER — Encounter: Payer: Self-pay | Admitting: Family Medicine

## 2015-08-14 ENCOUNTER — Ambulatory Visit (INDEPENDENT_AMBULATORY_CARE_PROVIDER_SITE_OTHER): Payer: 59 | Admitting: Family Medicine

## 2015-08-14 VITALS — BP 114/81 | HR 71 | Temp 98.2°F | Resp 16 | Wt 293.0 lb

## 2015-08-14 DIAGNOSIS — M25562 Pain in left knee: Secondary | ICD-10-CM | POA: Diagnosis not present

## 2015-08-14 DIAGNOSIS — E669 Obesity, unspecified: Secondary | ICD-10-CM | POA: Diagnosis not present

## 2015-08-14 NOTE — Progress Notes (Signed)
   Subjective:    Patient ID: Stephanie Mcconnell, female    DOB: Feb 01, 1971, 44 y.o.   MRN: [id number]  HPI This is a pleasant 44 yo female who presents today for follow up of left knee pain. She was seen 6 days ago following an injury while jumping on a trampoline the previous day. She has had significant improvement in her pain. She returned to work this week and did ok. She has had some stiffness after sitting for long periods of time.  She is very frustrated about her inability to lose weight. She has tried multiple diet programs and can lose weight but not keep it off. She's had lab testing which has been normal. She has had treatment on her leg veins and had custom compression stockings from [place] that she wore for about year. They have worn out and she needs new compression stockings.   Past Medical History  Diagnosis Date  . Tuberculosis     Exposed to TB 2003 and recived tx but never dx'd  . Bulging disc     lower back.   . Allergy    Past Surgical History  Procedure Laterality Date  . Ventral hernia repair  03/12/2012    Procedure: LAPAROSCOPIC VENTRAL HERNIA;  Surgeon: Madilyn Hook, DO;  Location: [place];  Service: General;  Laterality: N/A;  . Mass excision  03/12/2012    Procedure: EXCISION MASS;  Surgeon: Madilyn Hook, DO;  Location: [place];  Service: General;  Laterality: Right;  . Hernia repair    . Endoscopic vein laser treatment      [facility]   No family history on file. Social History  Substance Use Topics  . Smoking status: Never Smoker   . Smokeless tobacco: None  . Alcohol Use: 0.6 oz/week    1 Glasses of wine per week     Comment: rarely    Review of Systems Per HPI    Objective:   Physical Exam  Constitutional: She is oriented to person, place, and time. She appears well-developed and well-nourished.  Morbidly obese   HENT:  Head: Normocephalic and atraumatic.  Musculoskeletal:       Left knee: Normal.  Bilateral lower extremities with multiple  varicosities.   Neurological: She is alert and oriented to person, place, and time.  Skin: Skin is warm and dry.  Psychiatric: She has a normal mood and affect. Her behavior is normal. Judgment and thought content normal.  Vitals reviewed.  BP 114/81 mmHg  Pulse 71  Temp(Src) 98.2 F (36.8 C) (Oral)  Resp 16  Wt 293 lb (132.904 kg)  LMP 08/02/2015 Wt Readings from Last 3 Encounters:  08/14/15 293 lb (132.904 kg)  08/08/15 290 lb (131.543 kg)  06/09/15 288 lb 3.2 oz (130.727 kg)       Assessment & Plan:  1. Knee pain, left - this has improved, encouraged her to do gentle ROM exercises twice a day and gradually increase activity as tolerated  2. Obesity - Ambulatory referral to diabetic education  3. Varicose Veins  - provided her with support stocking catalog and encouraged her to consider wearing daily  Clarene Reamer, FNP-BC  [facility]  08/14/2015 4:45 PM f

## 2015-08-14 NOTE — Patient Instructions (Signed)
Do gentle range of motion twice a day with your knee Slowly increase your activity level

## 2015-10-15 ENCOUNTER — Ambulatory Visit (INDEPENDENT_AMBULATORY_CARE_PROVIDER_SITE_OTHER): Payer: 59 | Admitting: Family Medicine

## 2015-10-15 ENCOUNTER — Encounter: Payer: Self-pay | Admitting: Family Medicine

## 2015-10-15 MED ORDER — LIRAGLUTIDE -WEIGHT MANAGEMENT 18 MG/3ML ~~LOC~~ SOPN: 3.0000 mg | PEN_INJECTOR | Freq: Every day | SUBCUTANEOUS | Status: DC

## 2015-10-15 NOTE — Progress Notes (Signed)
Pre visit review using our clinic review tool, if applicable. No additional management support is needed unless otherwise documented below in the visit note. 

## 2015-10-15 NOTE — Patient Instructions (Addendum)
Start with 0.6 mg for 1 week and inc by 0.6 to a max of 3.0 mg     Liraglutide injection (Weight Management) What is this medicine? LIRAGLUTIDE (LIR a GLOO tide) is used with a reduced calorie diet and exercise to help you lose weight. This medicine may be used for other purposes; ask your health care provider or pharmacist if you have questions. What should I tell my health care provider before I take this medicine? They need to know if you have any of these conditions: -endocrine tumors (MEN 2) or if someone in your family had these tumors -gallstones -high cholesterol -history of alcohol abuse problem -history of pancreatitis -kidney disease or if you are on dialysis -liver disease -previous swelling of the tongue, face, or lips with difficulty breathing, difficulty swallowing, hoarseness, or tightening of the throat -stomach problems -suicidal thoughts, plans, or attempt; a previous suicide attempt by you or a family member -thyroid cancer or if someone in your family had thyroid cancer -an unusual or allergic reaction to liraglutide, medicines, foods, dyes, or preservatives -pregnant or trying to get pregnant -breast-feeding How should I use this medicine? This medicine is for injection under the skin of your upper leg, stomach area, or upper arm. You will be taught how to prepare and give this medicine. Use exactly as directed. Take your medicine at regular intervals. Do not take it more often than directed. It is important that you put your used needles and syringes in a special sharps container. Do not put them in a trash can. If you do not have a sharps container, call your pharmacist or healthcare provider to get one. A special MedGuide will be given to you by the pharmacist with each prescription and refill. Be sure to read this information carefully each time. Talk to your pediatrician regarding the use of this medicine in children. Special care may be needed. Overdosage: If  you think you have taken too much of this medicine contact a poison control center or emergency room at once. NOTE: This medicine is only for you. Do not share this medicine with others. What if I miss a dose? If you miss a dose, take it as soon as you can. If it is almost time for your next dose, take only that dose. Do not take double or extra doses. If you miss your dose for 3 days or more, call your doctor or health care professional to talk about how to restart this medicine. What may interact with this medicine? -acetaminophen -atorvastatin -birth control pills -digoxin -griseofulvin -lisinopril This list may not describe all possible interactions. Give your health care provider a list of all the medicines, herbs, non-prescription drugs, or dietary supplements you use. Also tell them if you smoke, drink alcohol, or use illegal drugs. Some items may interact with your medicine. What should I watch for while using this medicine? Visit your doctor or health care professional for regular checks on your progress. This medicine is intended to be used in addition to a healthy diet and appropriate exercise. The best results are achieved this way. Do not increase or in any way change your dose without consulting your doctor or health care professional. This medicine may affect blood sugar levels. If you have diabetes, check with your doctor or health care professional before you change your diet or the dose of your diabetic medicine. Patients and their families should watch out for worsening depression or thoughts of suicide. Also watch out for sudden changes  in feelings such as feeling anxious, agitated, panicky, irritable, hostile, aggressive, impulsive, severely restless, overly excited and hyperactive, or not being able to sleep. If this happens, especially at the beginning of treatment or after a change in dose, call your health care professional. What side effects may I notice from receiving this  medicine? Side effects that you should report to your doctor or health care professional as soon as possible: -allergic reactions like skin rash, itching or hives, swelling of the face, lips, or tongue -breathing problems -fever, chills -loss of appetite -signs and symptoms of low blood sugar such as feeling anxious, confusion, dizziness, increased hunger, unusually weak or tired, sweating, shakiness, cold, irritable, headache, blurred vision, fast heartbeat, loss of consciousness -trouble passing urine or change in the amount of urine -unusual stomach pain or upset -vomiting Side effects that usually do not require medical attention (Report these to your doctor or health care professional if they continue or are bothersome.): -constipation -diarrhea -fatigue -headache -nausea This list may not describe all possible side effects. Call your doctor for medical advice about side effects. You may report side effects to FDA at 1-800-FDA-1088. Where should I keep my medicine? Keep out of the reach of children. Store unopened pen in a refrigerator between 2 and 8 degrees C (36 and 46 degrees F). Do not freeze or use if the medicine has been frozen. Protect from light and excessive heat. After you first use the pen, it can be stored at room temperature between 15 and 30 degrees C (59 and 86 degrees F) or in a refrigerator. Throw away your used pen after 30 days or after the expiration date, whichever comes first. Do not store your pen with the needle attached. If the needle is left on, medicine may leak from the pen. NOTE: This sheet is a summary. It may not cover all possible information. If you have questions about this medicine, talk to your doctor, pharmacist, or health care provider.    2016, Elsevier/Gold Standard. (2013-11-14 12:29:49)

## 2015-10-15 NOTE — Progress Notes (Signed)
Patient ID: Stephanie Mcconnell, female    DOB: 07-31-1971  Age: 45 y.o. MRN: [id number]    Subjective:  Subjective HPI Stephanie Mcconnell presents to discuss weight loss.  She is requesting saxenda.  She is trying to exercise and watch what she eats.    Review of Systems  Constitutional: Negative for fever, diaphoresis, appetite change, fatigue and unexpected weight change.  HENT: Positive for postnasal drip, rhinorrhea and sinus pressure.   Eyes: Negative for pain, redness and visual disturbance.  Respiratory: Negative for cough, chest tightness, shortness of breath and wheezing.   Cardiovascular: Negative for chest pain, palpitations and leg swelling.  Endocrine: Negative for cold intolerance, heat intolerance, polydipsia, polyphagia and polyuria.  Genitourinary: Negative for dysuria, frequency and difficulty urinating.  Allergic/Immunologic: Negative for environmental allergies.  Neurological: Negative for dizziness, light-headedness, numbness and headaches.    History Past Medical History  Diagnosis Date  . Tuberculosis     Exposed to TB 2003 and recived tx but never dx'd  . Bulging disc     lower back.   . Allergy     She has past surgical history that includes Ventral hernia repair (03/12/2012); Mass excision (03/12/2012); Hernia repair; and Endoscopic vein laser treatment.   Her family history is not on file.She reports that she has never smoked. She does not have any smokeless tobacco history on file. She reports that she drinks about 0.6 oz of alcohol per week. She reports that she does not use illicit drugs.  Current Outpatient Prescriptions on File Prior to Visit  Medication Sig Dispense Refill  . fluticasone (FLONASE) 50 MCG/ACT nasal spray Place 2 sprays into both nostrils daily. 16 g 6  . levocetirizine (XYZAL) 5 MG tablet Take 1 tablet (5 mg total) by mouth every evening. 30 tablet 11  . meloxicam (MOBIC) 15 MG tablet Take 1 tablet (15 mg total) by mouth daily. 30 tablet 0    . Multiple Vitamin (MULTIVITAMIN) capsule Take 1 capsule by mouth daily.    . NONFORMULARY OR COMPOUNDED ITEM Compression stockings 20-30 mm for varicose veins 2 each 0  . tranexamic acid (LYSTEDA) 650 MG TABS tablet Take 1,300 mg by mouth 3 (three) times daily.     No current facility-administered medications on file prior to visit.     Objective:  Objective Physical Exam  Constitutional: She is oriented to person, place, and time. She appears well-developed and well-nourished. No distress.  HENT:  Right Ear: External ear normal.  Left Ear: External ear normal.  Nose: Nose normal.  Mouth/Throat: Oropharynx is clear and moist.  Eyes: EOM are normal. Pupils are equal, round, and reactive to light.  Neck: Normal range of motion. Neck supple.  Cardiovascular: Normal rate, regular rhythm and normal heart sounds.   No murmur heard. Pulmonary/Chest: Effort normal and breath sounds normal. No respiratory distress. She has no wheezes. She has no rales. She exhibits no tenderness.  Neurological: She is alert and oriented to person, place, and time.  Psychiatric: She has a normal mood and affect. Her behavior is normal. Judgment and thought content normal.  Nursing note and vitals reviewed.  BP 120/80 mmHg  Pulse 91  Temp(Src) 98.4 F (36.9 C) (Oral)  Wt 298 lb (135.172 kg)  SpO2 98% Wt Readings from Last 3 Encounters:  10/15/15 298 lb (135.172 kg)  08/14/15 293 lb (132.904 kg)  08/08/15 290 lb (131.543 kg)     Lab Results  Component Value Date   WBC 5.5 05/25/2015  HGB 13.7 05/25/2015   HCT 40.7 05/25/2015   PLT 219.0 05/25/2015   GLUCOSE 93 05/25/2015   CHOL 175 05/25/2015   TRIG 72.0 05/25/2015   HDL 55.60 05/25/2015   LDLCALC 105* 05/25/2015   ALT 17 05/25/2015   AST 20 05/25/2015   NA 138 05/25/2015   K 3.8 05/25/2015   CL 103 05/25/2015   CREATININE 0.67 05/25/2015   BUN 13 05/25/2015   CO2 27 05/25/2015   TSH 1.02 05/25/2015    [place]  Axilla  12/31/2014  CLINICAL DATA:  Mass on a recent screening mammogram in the posterior aspect of the outer left breast. EXAM: ULTRASOUND OF THE LEFT BREAST COMPARISON:  Previous examinations, including the screening mammogram dated 12/26/2014. FINDINGS: On physical exam, in no mass is palpable in the outer left breast. Targeted ultrasound is performed, showing a 1.3 cm cluster of cysts in the 3 o'clock position of the left breast, 7 cm from the nipple, corresponding to the mammographic mass. There are multiple additional smaller cysts in that region of the breast. IMPRESSION: Multiple left breast cysts.  No evidence of malignancy. RECOMMENDATION: Bilateral screening mammogram in 1 year. I have discussed the findings and recommendations with the patient. Results were also provided in writing at the conclusion of the visit. If applicable, a reminder letter will be sent to the patient regarding the next appointment. BI-RADS CATEGORY  2: Benign. Electronically Signed   By: Claudie Revering M.D.   On: 12/31/2014 13:29     Assessment & Plan:  Plan I am having Stephanie Mcconnell on Liraglutide -Weight Management. I am also having her maintain her multivitamin, levocetirizine, fluticasone, NONFORMULARY OR COMPOUNDED ITEM, meloxicam, and tranexamic acid.  Meds ordered this encounter  Medications  . Liraglutide -Weight Management (SAXENDA) 18 MG/3ML SOPN    Sig: Inject 3 mg into the skin daily.    Dispense:  3 mL    Refill:  3    Problem List Items Addressed This Visit    None    Visit Diagnoses    Morbid obesity, unspecified obesity type ([place])    -  Primary    Relevant Medications    Liraglutide -Weight Management (SAXENDA) 18 MG/3ML SOPN     encouraged pt to diet and exercise  Follow-up: Return in about 4 weeks (around 11/12/2015), or if symptoms worsen or fail to improve.  Garnet Koyanagi, DO

## 2015-10-19 ENCOUNTER — Telehealth: Payer: Self-pay | Admitting: *Deleted

## 2015-10-19 MED FILL — BD PEN NDL SHORT 31GX5/16: 31G X 8 MM | 90 days supply | Qty: 100 | Fill #0

## 2015-10-19 NOTE — Telephone Encounter (Signed)
Received fax from pharmacy requesting PA for Saxenda injectable, Initiated via Cover My Meds; awaiting response/SLS 01/16

## 2015-10-20 MED FILL — SAXENDA 18 MG/3 ML PEN: 18 | 30 days supply | Qty: 15 | Fill #0

## 2015-10-20 NOTE — Telephone Encounter (Signed)
Received Approval for Saxenda injectible, valid 10/19/15 through 02/16/16; forward to pharmacy via fax/SLS 01/17

## 2015-11-26 MED FILL — SAXENDA 18 MG/3 ML PEN: 18 | 30 days supply | Qty: 15 | Fill #1

## 2015-12-02 LAB — HM MAMMOGRAPHY: HM MAMMO: NORMAL (ref 0–4)

## 2015-12-24 MED FILL — SAXENDA 18 MG/3 ML PEN: 18 | 30 days supply | Qty: 15 | Fill #2

## 2015-12-29 MED FILL — BD PEN NDL SHORT 31GX5/16: 31G X 8 MM | 90 days supply | Qty: 100 | Fill #1

## 2016-01-28 MED FILL — SAXENDA 18 MG/3 ML PEN: 18 | 30 days supply | Qty: 15 | Fill #3

## 2016-02-02 ENCOUNTER — Encounter: Payer: Self-pay | Admitting: Family Medicine

## 2016-02-02 ENCOUNTER — Ambulatory Visit (INDEPENDENT_AMBULATORY_CARE_PROVIDER_SITE_OTHER): Payer: 59 | Admitting: Family Medicine

## 2016-02-02 DIAGNOSIS — J069 Acute upper respiratory infection, unspecified: Secondary | ICD-10-CM

## 2016-02-02 DIAGNOSIS — J029 Acute pharyngitis, unspecified: Secondary | ICD-10-CM | POA: Diagnosis not present

## 2016-02-02 LAB — POCT RAPID STREP A (OFFICE): RAPID STREP A SCREEN: NEGATIVE

## 2016-02-02 MED ORDER — LIRAGLUTIDE -WEIGHT MANAGEMENT 18 MG/3ML ~~LOC~~ SOPN: 3.0000 mg | PEN_INJECTOR | Freq: Every day | SUBCUTANEOUS | Status: DC

## 2016-02-02 MED ORDER — AMOXICILLIN 875 MG PO TABS: 875.0000 mg | ORAL_TABLET | Freq: Two times a day (BID) | ORAL | Status: DC

## 2016-02-02 MED ORDER — FLUTICASONE PROPIONATE 50 MCG/ACT NA SUSP: 2.0000 | Freq: Every day | NASAL | Status: DC

## 2016-02-02 MED ORDER — LEVOCETIRIZINE DIHYDROCHLORIDE 5 MG PO TABS: 5.0000 mg | ORAL_TABLET | Freq: Every evening | ORAL | Status: DC

## 2016-02-02 MED FILL — AMOXICILLIN 875 MG TABLET: 875 | 10 days supply | Qty: 20 | Fill #0

## 2016-02-02 MED FILL — LEVOCETIRIZINE 5 MG TABLET: 5 | 30 days supply | Qty: 30 | Fill #0

## 2016-02-02 MED FILL — FLUTICASONE PROP 50 MCG SPR: 50 | 30 days supply | Qty: 16 | Fill #0

## 2016-02-02 NOTE — Assessment & Plan Note (Signed)
Doing well with weight loss and diet Cont saxenda rto 3 months

## 2016-02-02 NOTE — Assessment & Plan Note (Signed)
Exposed to strep-- her daugher dx last week Will tx with amoxicillin Tylenol prn pain

## 2016-02-02 NOTE — Progress Notes (Signed)
Pre visit review using our clinic review tool, if applicable. No additional management support is needed unless otherwise documented below in the visit note. 

## 2016-02-02 NOTE — Patient Instructions (Signed)

## 2016-02-02 NOTE — Progress Notes (Signed)
Patient ID: Stephanie Mcconnell, female    DOB: 11-13-1970  Age: 45 y.o. MRN: [id number]    Subjective:  Subjective HPI Stephanie Mcconnell presents for f/u weight and c/o sore throat and ear discomfort x 1 days .     Review of Systems  Constitutional: Positive for chills. Negative for fever.  HENT: Positive for congestion, ear pain, postnasal drip and sore throat. Negative for rhinorrhea and sinus pressure.   Respiratory: Negative for cough, chest tightness, shortness of breath and wheezing.   Cardiovascular: Negative for chest pain, palpitations and leg swelling.  Allergic/Immunologic: Negative for environmental allergies.    History Past Medical History  Diagnosis Date  . Tuberculosis     Exposed to TB 2003 and recived tx but never dx'd  . Bulging disc     lower back.   . Allergy     She has past surgical history that includes Ventral hernia repair (03/12/2012); Mass excision (03/12/2012); Hernia repair; and Endoscopic vein laser treatment.   Her family history is not on file.She reports that she has never smoked. She does not have any smokeless tobacco history on file. She reports that she drinks about 0.6 oz of alcohol per week. She reports that she does not use illicit drugs.  Current Outpatient Prescriptions on File Prior to Visit  Medication Sig Dispense Refill  . fluticasone (FLONASE) 50 MCG/ACT nasal spray Place 2 sprays into both nostrils daily. 16 g 6  . levocetirizine (XYZAL) 5 MG tablet Take 1 tablet (5 mg total) by mouth every evening. 30 tablet 11  . Multiple Vitamin (MULTIVITAMIN) capsule Take 1 capsule by mouth daily.    . tranexamic acid (LYSTEDA) 650 MG TABS tablet Take 1,300 mg by mouth 3 (three) times daily.     No current facility-administered medications on file prior to visit.     Objective:  Objective Physical Exam  Constitutional: She is oriented to person, place, and time. She appears well-developed and well-nourished.  HENT:  Right Ear: External ear  normal.  Left Ear: External ear normal.  Mouth/Throat: Posterior oropharyngeal edema and posterior oropharyngeal erythema present.  + PND + errythema  Eyes: Conjunctivae are normal. Right eye exhibits no discharge. Left eye exhibits no discharge.  Cardiovascular: Normal rate, regular rhythm and normal heart sounds.   No murmur heard. Pulmonary/Chest: Effort normal and breath sounds normal. No respiratory distress. She has no wheezes. She has no rales. She exhibits no tenderness.  Musculoskeletal: She exhibits no edema.  Lymphadenopathy:    She has cervical adenopathy.  Neurological: She is alert and oriented to person, place, and time.  Nursing note and vitals reviewed.  BP 125/71 mmHg  Pulse 113  Temp(Src) 98.9 F (37.2 C) (Oral)  Ht 5\' 6"  (1.676 m)  Wt 282 lb (127.914 kg)  BMI 45.54 kg/m2  SpO2 98%  LMP 01/12/2016 Wt Readings from Last 3 Encounters:  02/02/16 282 lb (127.914 kg)  10/15/15 298 lb (135.172 kg)  08/14/15 293 lb (132.904 kg)     Lab Results  Component Value Date   WBC 5.5 05/25/2015   HGB 13.7 05/25/2015   HCT 40.7 05/25/2015   PLT 219.0 05/25/2015   GLUCOSE 93 05/25/2015   CHOL 175 05/25/2015   TRIG 72.0 05/25/2015   HDL 55.60 05/25/2015   LDLCALC 105* 05/25/2015   ALT 17 05/25/2015   AST 20 05/25/2015   NA 138 05/25/2015   K 3.8 05/25/2015   CL 103 05/25/2015   CREATININE 0.67 05/25/2015  BUN 13 05/25/2015   CO2 27 05/25/2015   TSH 1.02 05/25/2015    [facility] Breast Ltd Uni Left Inc Axilla  12/31/2014  CLINICAL DATA:  Mass on a recent screening mammogram in the posterior aspect of the outer left breast. EXAM: ULTRASOUND OF THE LEFT BREAST COMPARISON:  Previous examinations, including the screening mammogram dated 12/26/2014. FINDINGS: On physical exam, in no mass is palpable in the outer left breast. Targeted ultrasound is performed, showing a 1.3 cm cluster of cysts in the 3 o'clock position of the left breast, 7 cm from the nipple, corresponding to  the mammographic mass. There are multiple additional smaller cysts in that region of the breast. IMPRESSION: Multiple left breast cysts.  No evidence of malignancy. RECOMMENDATION: Bilateral screening mammogram in 1 year. I have discussed the findings and recommendations with the patient. Results were also provided in writing at the conclusion of the visit. If applicable, a reminder letter will be sent to the patient regarding the next appointment. BI-RADS CATEGORY  2: Benign. Electronically Signed   By: Claudie Revering M.D.   On: 12/31/2014 13:29     Assessment & Plan:  Plan I have discontinued Ms. Lichtman's NONFORMULARY OR COMPOUNDED ITEM and meloxicam. I am also having her start on amoxicillin, fluticasone, and levocetirizine. Additionally, I am having her maintain her multivitamin, levocetirizine, fluticasone, tranexamic acid, and Liraglutide -Weight Management.  Meds ordered this encounter  Medications  . Liraglutide -Weight Management (SAXENDA) 18 MG/3ML SOPN    Sig: Inject 3 mg into the skin daily.    Dispense:  3 mL    Refill:  3  . amoxicillin (AMOXIL) 875 MG tablet    Sig: Take 1 tablet (875 mg total) by mouth 2 (two) times daily.    Dispense:  20 tablet    Refill:  0  . fluticasone (FLONASE) 50 MCG/ACT nasal spray    Sig: Place 2 sprays into both nostrils daily.    Dispense:  16 g    Refill:  6  . levocetirizine (XYZAL) 5 MG tablet    Sig: Take 1 tablet (5 mg total) by mouth every evening.    Dispense:  30 tablet    Refill:  5    Problem List Items Addressed This Visit      Unprioritized   Severe obesity (BMI >= 40) ([place]) - Primary    Doing well with weight loss and diet Cont saxenda rto 3 months      Relevant Medications   Liraglutide -Weight Management (SAXENDA) 18 MG/3ML SOPN   Acute pharyngitis    Exposed to strep-- her daugher dx last week Will tx with amoxicillin Tylenol prn pain       Other Visit Diagnoses    Sore throat        Relevant Medications     amoxicillin (AMOXIL) 875 MG tablet    Other Relevant Orders    POCT rapid strep A (Completed)    Acute upper respiratory infection        Relevant Medications    fluticasone (FLONASE) 50 MCG/ACT nasal spray    levocetirizine (XYZAL) 5 MG tablet       Follow-up: Return in about 3 months (around 05/04/2016), or if symptoms worsen or fail to improve, for weight check.  Ann Held, DO

## 2016-02-05 ENCOUNTER — Telehealth: Payer: Self-pay | Admitting: Family Medicine

## 2016-02-05 NOTE — Telephone Encounter (Signed)
Spoke with patient and she verbalized understanding she has agreed to take nasal spray, xyzal and follow up next week if no improvement.    KP

## 2016-02-05 NOTE — Telephone Encounter (Signed)
Patient Z-pak. Please advise    KP

## 2016-02-05 NOTE — Telephone Encounter (Signed)
Pt says that she was prescribed amoxicillian on 5/2. Pt says that medication isn't working. She would like to know if provider could call her in a C-pak instead?    Pharmacy: [facility], [place]: [phone]   Pt would like to be called when sent to pharmacy so that she will know to pick up, if possible.

## 2016-02-05 NOTE — Telephone Encounter (Signed)
z pack does not normally work on a sore throat It may be viral if amoxicillin is not working  But its only been 3 days

## 2016-02-08 ENCOUNTER — Ambulatory Visit (INDEPENDENT_AMBULATORY_CARE_PROVIDER_SITE_OTHER): Payer: 59 | Admitting: Family Medicine

## 2016-02-08 ENCOUNTER — Encounter: Payer: Self-pay | Admitting: Family Medicine

## 2016-02-08 VITALS — BP 118/70 | HR 92 | Temp 99.2°F | Wt 282.4 lb

## 2016-02-08 DIAGNOSIS — J0141 Acute recurrent pansinusitis: Secondary | ICD-10-CM

## 2016-02-08 MED ORDER — METHYLPREDNISOLONE ACETATE 80 MG/ML IJ SUSP
80.0000 mg | Freq: Once | INTRAMUSCULAR | Status: AC
  Administered 2016-02-08: 80 mg via INTRAMUSCULAR

## 2016-02-08 MED ORDER — SULFAMETHOXAZOLE-TRIMETHOPRIM 800-160 MG PO TABS: 1.0000 | ORAL_TABLET | Freq: Two times a day (BID) | ORAL | Status: DC

## 2016-02-08 MED FILL — SULFAMETHOXAZOLE-TMP DS TAB: 800-160 | 14 days supply | Qty: 28 | Fill #0

## 2016-02-08 NOTE — Progress Notes (Signed)
Pre visit review using our clinic review tool, if applicable. No additional management support is needed unless otherwise documented below in the visit note. 

## 2016-02-08 NOTE — Patient Instructions (Signed)

## 2016-02-08 NOTE — Progress Notes (Signed)
  Subjective:     GENNETTA Mcconnell is a 45 y.o. female who presents for evaluation of sinus pain. Symptoms include: congestion, cough, facial pain, headaches, itchy eyes, itchy nose, nasal congestion, post nasal drip, purulent rhinorrhea, sinus pressure, sore throat and ear pain. Onset of symptoms was 7 days ago. Symptoms have been gradually worsening since that time. Past history is significant for no history of pneumonia or bronchitis. Patient is a non-smoker.  The following portions of the patient's history were reviewed and updated as appropriate:  She  has a past medical history of Tuberculosis; Bulging disc; and Allergy. She  does not have any pertinent problems on file. She  has past surgical history that includes Ventral hernia repair (03/12/2012); Mass excision (03/12/2012); Hernia repair; and Endoscopic vein laser treatment. Her family history is not on file. She  reports that she has never smoked. She does not have any smokeless tobacco history on file. She reports that she drinks about 0.6 oz of alcohol per week. She reports that she does not use illicit drugs. She has a current medication list which includes the following prescription(s): amoxicillin, fluticasone, levocetirizine, levocetirizine, liraglutide -weight management, multivitamin, and tranexamic acid. Current Outpatient Prescriptions on File Prior to Visit  Medication Sig Dispense Refill  . amoxicillin (AMOXIL) 875 MG tablet Take 1 tablet (875 mg total) by mouth 2 (two) times daily. 20 tablet 0  . fluticasone (FLONASE) 50 MCG/ACT nasal spray Place 2 sprays into both nostrils daily. 16 g 6  . levocetirizine (XYZAL) 5 MG tablet Take 1 tablet (5 mg total) by mouth every evening. 30 tablet 11  . levocetirizine (XYZAL) 5 MG tablet Take 1 tablet (5 mg total) by mouth every evening. 30 tablet 5  . Liraglutide -Weight Management (SAXENDA) 18 MG/3ML SOPN Inject 3 mg into the skin daily. 3 mL 3  . Multiple Vitamin (MULTIVITAMIN) capsule  Take 1 capsule by mouth daily.    . tranexamic acid (LYSTEDA) 650 MG TABS tablet Take 1,300 mg by mouth 3 (three) times daily.     No current facility-administered medications on file prior to visit.   She has No Known Allergies..  Review of Systems Pertinent items are noted in HPI.   Objective:    BP 118/70 mmHg  Pulse 92  Temp(Src) 99.2 F (37.3 C) (Oral)  Wt 282 lb 6.4 oz (128.096 kg)  SpO2 100%  LMP 01/12/2016 General appearance: alert, cooperative, appears stated age and no distress Ears: normal TM's and external ear canals both ears Nose: green discharge, moderate congestion, turbinates red, swollen, sinus tenderness bilateral Throat: abnormal findings: marked oropharyngeal erythema Neck: moderate anterior cervical adenopathy, supple, symmetrical, trachea midline and thyroid not enlarged, symmetric, no tenderness/mass/nodules Lungs: clear to auscultation bilaterally Heart: S1, S2 normal    Assessment:    Acute bacterial sinusitis.    Plan:    Nasal steroids per medication orders. Antihistamines per medication orders. Biaxin per medication orders.

## 2016-03-11 ENCOUNTER — Telehealth: Payer: Self-pay | Admitting: *Deleted

## 2016-03-11 NOTE — Telephone Encounter (Signed)
PA for Saxenda initiated on [email or web address], awaiting determination. JG//CMA

## 2016-03-16 NOTE — Telephone Encounter (Signed)
PA approved for 12 months through 03/11/2017 by [employer]. Case #: [id number]. Approval letter sent for scanning. JG//CMA

## 2016-03-17 MED FILL — SAXENDA 18 MG/3 ML PEN: 18 | 30 days supply | Qty: 15 | Fill #0

## 2016-04-07 DIAGNOSIS — Z6841 Body Mass Index (BMI) 40.0 and over, adult: Secondary | ICD-10-CM | POA: Diagnosis not present

## 2016-04-07 DIAGNOSIS — Z1231 Encounter for screening mammogram for malignant neoplasm of breast: Secondary | ICD-10-CM | POA: Diagnosis not present

## 2016-04-07 DIAGNOSIS — Z01419 Encounter for gynecological examination (general) (routine) without abnormal findings: Secondary | ICD-10-CM | POA: Diagnosis not present

## 2016-04-07 MED FILL — TRANEXAMIC ACID 650 MG TAB: 650 | 3 days supply | Qty: 30 | Fill #0

## 2016-04-07 MED FILL — MEDROXYPROGESTERONE 10 MG T: 10 | 10 days supply | Qty: 10 | Fill #0

## 2016-04-20 MED FILL — BD PEN NDL SHORT 31GX5/16: 31G X 8 MM | 90 days supply | Qty: 100 | Fill #2

## 2016-04-20 MED FILL — SAXENDA 18 MG/3 ML PEN: 18 | 30 days supply | Qty: 15 | Fill #1

## 2016-05-05 ENCOUNTER — Ambulatory Visit: Payer: 59 | Admitting: Family Medicine

## 2016-05-24 ENCOUNTER — Ambulatory Visit: Payer: 59 | Admitting: Family Medicine

## 2016-06-17 MED FILL — SAXENDA 18 MG/3 ML PEN: 18 | 30 days supply | Qty: 15 | Fill #2

## 2016-06-22 MED FILL — FLUTICASONE PROP 50 MCG SPR: 50 | 30 days supply | Qty: 16 | Fill #1

## 2016-06-22 MED FILL — LEVOCETIRIZINE 5 MG TABLET: 5 | 30 days supply | Qty: 30 | Fill #1

## 2016-07-14 ENCOUNTER — Ambulatory Visit (INDEPENDENT_AMBULATORY_CARE_PROVIDER_SITE_OTHER): Payer: 59 | Admitting: Medical

## 2016-07-14 ENCOUNTER — Encounter: Payer: Self-pay | Admitting: Medical

## 2016-07-14 VITALS — BP 108/71 | HR 93 | Temp 98.5°F | Ht 66.0 in | Wt 280.6 lb

## 2016-07-14 DIAGNOSIS — L089 Local infection of the skin and subcutaneous tissue, unspecified: Secondary | ICD-10-CM

## 2016-07-14 MED ORDER — DOXYCYCLINE HYCLATE 100 MG PO TABS: 100.0000 mg | ORAL_TABLET | Freq: Two times a day (BID) | ORAL | 0 refills | Status: DC

## 2016-07-14 MED FILL — DOXYCYCLINE 100 MG TABLET: 100 | 10 days supply | Qty: 20 | Fill #0

## 2016-07-14 NOTE — Progress Notes (Signed)
Subjective:    Patient ID: Stephanie Mcconnell, female    DOB: 1971-04-24, 45 y.o.   MRN: [id number]  HPI   Pt had small area on top of her rt shoulder/arm junction. Pt states over past 2 weeks had pain and inflammation. It was red and inflammed. Pt states was painful. Then past Thursday or Friday she squeezed are and it burst. She had yellow dc and slight bloody discharge. Pt remembers small bump in area after her last vacation in august. She thought maybe something bit her on her last vacation.  LMP- June 26, 2016.  Pt has tubal ligation.    Review of Systems  Constitutional: Negative for chills, fatigue and fever.  Respiratory: Negative for cough, shortness of breath and wheezing.   Cardiovascular: Negative for chest pain and palpitations.  Musculoskeletal: Negative for back pain.  Skin: Negative for rash.       Rt shoulder- faint tender and swollen area.  Neurological: Negative for dizziness and headaches.  Hematological: Negative for adenopathy. Does not bruise/bleed easily.  Psychiatric/Behavioral: Negative for behavioral problems and confusion.    Past Medical History:  Diagnosis Date  . Allergy   . Bulging disc    lower back.   . Tuberculosis    Exposed to TB 2003 and recived tx but never dx'd     Social History   Social History  . Marital status: Married    Spouse name: N/A  . Number of children: 1  . Years of education: N/A   Occupational History  . Nurse [employer], weekends   Social History Main Topics  . Smoking status: Never Smoker  . Smokeless tobacco: Not on file  . Alcohol use 0.6 oz/week    1 Glasses of wine per week     Comment: rarely  . Drug use: No  . Sexual activity: Yes    Partners: Male   Other Topics Concern  . Not on file   Social History Narrative   From [place], [place]   Lives with her husband and their daughter.    Past Surgical History:  Procedure Laterality Date  . ENDOSCOPIC VEIN LASER TREATMENT     [facility]  . HERNIA REPAIR    . MASS EXCISION  03/12/2012   Procedure: EXCISION MASS;  Surgeon: Madilyn Hook, DO;  Location: [facility];  Service: General;  Laterality: Right;  . VENTRAL HERNIA REPAIR  03/12/2012   Procedure: LAPAROSCOPIC VENTRAL HERNIA;  Surgeon: Madilyn Hook, DO;  Location: [facility];  Service: General;  Laterality: N/A;    No family history on file.  No Known Allergies  Current Outpatient Prescriptions on File Prior to Visit  Medication Sig Dispense Refill  . fluticasone (FLONASE) 50 MCG/ACT nasal spray Place 2 sprays into both nostrils daily. 16 g 6  . levocetirizine (XYZAL) 5 MG tablet Take 1 tablet (5 mg total) by mouth every evening. 30 tablet 11  . Liraglutide -Weight Management (SAXENDA) 18 MG/3ML SOPN Inject 3 mg into the skin daily. 3 mL 3  . Multiple Vitamin (MULTIVITAMIN) capsule Take 1 capsule by mouth daily.    . tranexamic acid (LYSTEDA) 650 MG TABS tablet Take 1,300 mg by mouth 3 (three) times daily.    Marland Kitchen amoxicillin (AMOXIL) 875 MG tablet Take 1 tablet (875 mg total) by mouth 2 (two) times daily. (Patient not taking: Reported on 07/14/2016) 20 tablet 0  . levocetirizine (XYZAL) 5 MG tablet Take 1 tablet (5 mg total) by  mouth every evening. (Patient not taking: Reported on 07/14/2016) 30 tablet 5  . sulfamethoxazole-trimethoprim (BACTRIM DS,SEPTRA DS) 800-160 MG tablet Take 1 tablet by mouth 2 (two) times daily. (Patient not taking: Reported on 07/14/2016) 28 tablet 0   No current facility-administered medications on file prior to visit.     BP 108/71   Pulse 93   Temp 98.5 F (36.9 C) (Oral)   Ht 5\' 6"  (1.676 m)   Wt 280 lb 9.6 oz (127.3 kg)   LMP 06/26/2016   SpO2 100%   BMI 45.29 kg/m        Objective:   Physical Exam   General- No acute distress. Pleasant patient.  Lungs- Clear, even and unlabored. Heart- regular rate and rhythm. Neurologic- CNII- XII grossly intact. Skin- rt shoulder. At distal clavicle region. 2.5 cm indurated area  with faint tenderness. No fluctuance. No warmth.      Assessment & Plan:  You appear to have skin infection presently with history of likely small abscess that you burst.  Will rx doxycycline antibiotic.(rx advisement)  The area should clear. If abscess re-forms then would need incision and drainage.  Follow up in 10-14 days or as needed.   Nalina Yeatman, Percell Miller, PA-C

## 2016-07-14 NOTE — Patient Instructions (Addendum)
You appear to have skin infection presently with history of likely small abscess that you burst.  Will rx doxycycline antibiotic(rx advisement). The area should clear. If abscess re-forms then would need incision and drainage.  Follow up in 10-14 days or as needed.  Would recommend checking area in about 2 weeks since you described small dark area in region before area got infected. But make early morning or afternoon appointment.

## 2016-07-14 NOTE — Progress Notes (Signed)
Pre visit review using our clinic tool,if applicable. No additional management support is needed unless otherwise documented below in the visit note.  

## 2016-07-19 ENCOUNTER — Telehealth: Payer: Self-pay | Admitting: Family Medicine

## 2016-07-19 NOTE — Telephone Encounter (Signed)
Pt called in because she says that she was Rx doxycycline. She says that it hurts her stomach really bad so she didn't take it yesterday. She would like to be advised on if she should decrease her dosage or discontinue. Pt would like a call back      CB: [phone]

## 2016-07-20 NOTE — Telephone Encounter (Signed)
Patient called back states she took medication and feels "100%" better.

## 2016-07-20 NOTE — Telephone Encounter (Signed)
Called patient,left message for return call regarding stomach upset.

## 2016-08-05 MED FILL — LEVOCETIRIZINE 5 MG TABLET: 5 | 30 days supply | Qty: 30 | Fill #2

## 2016-08-05 MED FILL — SAXENDA 18 MG/3 ML PEN: 18 | 30 days supply | Qty: 15 | Fill #3

## 2016-08-05 MED FILL — FLUTICASONE PROP 50 MCG SPR: 50 | 30 days supply | Qty: 16 | Fill #2

## 2016-08-05 MED FILL — TRANEXAMIC ACID 650 MG TAB: 650 | 3 days supply | Qty: 30 | Fill #1

## 2016-09-27 MED FILL — LEVOCETIRIZINE 5 MG TABLET: 5 | 30 days supply | Qty: 30 | Fill #3

## 2016-09-27 MED FILL — FLUTICASONE PROP 50 MCG SPR: 50 | 30 days supply | Qty: 16 | Fill #3

## 2016-10-11 ENCOUNTER — Ambulatory Visit (INDEPENDENT_AMBULATORY_CARE_PROVIDER_SITE_OTHER): Payer: 59 | Admitting: Physician Assistant

## 2016-10-11 ENCOUNTER — Ambulatory Visit (HOSPITAL_BASED_OUTPATIENT_CLINIC_OR_DEPARTMENT_OTHER)
Admission: RE | Admit: 2016-10-11 | Discharge: 2016-10-11 | Disposition: A | Discharge status: Home / Self Care | Payer: 59 | Source: Ambulatory Visit | Attending: Physician Assistant | Admitting: Physician Assistant

## 2016-10-11 ENCOUNTER — Encounter: Payer: Self-pay | Admitting: Physician Assistant

## 2016-10-11 VITALS — BP 107/67 | HR 119 | Temp 100.3°F | Resp 18 | Ht 66.0 in | Wt 282.6 lb

## 2016-10-11 DIAGNOSIS — R05 Cough: Secondary | ICD-10-CM | POA: Diagnosis not present

## 2016-10-11 DIAGNOSIS — J069 Acute upper respiratory infection, unspecified: Secondary | ICD-10-CM

## 2016-10-11 DIAGNOSIS — J029 Acute pharyngitis, unspecified: Secondary | ICD-10-CM

## 2016-10-11 LAB — POCT RAPID STREP A (OFFICE): RAPID STREP A SCREEN: NEGATIVE

## 2016-10-11 MED ORDER — OSELTAMIVIR PHOSPHATE 75 MG PO CAPS: 75.0000 mg | ORAL_CAPSULE | Freq: Two times a day (BID) | ORAL | 0 refills | Status: DC

## 2016-10-11 MED FILL — OSELTAMIVIR PHOS 75 MG CAP: 75 | 5 days supply | Qty: 10 | Fill #0

## 2016-10-11 NOTE — Progress Notes (Signed)
Subjective:    Patient ID: Stephanie Mcconnell, female    DOB: 1971-05-07, 46 y.o.   MRN: [id number]  HPI  Stephanie Mcconnell is a 46 y/o female who presents with chief complaint of cough. She reports rapid worsening of cough, body aches and sore throat since yesterday. She has been sick with what she states was a "cold" on and off for about two weeks which was pretty much resolved a few days ago. Denies SOB. She also reports that she felt as though she had some wheezing last night. She does not have a history of asthma. She reports subjective fever at home. Took B[facility] Body and Pain last night. Has used some store brand cold medicine over the past few days with minimal relief. Is using her Xyzal and Flonase as prescribed. Her cough is productive at times, no blood. Her appetite was great until yesterday. She tells me that she has a history of chronic sinusitis.  She did receive a flu shot this year. 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  She is a non-smoker.  Review of Systems  See HPI  Past Medical History:  Diagnosis Date  . Allergy   . Bulging disc    lower back.   . Tuberculosis    Exposed to TB 2003 and recived tx but never dx'd     Social History   Social History  . Marital status: Married    Spouse name: N/A  . Number of children: 1  . Years of education: N/A   Occupational History  . Nurse [employer], weekends   Social History Main Topics  . Smoking status: Never Smoker  . Smokeless tobacco: Not on file  . Alcohol use 0.6 oz/week    1 Glasses of wine per week     Comment: rarely  . Drug use: No  . Sexual activity: Yes    Partners: Male   Other Topics Concern  . Not on file   Social History Narrative   From [place], [place]   Lives with her husband and their daughter.    Past Surgical History:  Procedure Laterality Date  . ENDOSCOPIC VEIN LASER TREATMENT     [facility]  . HERNIA  REPAIR    . MASS EXCISION  03/12/2012   Procedure: EXCISION MASS;  Surgeon: Madilyn Hook, DO;  Location: [facility];  Service: General;  Laterality: Right;  . VENTRAL HERNIA REPAIR  03/12/2012   Procedure: LAPAROSCOPIC VENTRAL HERNIA;  Surgeon: Madilyn Hook, DO;  Location: [facility];  Service: General;  Laterality: N/A;    No family history on file.  No Known Allergies  Current Outpatient Prescriptions on File Prior to Visit  Medication Sig Dispense Refill  . fluticasone (FLONASE) 50 MCG/ACT nasal spray Place 2 sprays into both nostrils daily. 16 g 6  . levocetirizine (XYZAL) 5 MG tablet Take 1 tablet (5 mg total) by mouth every evening. 30 tablet 5  . Liraglutide -Weight Management (SAXENDA) 18 MG/3ML SOPN Inject 3 mg into the skin daily. 3 mL 3  . Multiple Vitamin (MULTIVITAMIN) capsule Take 1 capsule by mouth daily.    . tranexamic acid (LYSTEDA) 650 MG TABS tablet Take 1,300 mg by mouth 3 (three) times daily.     No current facility-administered medications on file prior to visit.     BP 107/67 (BP Location: Right Arm, Cuff Size: Large)  Pulse (!) 119   Temp 100.3 F (37.9 C) (Oral)   Resp 18   Ht 5\' 6"  (1.676 m)   Wt 282 lb 9.6 oz (128.2 kg)   LMP 09/29/2016   SpO2 100% Comment: room air  BMI 45.61 kg/m      Objective:   Physical Exam  Constitutional: She appears well-developed and well-nourished. She is cooperative.  HENT:  Head: Normocephalic and atraumatic.  Right Ear: Tympanic membrane, external ear and ear canal normal. Tympanic membrane is not erythematous, not retracted and not bulging.  Left Ear: Tympanic membrane, external ear and ear canal normal. Tympanic membrane is not erythematous, not retracted and not bulging.  Nose: Right sinus exhibits maxillary sinus tenderness. Right sinus exhibits no frontal sinus tenderness. Left sinus exhibits maxillary sinus tenderness. Left sinus exhibits no frontal sinus tenderness.  Mouth/Throat: Uvula is midline. Posterior  oropharyngeal edema and posterior oropharyngeal erythema present. No oropharyngeal exudate.  Bilateral tonsillar swelling 2+ without exudates  Cardiovascular: Regular rhythm and normal heart sounds.  Tachycardia present.   Pulmonary/Chest: Effort normal and breath sounds normal. No accessory muscle usage. No respiratory distress.  No crackles or wheezes. Occasional bronchial breath sounds.  Lymphadenopathy:    She has no cervical adenopathy.  Neurological: She is alert.  Nursing note and vitals reviewed.   Results for orders placed or performed in visit on 02/02/16  POCT rapid strep A  Result Value Ref Range   Rapid Strep A Screen Negative Negative   Chest xray PA and lateral: no acute abnormalities     Assessment & Plan:  1. Sore throat - POCT rapid strep A --> negative  2. Upper respiratory tract infection, unspecified type - DG Chest 2 View; Future - Symptoms sound suspicious for the flu, however I have concern for PNA, and have ordered CXR to rule out. Stat CXR without acute abnormalities. Will begin Tamiflu for presumed flu, currently unable to test for flu at this facility as we are out of rapid flu tests.  Inda Coke PA-C 10/11/16

## 2016-10-11 NOTE — Progress Notes (Signed)
Pre visit review using our clinic review tool, if applicable. No additional management support is needed unless otherwise documented below in the visit note. 

## 2016-10-11 NOTE — Patient Instructions (Signed)
It was great meeting you today!  Please go to the first floor to complete a chest xray, we will call you with the results and discuss the plan after the xray has been reviewed.  Upper Respiratory Infection, Adult Most upper respiratory infections (URIs) are a viral infection of the air passages leading to the lungs. A URI affects the nose, throat, and upper air passages. The most common type of URI is nasopharyngitis and is typically referred to as "the common cold." URIs run their course and usually go away on their own. Most of the time, a URI does not require medical attention, but sometimes a bacterial infection in the upper airways can follow a viral infection. This is called a secondary infection. Sinus and middle ear infections are common types of secondary upper respiratory infections. Bacterial pneumonia can also complicate a URI. A URI can worsen asthma and chronic obstructive pulmonary disease (COPD). Sometimes, these complications can require emergency medical care and may be life threatening. What are the causes? Almost all URIs are caused by viruses. A virus is a type of germ and can spread from one person to another. What increases the risk? You may be at risk for a URI if:  You smoke.  You have chronic heart or lung disease.  You have a weakened defense (immune) system.  You are very young or very old.  You have nasal allergies or asthma.  You work in crowded or poorly ventilated areas.  You work in health care facilities or schools. What are the signs or symptoms? Symptoms typically develop 2-3 days after you come in contact with a cold virus. Most viral URIs last 7-10 days. However, viral URIs from the influenza virus (flu virus) can last 14-18 days and are typically more severe. Symptoms may include:  Runny or stuffy (congested) nose.  Sneezing.  Cough.  Sore throat.  Headache.  Fatigue.  Fever.  Loss of appetite.  Pain in your forehead, behind your  eyes, and over your cheekbones (sinus pain).  Muscle aches. How is this diagnosed? Your health care provider may diagnose a URI by:  Physical exam.  Tests to check that your symptoms are not due to another condition such as:  Strep throat.  Sinusitis.  Pneumonia.  Asthma. How is this treated? A URI goes away on its own with time. It cannot be cured with medicines, but medicines may be prescribed or recommended to relieve symptoms. Medicines may help:  Reduce your fever.  Reduce your cough.  Relieve nasal congestion. Follow these instructions at home:  Take medicines only as directed by your health care provider.  Gargle warm saltwater or take cough drops to comfort your throat as directed by your health care provider.  Use a warm mist humidifier or inhale steam from a shower to increase air moisture. This may make it easier to breathe.  Drink enough fluid to keep your urine clear or pale yellow.  Eat soups and other clear broths and maintain good nutrition.  Rest as needed.  Return to work when your temperature has returned to normal or as your health care provider advises. You may need to stay home longer to avoid infecting others. You can also use a face mask and careful hand washing to prevent spread of the virus.  Increase the usage of your inhaler if you have asthma.  Do not use any tobacco products, including cigarettes, chewing tobacco, or electronic cigarettes. If you need help quitting, ask your health care provider. How  is this prevented? The best way to protect yourself from getting a cold is to practice good hygiene.  Avoid oral or hand contact with people with cold symptoms.  Wash your hands often if contact occurs. There is no clear evidence that vitamin C, vitamin E, echinacea, or exercise reduces the chance of developing a cold. However, it is always recommended to get plenty of rest, exercise, and practice good nutrition. Contact a health care  provider if:  You are getting worse rather than better.  Your symptoms are not controlled by medicine.  You have chills.  You have worsening shortness of breath.  You have brown or red mucus.  You have yellow or brown nasal discharge.  You have pain in your face, especially when you bend forward.  You have a fever.  You have swollen neck glands.  You have pain while swallowing.  You have white areas in the back of your throat. Get help right away if:  You have severe or persistent:  Headache.  Ear pain.  Sinus pain.  Chest pain.  You have chronic lung disease and any of the following:  Wheezing.  Prolonged cough.  Coughing up blood.  A change in your usual mucus.  You have a stiff neck.  You have changes in your:  Vision.  Hearing.  Thinking.  Mood. This information is not intended to replace advice given to you by your health care provider. Make sure you discuss any questions you have with your health care provider. Document Released: 03/15/2001 Document Revised: 05/22/2016 Document Reviewed: 12/25/2013 Elsevier Interactive Patient Education  2017 [employer].

## 2016-11-03 ENCOUNTER — Telehealth: Payer: Self-pay | Admitting: Family Medicine

## 2016-11-03 NOTE — Telephone Encounter (Signed)
Send z pack #1  As directed

## 2016-11-03 NOTE — Telephone Encounter (Signed)
Patient was seen on 10/11/16 by [facility] for a possible sinus infection. She states that she is still having a cough and congestion and would like to get a zpack sent in to the pharmacy. Please advise  Pharmacy: [facility], [place]  Phone: [phone]

## 2016-11-03 NOTE — Telephone Encounter (Signed)
Can we send this in or do you want to come in for a recheck?

## 2016-11-04 MED ORDER — AZITHROMYCIN 250 MG PO TABS: ORAL_TABLET | ORAL | 0 refills | Status: DC

## 2016-11-04 MED FILL — AZITHROMYCIN 250 MG TABLET: 250 | 5 days supply | Qty: 6 | Fill #0

## 2016-11-04 NOTE — Telephone Encounter (Signed)
Patient notified and rx sent in 

## 2016-12-19 MED FILL — FLUTICASONE PROP 50 MCG SPR: 50 | 30 days supply | Qty: 16 | Fill #4

## 2017-01-09 ENCOUNTER — Encounter: Payer: Self-pay | Admitting: Family Medicine

## 2017-01-09 ENCOUNTER — Ambulatory Visit (INDEPENDENT_AMBULATORY_CARE_PROVIDER_SITE_OTHER): Payer: 59 | Admitting: Family Medicine

## 2017-01-09 VITALS — BP 112/90 | HR 77 | Temp 98.1°F | Ht 66.0 in | Wt 291.6 lb

## 2017-01-09 DIAGNOSIS — M5441 Lumbago with sciatica, right side: Secondary | ICD-10-CM | POA: Diagnosis not present

## 2017-01-09 MED ORDER — NAPROXEN 500 MG PO TBEC: 500.0000 mg | DELAYED_RELEASE_TABLET | Freq: Two times a day (BID) | ORAL | 0 refills | Status: DC

## 2017-01-09 MED ORDER — CYCLOBENZAPRINE HCL 10 MG PO TABS: 10.0000 mg | ORAL_TABLET | Freq: Three times a day (TID) | ORAL | 0 refills | Status: DC | PRN

## 2017-01-09 MED FILL — CYCLOBENZAPRINE 10 MG TAB: 10 | 6 days supply | Qty: 20 | Fill #0

## 2017-01-09 MED FILL — NAPROXEN DR 500 MG TABLET: 500 | 15 days supply | Qty: 30 | Fill #0

## 2017-01-09 NOTE — Progress Notes (Signed)
Pre visit review using our clinic review tool, if applicable. No additional management support is needed unless otherwise documented below in the visit note. 

## 2017-01-09 NOTE — Progress Notes (Signed)
Musculoskeletal Exam  Patient: Stephanie Mcconnell DOB: July 25, 1971  DOS: 01/09/2017  SUBJECTIVE:  Chief Complaint:   Chief Complaint  Patient presents with  . Back Pain    lower pain radiating down the (R) leg-x started on last Thurs    Stephanie Mcconnell is a 46 y.o.  female for evaluation and treatment of his back pain.   Onset:  4 days ago. No injury-sudden.  Location: lower, b/l Character:  aching  Progression of issue:  is unchanged Associated symptoms: Some R leg weakness 2/2 pain, numbness and tingling (w hx of sciatica, feels similar) Denies bowel/bladder incontinence  Treatment: to date has been rest, ice and ASA x 1.   Neurovascular symptoms: no  ROS: Gastrointestinal: no bowel incontinence Genitourinary: No bladder incontinence Musculoskeletal/Extremities: +back pain Neurologic: no numbness, tingling no weakness   Past Medical History:  Diagnosis Date  . Allergy   . Bulging disc    lower back.   . Tuberculosis    Exposed to TB 2003 and recived tx but never dx'd   Past Surgical History:  Procedure Laterality Date  . ENDOSCOPIC VEIN LASER TREATMENT     [facility]  . HERNIA REPAIR    . MASS EXCISION  03/12/2012   Procedure: EXCISION MASS;  Surgeon: Madilyn Hook, DO;  Location: [facility];  Service: General;  Laterality: Right;  . VENTRAL HERNIA REPAIR  03/12/2012   Procedure: LAPAROSCOPIC VENTRAL HERNIA;  Surgeon: Madilyn Hook, DO;  Location: [facility];  Service: General;  Laterality: N/A;   No family history on file. Current Outpatient Prescriptions  Medication Sig Dispense Refill  . fluticasone (FLONASE) 50 MCG/ACT nasal spray Place 2 sprays into both nostrils daily. 16 g 6  . levocetirizine (XYZAL) 5 MG tablet Take 1 tablet (5 mg total) by mouth every evening. 30 tablet 5  . Multiple Vitamin (MULTIVITAMIN) capsule Take 1 capsule by mouth daily.    . tranexamic acid (LYSTEDA) 650 MG TABS tablet Take 1,300 mg by mouth 3 (three) times daily.    . cyclobenzaprine  (FLEXERIL) 10 MG tablet Take 1 tablet (10 mg total) by mouth 3 (three) times daily as needed for muscle spasms. 20 tablet 0  . naproxen (EC NAPROSYN) 500 MG EC tablet Take 1 tablet (500 mg total) by mouth 2 (two) times daily with a meal. 30 tablet 0   No Known Allergies Social History   Social History  . Marital status: Married  . Number of children: 1   Occupational History  . Nurse [employer], weekends   Social History Main Topics  . Smoking status: Never Smoker  . Smokeless tobacco: Never Used  . Alcohol use 0.6 oz/week    1 Glasses of wine per week     Comment: rarely  . Drug use: No  . Sexual activity: Yes    Partners: Male   Social History Narrative   From [place], [place]   Lives with her husband and their daughter.    Objective:  VITAL SIGNS: BP 112/90 (BP Location: Left Arm, Patient Position: Sitting, Cuff Size: Large)   Pulse 77   Temp 98.1 F (36.7 C) (Oral)   Ht 5\' 6"  (1.676 m)   Wt 291 lb 9.6 oz (132.3 kg)   LMP 01/02/2017 (Exact Date)   SpO2 99%   BMI 47.07 kg/m  Constitutional: Well formed, well developed. No acute distress. HENT: Normocephalic, atraumatic.  Moist mucous membranes.  Eyes:  EOM grossly intact. Conjunctiva normal.  Neck:  Full range of motion.   Cardiovascular: RRR, no murmurs Thorax & Lungs:  CTAB, no wheezing or rales  Extremities: No clubbing. No cyanosis. No edema.  Skin: Warm. Dry. No erythema. No rash.  Musculoskeletal: low back.   Tenderness to palpation: yes- over SI joint, paraspinal msk b/l Deformity: no Ecchymosis: no Straight leg test: negative for Neurologic: Normal sensory function. No focal deficits noted. DTR's equal and symmetry in LE's. No clonus. Psychiatric: Normal mood. Age appropriate judgment and insight. Alert & oriented x 3.    PROCEDURE NOTE After discussing the procedure and risks, including but not limited to increased pain or stiffness and rarely nausea or dizziness, verbal consent was  obtained.  Pre-procedure diagnosis: Somatic dysfunction Post-procedure diagnosis: Same Procedure: OMT  Regions treated include lumbar, sacral: Soft tissue/HVLA/MFR with the tissue response noted to be Improved.  The patient tolerated the procedure well, and there were no complications noted.  The patient was warned of the possibility of increased pain or stiffness of up to 48 hours duration and was asked to call with any unexpected problems.   Assessment:  Acute bilateral low back pain with right-sided sciatica - Plan: naproxen (EC NAPROSYN) 500 MG EC tablet, cyclobenzaprine (FLEXERIL) 10 MG tablet  Plan: Orders as above. Mild response to OMT. Home stretches and exercises given. Heat. F/u prn. SI injection and PT if no improvement. The patient voiced understanding and agreement to the plan.   [place], DO 01/09/17  11:40 AM

## 2017-01-09 NOTE — Patient Instructions (Addendum)
EXERCISES  RANGE OF MOTION (ROM) AND STRETCHING EXERCISES - Low Back Sprain Most people with lower back pain will find that their symptoms get worse with excessive bending forward (flexion) or arching at the lower back (extension). The exercises that will help resolve your symptoms will focus on the opposite motion.  Your physician, physical therapist or athletic trainer will help you determine which exercises will be most helpful to resolve your lower back pain. Do not complete any exercises without first consulting with your caregiver. Discontinue any exercises which make your symptoms worse, until you speak to your caregiver. If you have pain, numbness or tingling which travels down into your buttocks, leg or foot, the goal of the therapy is for these symptoms to move closer to your back and eventually resolve. Sometimes, these leg symptoms will get better, but your lower back pain may worsen. This is often an indication of progress in your rehabilitation. Be very alert to any changes in your symptoms and the activities in which you participated in the 24 hours prior to the change. Sharing this information with your caregiver will allow him or her to most efficiently treat your condition. These exercises may help you when beginning to rehabilitate your injury. Your symptoms may resolve with or without further involvement from your physician, physical therapist or athletic trainer. While completing these exercises, remember:   Restoring tissue flexibility helps normal motion to return to the joints. This allows healthier, less painful movement and activity.  An effective stretch should be held for at least 30 seconds.  A stretch should never be painful. You should only feel a gentle lengthening or release in the stretched tissue. FLEXION RANGE OF MOTION AND STRETCHING EXERCISES:  STRETCH - Flexion, Single Knee to Chest   Lie on a firm bed or floor with both legs extended in front of you.  Keeping  one leg in contact with the floor, bring your opposite knee to your chest. Hold your leg in place by either grabbing behind your thigh or at your knee.  Pull until you feel a gentle stretch in your low back. Hold 15-20 seconds.  Slowly release your grasp and repeat the exercise with the opposite side. Repeat 2 times. Complete this exercise 1-2 times per day.   STRETCH - Flexion, Double Knee to Chest  Lie on a firm bed or floor with both legs extended in front of you.  Keeping one leg in contact with the floor, bring your opposite knee to your chest.  Tense your stomach muscles to support your back and then lift your other knee to your chest. Hold your legs in place by either grabbing behind your thighs or at your knees.  Pull both knees toward your chest until you feel a gentle stretch in your low back. Hold 15-20 seconds.  Tense your stomach muscles and slowly return one leg at a time to the floor. Repeat 2 times. Complete this exercise 1-2 times per day.   STRETCH - Low Trunk Rotation  Lie on a firm bed or floor. Keeping your legs in front of you, bend your knees so they are both pointed toward the ceiling and your feet are flat on the floor.  Extend your arms out to the side. This will stabilize your upper body by keeping your shoulders in contact with the floor.  Gently and slowly drop both knees together to one side until you feel a gentle stretch in your low back. Hold for 15-20 seconds.  Tense your  stomach muscles to support your lower back as you bring your knees back to the starting position. Repeat the exercise to the other side. Repeat 2 times. Complete this exercise 1-2 times per day  EXTENSION RANGE OF MOTION AND FLEXIBILITY EXERCISES:  STRETCH - Extension, Prone on Elbows   Lie on your stomach on the floor, a bed will be too soft. Place your palms about shoulder width apart and at the height of your head.  Place your elbows under your shoulders. If this is too  painful, stack pillows under your chest.  Allow your body to relax so that your hips drop lower and make contact more completely with the floor.  Hold this position for 15-20 seconds.  Slowly return to lying flat on the floor. Repeat 2 times. Complete this exercise 1-2 times per day.   RANGE OF MOTION - Extension, Prone Press Ups  Lie on your stomach on the floor, a bed will be too soft. Place your palms about shoulder width apart and at the height of your head.  Keeping your back as relaxed as possible, slowly straighten your elbows while keeping your hips on the floor. You may adjust the placement of your hands to maximize your comfort. As you gain motion, your hands will come more underneath your shoulders.  Hold this position 15-20 seconds.  Slowly return to lying flat on the floor. Repeat 2 times. Complete this exercise 1-2 times per day.   RANGE OF MOTION- Quadruped, Neutral Spine   Assume a hands and knees position on a firm surface. Keep your hands under your shoulders and your knees under your hips. You may place padding under your knees for comfort.  Drop your head and point your tailbone toward the ground below you. This will round out your lower back like an angry cat. Hold this position for 15-20 seconds.  Slowly lift your head and release your tail bone so that your back sags into a large arch, like an old horse.  Hold this position for 15-20 seconds.  Repeat this until you feel limber in your low back.  Now, find your "sweet spot." This will be the most comfortable position somewhere between the two previous positions. This is your neutral spine. Once you have found this position, tense your stomach muscles to support your low back.  Hold this position for 15-20 seconds. Repeat 2 times. Complete this exercise 1-2 times per day.  STRENGTHENING EXERCISES - Low Back Sprain These exercises may help you when beginning to rehabilitate your injury. These exercises should  be done near your "sweet spot." This is the neutral, low-back arch, somewhere between fully rounded and fully arched, that is your least painful position. When performed in this safe range of motion, these exercises can be used for people who have either a flexion or extension based injury. These exercises may resolve your symptoms with or without further involvement from your physician, physical therapist or athletic trainer. While completing these exercises, remember:   Muscles can gain both the endurance and the strength needed for everyday activities through controlled exercises.  Complete these exercises as instructed by your physician, physical therapist or athletic trainer. Increase the resistance and repetitions only as guided.  You may experience muscle soreness or fatigue, but the pain or discomfort you are trying to eliminate should never worsen during these exercises. If this pain does worsen, stop and make certain you are following the directions exactly. If the pain is still present after adjustments, discontinue the  exercise until you can discuss the trouble with your caregiver.  STRENGTHENING - Deep Abdominals, Pelvic Tilt   Lie on a firm bed or floor. Keeping your legs in front of you, bend your knees so they are both pointed toward the ceiling and your feet are flat on the floor.  Tense your lower abdominal muscles to press your low back into the floor. This motion will rotate your pelvis so that your tail bone is scooping upwards rather than pointing at your feet or into the floor. With a gentle tension and even breathing, hold this position for 10-15 seconds. Repeat 2 times. Complete this exercise 1 time per day.   STRENGTHENING - Abdominals, Crunches   Lie on a firm bed or floor. Keeping your legs in front of you, bend your knees so they are both pointed toward the ceiling and your feet are flat on the floor. Cross your arms over your chest.  Slightly tip your chin down  without bending your neck.  Tense your abdominals and slowly lift your trunk high enough to just clear your shoulder blades. Lifting higher can put excessive stress on the lower back and does not further strengthen your abdominal muscles.  Control your return to the starting position. Repeat 2 times. Complete this exercise once every 1-2 days.   STRENGTHENING - Quadruped, Opposite UE/LE Lift   Assume a hands and knees position on a firm surface. Keep your hands under your shoulders and your knees under your hips. You may place padding under your knees for comfort.  Find your neutral spine and gently tense your abdominal muscles so that you can maintain this position. Your shoulders and hips should form a rectangle that is parallel with the floor and is not twisted.  Keeping your trunk steady, lift your right hand no higher than your shoulder and then your left leg no higher than your hip. Make sure you are not holding your breath. Hold this position for 15-20 seconds.  Continuing to keep your abdominal muscles tense and your back steady, slowly return to your starting position. Repeat with the opposite arm and leg. Repeat 2 times. Complete this exercise once every 1-2 days.   STRENGTHENING - Abdominals and Quadriceps, Straight Leg Raise   Lie on a firm bed or floor with both legs extended in front of you.  Keeping one leg in contact with the floor, bend the other knee so that your foot can rest flat on the floor.  Find your neutral spine, and tense your abdominal muscles to maintain your spinal position throughout the exercise.  Slowly lift your straight leg off the floor about 6 inches for a count of 15, making sure to not hold your breath.  Still keeping your neutral spine, slowly lower your leg all the way to the floor. Repeat this exercise with each leg 2 times. Complete this exercise once every 1-2 days. POSTURE AND BODY MECHANICS CONSIDERATIONS - Low Back Sprain Keeping correct  posture when sitting, standing or completing your activities will reduce the stress put on different body tissues, allowing injured tissues a chance to heal and limiting painful experiences. The following are general guidelines for improved posture. Your physician or physical therapist will provide you with any instructions specific to your needs. While reading these guidelines, remember:  The exercises prescribed by your provider will help you have the flexibility and strength to maintain correct postures.  The correct posture provides the best environment for your joints to work. All of your  joints have less wear and tear when properly supported by a spine with good posture. This means you will experience a healthier, less painful body.  Correct posture must be practiced with all of your activities, especially prolonged sitting and standing. Correct posture is as important when doing repetitive low-stress activities (typing) as it is when doing a single heavy-load activity (lifting).  RESTING POSITIONS Consider which positions are most painful for you when choosing a resting position. If you have pain with flexion-based activities (sitting, bending, stooping, squatting), choose a position that allows you to rest in a less flexed posture. You would want to avoid curling into a fetal position on your side. If your pain worsens with extension-based activities (prolonged standing, working overhead), avoid resting in an extended position such as sleeping on your stomach. Most people will find more comfort when they rest with their spine in a more neutral position, neither too rounded nor too arched. Lying on a non-sagging bed on your side with a pillow between your knees, or on your back with a pillow under your knees will often provide some relief. Keep in mind, being in any one position for a prolonged period of time, no matter how correct your posture, can still lead to stiffness. PROPER SITTING  POSTURE In order to minimize stress and discomfort on your spine, you must sit with correct posture. Sitting with good posture should be effortless for a healthy body. Returning to good posture is a gradual process. Many people can work toward this most comfortably by using various supports until they have the flexibility and strength to maintain this posture on their own. When sitting with proper posture, your ears will fall over your shoulders and your shoulders will fall over your hips. You should use the back of the chair to support your upper back. Your lower back will be in a neutral position, just slightly arched. You may place a small pillow or folded towel at the base of your lower back for  support.  When working at a desk, create an environment that supports good, upright posture. Without extra support, muscles tire, which leads to excessive strain on joints and other tissues. Keep these recommendations in mind:  CHAIR:  A chair should be able to slide under your desk when your back makes contact with the back of the chair. This allows you to work closely.  The chair's height should allow your eyes to be level with the upper part of your monitor and your hands to be slightly lower than your elbows.  BODY POSITION  Your feet should make contact with the floor. If this is not possible, use a foot rest.  Keep your ears over your shoulders. This will reduce stress on your neck and low back.  INCORRECT SITTING POSTURES  If you are feeling tired and unable to assume a healthy sitting posture, do not slouch or slump. This puts excessive strain on your back tissues, causing more damage and pain. Healthier options include:  Using more support, like a lumbar pillow.  Switching tasks to something that requires you to be upright or walking.  Talking a brief walk.  Lying down to rest in a neutral-spine position.  PROLONGED STANDING WHILE SLIGHTLY LEANING FORWARD  When completing a task  that requires you to lean forward while standing in one place for a long time, place either foot up on a stationary 2-4 inch high object to help maintain the best posture. When both feet are on the ground,  the lower back tends to lose its slight inward curve. If this curve flattens (or becomes too large), then the back and your other joints will experience too much stress, tire more quickly, and can cause pain.  CORRECT STANDING POSTURES Proper standing posture should be assumed with all daily activities, even if they only take a few moments, like when brushing your teeth. As in sitting, your ears should fall over your shoulders and your shoulders should fall over your hips. You should keep a slight tension in your abdominal muscles to brace your spine. Your tailbone should point down to the ground, not behind your body, resulting in an over-extended swayback posture.   INCORRECT STANDING POSTURES  Common incorrect standing postures include a forward head, locked knees and/or an excessive swayback. WALKING Walk with an upright posture. Your ears, shoulders and hips should all line-up.  PROLONGED ACTIVITY IN A FLEXED POSITION When completing a task that requires you to bend forward at your waist or lean over a low surface, try to find a way to stabilize 3 out of 4 of your limbs. You can place a hand or elbow on your thigh or rest a knee on the surface you are reaching across. This will provide you more stability, so that your muscles do not tire as quickly. By keeping your knees relaxed, or slightly bent, you will also reduce stress across your lower back. CORRECT LIFTING TECHNIQUES  DO :  Assume a wide stance. This will provide you more stability and the opportunity to get as close as possible to the object which you are lifting.  Tense your abdominals to brace your spine. Bend at the knees and hips. Keeping your back locked in a neutral-spine position, lift using your leg muscles. Lift with your  legs, keeping your back straight.  Test the weight of unknown objects before attempting to lift them.  Try to keep your elbows locked down at your sides in order get the best strength from your shoulders when carrying an object.     Always ask for help when lifting heavy or awkward objects. INCORRECT LIFTING TECHNIQUES DO NOT:   Lock your knees when lifting, even if it is a small object.  Bend and twist. Pivot at your feet or move your feet when needing to change directions.  Assume that you can safely pick up even a paperclip without proper posture.   If you are not improving, we can do injections in the area.

## 2017-01-11 DIAGNOSIS — H524 Presbyopia: Secondary | ICD-10-CM | POA: Diagnosis not present

## 2017-01-13 ENCOUNTER — Other Ambulatory Visit (INDEPENDENT_AMBULATORY_CARE_PROVIDER_SITE_OTHER): Payer: Self-pay

## 2017-01-13 DIAGNOSIS — M25562 Pain in left knee: Secondary | ICD-10-CM

## 2017-01-16 ENCOUNTER — Ambulatory Visit (HOSPITAL_BASED_OUTPATIENT_CLINIC_OR_DEPARTMENT_OTHER)
Admission: RE | Admit: 2017-01-16 | Discharge: 2017-01-16 | Disposition: A | Discharge status: Home / Self Care | Payer: 59 | Source: Ambulatory Visit | Attending: Orthopaedic Surgery | Admitting: Orthopaedic Surgery

## 2017-01-16 ENCOUNTER — Ambulatory Visit (INDEPENDENT_AMBULATORY_CARE_PROVIDER_SITE_OTHER): Payer: 59 | Admitting: Orthopaedic Surgery

## 2017-01-16 DIAGNOSIS — G8929 Other chronic pain: Secondary | ICD-10-CM | POA: Diagnosis not present

## 2017-01-16 DIAGNOSIS — M25562 Pain in left knee: Secondary | ICD-10-CM

## 2017-01-16 DIAGNOSIS — S8992XA Unspecified injury of left lower leg, initial encounter: Secondary | ICD-10-CM | POA: Diagnosis not present

## 2017-01-16 MED ORDER — METHYLPREDNISOLONE ACETATE 40 MG/ML IJ SUSP
40.0000 mg | INTRAMUSCULAR | Status: AC | PRN
  Administered 2017-01-16: 40 mg via INTRA_ARTICULAR

## 2017-01-16 NOTE — Progress Notes (Signed)
Office Visit Note   Patient: Stephanie [facility]           Date of Birth: 02-09-1971           MRN: [id number] Visit Date: 01/16/2017              Requested by: Stephanie Held, DO [facility] [address] [facility], [facility] [id number] PCP: Stephanie Held, DO   Assessment & Plan: Visit Diagnoses:  1. Chronic pain of left knee     Plan: I was able to show her quad strengthening exercises to try. We talked about weight loss. I did recommended a steroid injection for her left knee and expanded the risk and benefits of these injections. She is agreeable to try this. She tolerated the injection well. I will see her back in 3 weeks to see how she is doing overall. At that point we may consider an MRI if she still having the same type of pain.  Follow-Up Instructions: Return in about 3 weeks (around 02/06/2017).   Orders:  No orders of the defined types were placed in this encounter.  No orders of the defined types were placed in this encounter.     Procedures: Large Joint Inj Date/Time: 01/16/2017 8:21 AM Performed by: Stephanie Mcconnell Authorized by: Stephanie Mcconnell   Location:  Knee Site:  L knee Ultrasound Guidance: No   Fluoroscopic Guidance: No   Arthrogram: No   Medications:  40 mg methylPREDNISolone acetate 40 MG/ML     Clinical Data: No additional findings.   Subjective: No chief complaint on file. 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 She is morbidly obese individual who is been having left knee pain on her lateral medial side since landing awkwardly while jumping on a trampoline 2 years ago. She was told the time that she may have a bruise to the bone. A lot of activity causes pain to flare up again and again. She denies any swelling. She will start workout lose weight but the knee pain on her left knee is been a hindrance to her. Is now detrimentally affected sometimes her work as well as direct is daily living and her  mobility. She has not had any type of intervention in her knee in terms of steroid injection well.  HPI  Review of Systems She denies any chest pain, shortness of breath, fever, chills, nausea,  Objective: Vital Signs: LMP 01/02/2017 (Exact Date)   Physical Exam She is alert and oriented Ortho Exam  Examination of her left knee shows full range of motion. Her knee actually hyperextends. The knee feels ligamentously stable. She does have medial joint line tenderness. It's hard to get a good [place] exam due to the abundance of soft tissue around her knee. Specialty Comments:  No specialty comments available.  Imaging: Dg Knee 1-2 Views Left  Result Date: 01/16/2017 CLINICAL DATA:  Left knee pain and tenderness.  Fall 2 years ago. EXAM: LEFT KNEE - 1-2 VIEW COMPARISON:  08/08/2015. FINDINGS: No acute bony or joint abnormality identified. Subtle corticated lucency noted along the medial tibial plateau is most likely a vascular channel. No evidence of effusion . IMPRESSION: No acute or focal abnormality. Electronically Signed   By: Stephanie Mcconnell  Register   On: 01/16/2017 07:52     PMFS History: Patient Active Problem List   Diagnosis Date Noted  . Acute pharyngitis 02/02/2016  . Severe obesity (BMI >= 40) ([facility]) 08/08/2015  Past Medical History:  Diagnosis Date  . Allergy   . Bulging disc    lower back.   . Tuberculosis    Exposed to TB 2003 and recived tx but never dx'd    No family history on file.  Past Surgical History:  Procedure Laterality Date  . ENDOSCOPIC VEIN LASER TREATMENT     [facility]  . HERNIA REPAIR    . MASS EXCISION  03/12/2012   Procedure: EXCISION MASS;  Surgeon: Stephanie Hook, DO;  Location: [facility];  Service: General;  Laterality: Right;  . VENTRAL HERNIA REPAIR  03/12/2012   Procedure: LAPAROSCOPIC VENTRAL HERNIA;  Surgeon: Stephanie Hook, DO;  Location: [facility];  Service: General;  Laterality: N/A;   Social History   Occupational History  . Nurse [employer], weekends   Social History Main Topics  . Smoking status: Never Smoker  . Smokeless tobacco: Never Used  . Alcohol use 0.6 oz/week    1 Glasses of wine per week     Comment: rarely  . Drug use: No  . Sexual activity: Yes    Partners: Male

## 2017-01-27 ENCOUNTER — Telehealth: Payer: Self-pay | Admitting: Family Medicine

## 2017-01-27 DIAGNOSIS — Z1231 Encounter for screening mammogram for malignant neoplasm of breast: Secondary | ICD-10-CM

## 2017-01-27 NOTE — Telephone Encounter (Signed)
°  Relation to QG:BEEF Call back number:[phone]  Reason for call:  Patient requesting mamo orders please send to [facility] imaging, patient scheduled annual pap for 02/07/17 at 10:30am.

## 2017-01-27 NOTE — Telephone Encounter (Signed)
Order placed

## 2017-01-27 NOTE — Addendum Note (Signed)
Addended byDamita Dunnings D on: 01/27/2017 03:32 PM   Modules accepted: Orders

## 2017-02-06 ENCOUNTER — Ambulatory Visit (INDEPENDENT_AMBULATORY_CARE_PROVIDER_SITE_OTHER): Payer: 59 | Admitting: Orthopaedic Surgery

## 2017-02-06 ENCOUNTER — Encounter (HOSPITAL_BASED_OUTPATIENT_CLINIC_OR_DEPARTMENT_OTHER): Payer: Self-pay

## 2017-02-06 ENCOUNTER — Ambulatory Visit (HOSPITAL_BASED_OUTPATIENT_CLINIC_OR_DEPARTMENT_OTHER)
Admission: RE | Admit: 2017-02-06 | Discharge: 2017-02-06 | Disposition: A | Discharge status: Home / Self Care | Payer: 59 | Source: Ambulatory Visit | Attending: Family Medicine | Admitting: Family Medicine

## 2017-02-06 DIAGNOSIS — M25562 Pain in left knee: Secondary | ICD-10-CM

## 2017-02-06 DIAGNOSIS — Z1231 Encounter for screening mammogram for malignant neoplasm of breast: Secondary | ICD-10-CM | POA: Diagnosis not present

## 2017-02-06 NOTE — Progress Notes (Signed)
The patient is following up after a steroid injection her left knee. She said her knees felt wonderful since then. That was 3 weeks ago. She has been asymptomatic and has no issues. She states she is 100% better. Her knee at been painful since mechanical fall. She is obese and she's been working on weight loss and quad training exercises.  On examination of her left knee there is no effusion. The knee is ligamentously stable. She has full range of motion in her knee does hyperextend but otherwise no issues.  At this point she'll follow-up as needed. We talked about things she still needed due to work on strengthening her knees and losing weight and I will help her most the long run. She has any issues at anytime she'll let us know

## 2017-02-07 ENCOUNTER — Other Ambulatory Visit: Payer: Self-pay | Admitting: Family Medicine

## 2017-02-07 ENCOUNTER — Ambulatory Visit: Payer: 59 | Admitting: Family Medicine

## 2017-02-07 DIAGNOSIS — R928 Other abnormal and inconclusive findings on diagnostic imaging of breast: Secondary | ICD-10-CM

## 2017-02-15 ENCOUNTER — Ambulatory Visit
Admission: RE | Admit: 2017-02-15 | Discharge: 2017-02-15 | Disposition: A | Discharge status: Home / Self Care | Payer: 59 | Source: Ambulatory Visit | Attending: Family Medicine | Admitting: Family Medicine

## 2017-02-15 DIAGNOSIS — R928 Other abnormal and inconclusive findings on diagnostic imaging of breast: Secondary | ICD-10-CM

## 2017-02-15 DIAGNOSIS — N6323 Unspecified lump in the left breast, lower outer quadrant: Secondary | ICD-10-CM | POA: Diagnosis not present

## 2017-03-16 ENCOUNTER — Ambulatory Visit (INDEPENDENT_AMBULATORY_CARE_PROVIDER_SITE_OTHER): Payer: 59 | Admitting: Family Medicine

## 2017-03-16 ENCOUNTER — Encounter: Payer: Self-pay | Admitting: Family Medicine

## 2017-03-16 ENCOUNTER — Other Ambulatory Visit (HOSPITAL_COMMUNITY)
Admission: RE | Admit: 2017-03-16 | Discharge: 2017-03-16 | Disposition: A | Discharge status: Home / Self Care | Payer: 59 | Source: Ambulatory Visit | Attending: Family Medicine | Admitting: Family Medicine

## 2017-03-16 VITALS — BP 108/76 | HR 77 | Temp 98.2°F | Resp 16 | Ht 66.0 in | Wt 287.4 lb

## 2017-03-16 DIAGNOSIS — J069 Acute upper respiratory infection, unspecified: Secondary | ICD-10-CM

## 2017-03-16 DIAGNOSIS — Z01419 Encounter for gynecological examination (general) (routine) without abnormal findings: Secondary | ICD-10-CM | POA: Diagnosis not present

## 2017-03-16 MED ORDER — FLUTICASONE PROPIONATE 50 MCG/ACT NA SUSP: 2.0000 | Freq: Every day | NASAL | 6 refills | Status: DC

## 2017-03-16 NOTE — Patient Instructions (Signed)

## 2017-03-16 NOTE — Progress Notes (Signed)
Patient ID: Stephanie Mcconnell, female    DOB: 1971/02/16  Age: 46 y.o. MRN: [id number]    Subjective:  Subjective  HPI Stephanie Mcconnell presents for pap only.  We were not able to do pap with last ov because she was on her period.  No complaints.   Review of Systems  Constitutional: Negative for activity change, appetite change, fatigue, fever and unexpected weight change.  HENT: Negative for congestion.   Respiratory: Negative for cough and shortness of breath.   Cardiovascular: Negative for chest pain, palpitations and leg swelling.  Gastrointestinal: Negative for abdominal pain, blood in stool and nausea.  Genitourinary: Negative for dysuria and frequency.  Skin: Negative for rash.  Allergic/Immunologic: Negative for environmental allergies.  Neurological: Negative for dizziness and headaches.  Psychiatric/Behavioral: Negative for behavioral problems and dysphoric mood. The patient is not nervous/anxious.     History Past Medical History:  Diagnosis Date  . Allergy   . Bulging disc    lower back.   . Tuberculosis    Exposed to TB 2003 and recived tx but never dx'd    She has a past surgical history that includes Ventral hernia repair (03/12/2012); Mass excision (03/12/2012); Hernia repair; and Endoscopic vein laser treatment.   Her family history is not on file.She reports that she has never smoked. She has never used smokeless tobacco. She reports that she drinks about 0.6 oz of alcohol per week . She reports that she does not use drugs.  Current Outpatient Prescriptions on File Prior to Visit  Medication Sig Dispense Refill  . cyclobenzaprine (FLEXERIL) 10 MG tablet Take 1 tablet (10 mg total) by mouth 3 (three) times daily as needed for muscle spasms. 20 tablet 0  . levocetirizine (XYZAL) 5 MG tablet Take 1 tablet (5 mg total) by mouth every evening. 30 tablet 5  . Multiple Vitamin (MULTIVITAMIN) capsule Take 1 capsule by mouth daily.    . naproxen (EC NAPROSYN) 500 MG EC  tablet Take 1 tablet (500 mg total) by mouth 2 (two) times daily with a meal. 30 tablet 0  . tranexamic acid (LYSTEDA) 650 MG TABS tablet Take 1,300 mg by mouth 3 (three) times daily.     No current facility-administered medications on file prior to visit.      Objective:  Objective  Physical Exam  Abdominal: Soft. She exhibits no distension and no mass. There is no tenderness. There is no rebound, no guarding and no CVA tenderness.  Genitourinary: Vagina normal and uterus normal. Rectal exam shows no external hemorrhoid, no internal hemorrhoid, no fissure, no mass, no tenderness, anal tone normal and guaiac negative stool. Pelvic exam was performed with patient supine. There is no rash, tenderness, lesion or injury on the right labia. There is no rash, tenderness, lesion or injury on the left labia. Uterus is not deviated, not enlarged, not fixed and not tender. Cervix exhibits no motion tenderness, no discharge and no friability. Right adnexum displays no mass, no tenderness and no fullness. Left adnexum displays no mass, no tenderness and no fullness. No erythema or tenderness in the vagina. No vaginal discharge found.  Nursing note and vitals reviewed.  BP 108/76 (BP Location: Left Arm, Cuff Size: Normal)   Pulse 77   Temp 98.2 F (36.8 C) (Oral)   Resp 16   Ht 5\' 6"  (1.676 m)   Wt 287 lb 6.4 oz (130.4 kg)   LMP 03/05/2017   SpO2 97%   BMI 46.39 kg/m  Wt Readings  from Last 3 Encounters:  03/16/17 287 lb 6.4 oz (130.4 kg)  01/09/17 291 lb 9.6 oz (132.3 kg)  10/11/16 282 lb 9.6 oz (128.2 kg)     Lab Results  Component Value Date   WBC 5.5 05/25/2015   HGB 13.7 05/25/2015   HCT 40.7 05/25/2015   PLT 219.0 05/25/2015   GLUCOSE 93 05/25/2015   CHOL 175 05/25/2015   TRIG 72.0 05/25/2015   HDL 55.60 05/25/2015   LDLCALC 105 (H) 05/25/2015   ALT 17 05/25/2015   AST 20 05/25/2015   NA 138 05/25/2015   K 3.8 05/25/2015   CL 103 05/25/2015   CREATININE 0.67 05/25/2015   BUN  13 05/25/2015   CO2 27 05/25/2015   TSH 1.02 05/25/2015    US Breast Ltd Uni Left Inc Axilla  Result Date: 02/15/2017 CLINICAL DATA:  Patient returns after screening study for evaluation of a possible left breast mass. EXAM: ULTRASOUND OF THE LEFT BREAST COMPARISON:  02/06/2017 FINDINGS: On physical exam, I palpate no abnormality in the lateral aspect of the left breast. Targeted ultrasound is performed, showing a circumscribed oval anechoic parallel mass with associated posterior acoustic enhancement in the 3 o'clock location of the left breast 6 cm from nipple which measures 1.4 x 1.4 x 0.8 cm. No associated solid component or acoustic shadowing identified. IMPRESSION: Simple cyst in the 3 o'clock location of the left breast accounting for the mammographic finding. No ultrasound evidence for malignancy. RECOMMENDATION: Screening mammogram in one year.(Code:[id number]-B-01Y) I have discussed the findings and recommendations with the patient. Results were also provided in writing at the conclusion of the visit. If applicable, a reminder letter will be sent to the patient regarding the next appointment. BI-RADS CATEGORY  2: Benign. Electronically Signed   By: Nolon Nations M.D.   On: 02/15/2017 09:31     Assessment & Plan:  Plan  I am having Ms. Branscomb maintain her multivitamin, tranexamic acid, levocetirizine, naproxen, cyclobenzaprine, and fluticasone.  Meds ordered this encounter  Medications  . fluticasone (FLONASE) 50 MCG/ACT nasal spray    Sig: Place 2 sprays into both nostrils daily.    Dispense:  16 g    Refill:  6    Problem List Items Addressed This Visit    None    Visit Diagnoses    Visit for gynecologic examination    -  Primary   Relevant Orders   Cytology - PAP   Acute upper respiratory infection       Relevant Medications   fluticasone (FLONASE) 50 MCG/ACT nasal spray      Follow-up: Return for annual exam, fasting.  Ann Held, DO

## 2017-03-21 ENCOUNTER — Other Ambulatory Visit: Payer: Self-pay | Admitting: Family Medicine

## 2017-03-21 ENCOUNTER — Ambulatory Visit (INDEPENDENT_AMBULATORY_CARE_PROVIDER_SITE_OTHER): Payer: 59 | Admitting: Family Medicine

## 2017-03-21 ENCOUNTER — Encounter: Payer: Self-pay | Admitting: Family Medicine

## 2017-03-21 VITALS — BP 116/82 | HR 81 | Temp 98.6°F | Resp 16 | Ht 66.0 in | Wt 284.2 lb

## 2017-03-21 DIAGNOSIS — Z0000 Encounter for general adult medical examination without abnormal findings: Secondary | ICD-10-CM | POA: Diagnosis not present

## 2017-03-21 DIAGNOSIS — J069 Acute upper respiratory infection, unspecified: Secondary | ICD-10-CM

## 2017-03-21 LAB — POC URINALSYSI DIPSTICK (AUTOMATED)
Bilirubin, UA: NEGATIVE
GLUCOSE UA: NEGATIVE
Ketones, UA: NEGATIVE
Leukocytes, UA: NEGATIVE
NITRITE UA: NEGATIVE
PROTEIN UA: NEGATIVE
RBC UA: NEGATIVE
UROBILINOGEN UA: 0.2 U/dL
pH, UA: 6 (ref 5.0–8.0)

## 2017-03-21 LAB — CBC WITH DIFFERENTIAL/PLATELET
BASOS PCT: 0.8 % (ref 0.0–3.0)
Basophils Absolute: 0 10*3/uL (ref 0.0–0.1)
Eosinophils Absolute: 0.1 10*3/uL (ref 0.0–0.7)
Eosinophils Relative: 1.3 % (ref 0.0–5.0)
HEMATOCRIT: 40.3 % (ref 36.0–46.0)
HEMOGLOBIN: 13.2 g/dL (ref 12.0–15.0)
LYMPHS PCT: 45.5 % (ref 12.0–46.0)
Lymphs Abs: 2.5 10*3/uL (ref 0.7–4.0)
MCHC: 32.9 g/dL (ref 30.0–36.0)
MCV: 84 fl (ref 78.0–100.0)
MONO ABS: 0.4 10*3/uL (ref 0.1–1.0)
Monocytes Relative: 7.5 % (ref 3.0–12.0)
Neutro Abs: 2.5 10*3/uL (ref 1.4–7.7)
Neutrophils Relative %: 44.9 % (ref 43.0–77.0)
PLATELETS: 252 10*3/uL (ref 150.0–400.0)
RBC: 4.8 Mil/uL (ref 3.87–5.11)
RDW: 16 % — AB (ref 11.5–15.5)
WBC: 5.5 10*3/uL (ref 4.0–10.5)

## 2017-03-21 LAB — COMPREHENSIVE METABOLIC PANEL
ALBUMIN: 4.4 g/dL (ref 3.5–5.2)
ALK PHOS: 47 U/L (ref 39–117)
ALT: 13 U/L (ref 0–35)
AST: 15 U/L (ref 0–37)
BUN: 12 mg/dL (ref 6–23)
CO2: 26 mEq/L (ref 19–32)
Calcium: 9.5 mg/dL (ref 8.4–10.5)
Chloride: 105 mEq/L (ref 96–112)
Creatinine, Ser: 0.72 mg/dL (ref 0.40–1.20)
GFR: 112.51 mL/min (ref >60.00)
GLUCOSE: 100 mg/dL — AB (ref 70–99)
Potassium: 3.7 mEq/L (ref 3.5–5.1)
SODIUM: 139 meq/L (ref 135–145)
Total Bilirubin: 0.5 mg/dL (ref 0.2–1.2)
Total Protein: 7.2 g/dL (ref 6.0–8.3)

## 2017-03-21 LAB — CYTOLOGY - PAP
ADEQUACY: ABSENT
Bacterial vaginitis: NEGATIVE
CANDIDA VAGINITIS: NEGATIVE
Chlamydia: NEGATIVE
Diagnosis: NEGATIVE
HPV (WINDOPATH): NOT DETECTED
Neisseria Gonorrhea: NEGATIVE
TRICH (WINDOWPATH): NEGATIVE

## 2017-03-21 LAB — LIPID PANEL
CHOLESTEROL: 203 mg/dL — AB (ref 0–200)
HDL: 50.6 mg/dL (ref >39.00)
LDL Cholesterol: 136 mg/dL — ABNORMAL HIGH (ref 0–99)
NONHDL: 151.92
TRIGLYCERIDES: 79 mg/dL (ref 0.0–149.0)
Total CHOL/HDL Ratio: 4
VLDL: 15.8 mg/dL (ref 0.0–40.0)

## 2017-03-21 LAB — TSH: TSH: 0.57 u[IU]/mL (ref 0.35–4.50)

## 2017-03-21 MED FILL — LEVOCETIRIZINE 5 MG TABLET: 5 | 30 days supply | Qty: 30 | Fill #0

## 2017-03-21 NOTE — Patient Instructions (Signed)
Preventive Care 40-64 Years, Female Preventive care refers to lifestyle choices and visits with your health care provider that can promote health and wellness. What does preventive care include?  A yearly physical exam. This is also called an annual well check.  Dental exams once or twice a year.  Routine eye exams. Ask your health care provider how often you should have your eyes checked.  Personal lifestyle choices, including: ? Daily care of your teeth and gums. ? Regular physical activity. ? Eating a healthy diet. ? Avoiding tobacco and drug use. ? Limiting alcohol use. ? Practicing safe sex. ? Taking low-dose aspirin daily starting at age 58. ? Taking vitamin and mineral supplements as recommended by your health care provider. What happens during an annual well check? The services and screenings done by your health care provider during your annual well check will depend on your age, overall health, lifestyle risk factors, and family history of disease. Counseling Your health care provider may ask you questions about your:  Alcohol use.  Tobacco use.  Drug use.  Emotional well-being.  Home and relationship well-being.  Sexual activity.  Eating habits.  Work and work Statistician.  Method of birth control.  Menstrual cycle.  Pregnancy history.  Screening You may have the following tests or measurements:  Height, weight, and BMI.  Blood pressure.  Lipid and cholesterol levels. These may be checked every 5 years, or more frequently if you are over 81 years old.  Skin check.  Lung cancer screening. You may have this screening every year starting at age 78 if you have a 30-pack-year history of smoking and currently smoke or have quit within the past 15 years.  Fecal occult blood test (FOBT) of the stool. You may have this test every year starting at age 65.  Flexible sigmoidoscopy or colonoscopy. You may have a sigmoidoscopy every 5 years or a colonoscopy  every 10 years starting at age 30.  Hepatitis C blood test.  Hepatitis B blood test.  Sexually transmitted disease (STD) testing.  Diabetes screening. This is done by checking your blood sugar (glucose) after you have not eaten for a while (fasting). You may have this done every 1-3 years.  Mammogram. This may be done every 1-2 years. Talk to your health care provider about when you should start having regular mammograms. This may depend on whether you have a family history of breast cancer.  BRCA-related cancer screening. This may be done if you have a family history of breast, ovarian, tubal, or peritoneal cancers.  Pelvic exam and Pap test. This may be done every 3 years starting at age 80. Starting at age 36, this may be done every 5 years if you have a Pap test in combination with an HPV test.  Bone density scan. This is done to screen for osteoporosis. You may have this scan if you are at high risk for osteoporosis.  Discuss your test results, treatment options, and if necessary, the need for more tests with your health care provider. Vaccines Your health care provider may recommend certain vaccines, such as:  Influenza vaccine. This is recommended every year.  Tetanus, diphtheria, and acellular pertussis (Tdap, Td) vaccine. You may need a Td booster every 10 years.  Varicella vaccine. You may need this if you have not been vaccinated.  Zoster vaccine. You may need this after age 5.  Measles, mumps, and rubella (MMR) vaccine. You may need at least one dose of MMR if you were born in  1957 or later. You may also need a second dose.  Pneumococcal 13-valent conjugate (PCV13) vaccine. You may need this if you have certain conditions and were not previously vaccinated.  Pneumococcal polysaccharide (PPSV23) vaccine. You may need one or two doses if you smoke cigarettes or if you have certain conditions.  Meningococcal vaccine. You may need this if you have certain  conditions.  Hepatitis A vaccine. You may need this if you have certain conditions or if you travel or work in places where you may be exposed to hepatitis A.  Hepatitis B vaccine. You may need this if you have certain conditions or if you travel or work in places where you may be exposed to hepatitis B.  Haemophilus influenzae type b (Hib) vaccine. You may need this if you have certain conditions.  Talk to your health care provider about which screenings and vaccines you need and how often you need them. This information is not intended to replace advice given to you by your health care provider. Make sure you discuss any questions you have with your health care provider. Document Released: 10/16/2015 Document Revised: 06/08/2016 Document Reviewed: 07/21/2015 Elsevier Interactive Patient Education  2017 [employer].

## 2017-03-21 NOTE — Progress Notes (Signed)
Subjective:   I acted as a Education administrator for Dr. Carollee Herter.  Guerry Bruin, CMA   Stephanie Mcconnell is a 46 y.o. female and is here for a comprehensive physical exam. The patient reports no problems.  Social History   Social History  . Marital status: Married    Spouse name: N/A  . Number of children: 1  . Years of education: N/A   Occupational History  . Nurse [employer], weekends   Social History Main Topics  . Smoking status: Never Smoker  . Smokeless tobacco: Never Used  . Alcohol use 0.6 oz/week    1 Glasses of wine per week     Comment: rarely  . Drug use: No  . Sexual activity: Yes    Partners: Male   Other Topics Concern  . Not on file   Social History Narrative   From [place], [place]   Lives with her husband and their daughter.   Exercise-walking   Health Maintenance  Topic Date Due  . INFLUENZA VACCINE  05/03/2017  . MAMMOGRAM  02/06/2018  . TETANUS/TDAP  10/03/2018  . PAP SMEAR  03/16/2020  . HIV Screening  Completed    The following portions of the patient's history were reviewed and updated as appropriate:  She  has a past medical history of Allergy; Bulging disc; and Tuberculosis. She  does not have any pertinent problems on file. She  has a past surgical history that includes Ventral hernia repair (03/12/2012); Mass excision (03/12/2012); Hernia repair; and Endoscopic vein laser treatment. Her family history is not on file. She  reports that she has never smoked. She has never used smokeless tobacco. She reports that she drinks about 0.6 oz of alcohol per week . She reports that she does not use drugs. She has a current medication list which includes the following prescription(s): cyclobenzaprine, fluticasone, levocetirizine, multivitamin, naproxen, and tranexamic acid. Current Outpatient Prescriptions on File Prior to Visit  Medication Sig Dispense Refill  . cyclobenzaprine (FLEXERIL) 10 MG tablet Take 1 tablet (10 mg total) by mouth 3 (three) times  daily as needed for muscle spasms. 20 tablet 0  . fluticasone (FLONASE) 50 MCG/ACT nasal spray Place 2 sprays into both nostrils daily. 16 g 6  . levocetirizine (XYZAL) 5 MG tablet Take 1 tablet (5 mg total) by mouth every evening. 30 tablet 5  . Multiple Vitamin (MULTIVITAMIN) capsule Take 1 capsule by mouth daily.    . naproxen (EC NAPROSYN) 500 MG EC tablet Take 1 tablet (500 mg total) by mouth 2 (two) times daily with a meal. 30 tablet 0  . tranexamic acid (LYSTEDA) 650 MG TABS tablet Take 1,300 mg by mouth 3 (three) times daily.     No current facility-administered medications on file prior to visit.    She has No Known Allergies..  Review of Systems Review of Systems  Constitutional: Negative for activity change, appetite change and fatigue.  HENT: Negative for hearing loss, congestion, tinnitus and ear discharge.  dentist q39m Eyes: Negative for visual disturbance (see optho q1y -- vision corrected to 20/20 with glasses).  Respiratory: Negative for cough, chest tightness and shortness of breath.   Cardiovascular: Negative for chest pain, palpitations and leg swelling.  Gastrointestinal: Negative for abdominal pain, diarrhea, constipation and abdominal distention.  Genitourinary: Negative for urgency, frequency, decreased urine volume and difficulty urinating.  Musculoskeletal: Negative for back pain, arthralgias and gait problem.  Skin: Negative for color change, pallor and rash.  Neurological: Negative  for dizziness, light-headedness, numbness and headaches.  Hematological: Negative for adenopathy. Does not bruise/bleed easily.  Psychiatric/Behavioral: Negative for suicidal ideas, confusion, sleep disturbance, self-injury, dysphoric mood, decreased concentration and agitation.       Objective:    BP 116/82 (BP Location: Right Arm, Cuff Size: Large)   Pulse 81   Temp 98.6 F (37 C) (Oral)   Resp 16   Ht 5\' 6"  (1.676 m)   Wt 284 lb 3.2 oz (128.9 kg)   LMP 03/05/2017    SpO2 98%   BMI 45.87 kg/m  General appearance: alert, cooperative, appears stated age and no distress Head: Normocephalic, without obvious abnormality, atraumatic Eyes: conjunctivae/corneas clear. PERRL, EOM's intact. Fundi benign. Ears: normal TM's and external ear canals both ears Nose: Nares normal. Septum midline. Mucosa normal. No drainage or sinus tenderness. Throat: lips, mucosa, and tongue normal; teeth and gums normal Neck: no adenopathy, no carotid bruit, no JVD, supple, symmetrical, trachea midline and thyroid not enlarged, symmetric, no tenderness/mass/nodules Back: symmetric, no curvature. ROM normal. No CVA tenderness. Lungs: clear to auscultation bilaterally Breasts: normal appearance, no masses or tenderness Heart: regular rate and rhythm, S1, S2 normal, no murmur, click, rub or gallop Abdomen: soft, non-tender; bowel sounds normal; no masses,  no organomegaly Extremities: extremities normal, atraumatic, no cyanosis or edema Pulses: 2+ and symmetric Skin: Skin color, texture, turgor normal. No rashes or lesions Lymph nodes: Cervical, supraclavicular, and axillary nodes normal. Neurologic: Alert and oriented X 3, normal strength and tone. Normal symmetric reflexes. Normal coordination and gait    Assessment:    Healthy female exam.      Plan:    ghm utd Check labs See After Visit Summary for Counseling Recommendations    1. Preventative health care See above - POCT Urinalysis Dipstick (Automated) - CBC with Differential/Platelet - Comprehensive metabolic panel - Lipid panel - TSH

## 2017-05-12 ENCOUNTER — Encounter: Payer: Self-pay | Admitting: Family Medicine

## 2017-05-12 ENCOUNTER — Ambulatory Visit (INDEPENDENT_AMBULATORY_CARE_PROVIDER_SITE_OTHER): Payer: 59 | Admitting: Family Medicine

## 2017-05-12 VITALS — BP 100/82 | HR 79 | Temp 98.6°F | Ht 66.0 in | Wt 280.8 lb

## 2017-05-12 DIAGNOSIS — J020 Streptococcal pharyngitis: Secondary | ICD-10-CM

## 2017-05-12 LAB — POCT RAPID STREP A (OFFICE): Rapid Strep A Screen: POSITIVE — AB

## 2017-05-12 MED ORDER — AMOXICILLIN 500 MG PO CAPS: 1000.0000 mg | ORAL_CAPSULE | Freq: Every day | ORAL | 0 refills | Status: AC

## 2017-05-12 MED FILL — AMOXICILLIN 500 MG CAPSULE: 500 | 10 days supply | Qty: 20 | Fill #0

## 2017-05-12 NOTE — Addendum Note (Signed)
Addended by: Harl Bowie on: 05/12/2017 05:54 PM   Modules accepted: Orders

## 2017-05-12 NOTE — Patient Instructions (Signed)
Ibuprofen 400-600 mg (2-3 over the counter strength tabs) every 6 hours as needed for pain.  OK to take Tylenol 1000 mg (2 extra strength tabs) or 975 mg (3 regular strength tabs) every 6 hours as needed.  Salt water gargles, lozenges may be helpful.  Continue to push fluids, practice good hand hygiene, and cover your mouth if you cough.  If things worsen, let us know.

## 2017-05-12 NOTE — Progress Notes (Signed)
SUBJECTIVE:   Stephanie Mcconnell is a 46 y.o. female presents to the clinic for:  Chief Complaint  Patient presents with  . Sore Throat    started on Wed    Complains of sore throat for 2 days.  Other associated symptoms: R ear pain.  Denies: fevers, cough, runny/stuffy nose, SOB, myalgias, rigors Sick Contacts: none; was at pool Therapy to date: none  History  Smoking Status  . Never Smoker  Smokeless Tobacco  . Never Used    ROS: Pertinent items are noted in HPI  Patient's medications, allergies, past medical, surgical, social and family histories were reviewed and updated as appropriate.  OBJECTIVE:  BP 100/82 (BP Location: Left Arm, Patient Position: Sitting, Cuff Size: Large)   Pulse 79   Temp 98.6 F (37 C) (Oral)   Ht 5\' 6"  (1.676 m)   Wt 280 lb 12.8 oz (127.4 kg)   LMP 04/06/2017 (Approximate)   SpO2 98%   BMI 45.32 kg/m  General: Awake, alert, appearing stated age Eyes: conjunctivae and sclerae clear Ears: normal TMs bilaterally Nose: no visible exudate Oropharynx: lips, mucosa, and tongue normal; teeth and gums normal and pharynx and tonsils mildly erythematous, no exudates Neck: supple, +mildly tender cervical adenopathy b/l Lungs: clear to auscultation, no wheezes, rales or rhonchi, symmetric air entry, normal effort Heart: rate and rhythm regular Skin:reveals no rash Psych: Age appropriate judgment and insight  ASSESSMENT/PLAN:  Strep throat - Plan: amoxicillin (AMOXIL) 500 MG capsule  Orders as above. Rapid strep positive. 1 g daily for 10 days.  Continue to practice good hand hygiene and push fluids. Ibuprofen and acetaminophen for pain. +/- salt water gargles, lozenges. Replace toothbrush after 24 hours of being on abx. F/u prn. Pt voiced understanding and agreement to the plan.  [place], DO 05/12/17 11:47 AM

## 2017-08-10 ENCOUNTER — Encounter: Payer: Self-pay | Admitting: Family Medicine

## 2017-08-10 ENCOUNTER — Ambulatory Visit: Payer: 59 | Admitting: Family Medicine

## 2017-08-10 ENCOUNTER — Ambulatory Visit (HOSPITAL_BASED_OUTPATIENT_CLINIC_OR_DEPARTMENT_OTHER)
Admission: RE | Admit: 2017-08-10 | Discharge: 2017-08-10 | Disposition: A | Discharge status: Home / Self Care | Payer: 59 | Source: Ambulatory Visit | Attending: Family Medicine | Admitting: Family Medicine

## 2017-08-10 ENCOUNTER — Ambulatory Visit (INDEPENDENT_AMBULATORY_CARE_PROVIDER_SITE_OTHER): Payer: 59 | Admitting: Family Medicine

## 2017-08-10 VITALS — BP 120/82 | Temp 98.2°F | Ht 65.0 in | Wt 284.0 lb

## 2017-08-10 DIAGNOSIS — G8929 Other chronic pain: Secondary | ICD-10-CM

## 2017-08-10 DIAGNOSIS — M25571 Pain in right ankle and joints of right foot: Secondary | ICD-10-CM

## 2017-08-10 DIAGNOSIS — M7989 Other specified soft tissue disorders: Secondary | ICD-10-CM | POA: Diagnosis not present

## 2017-08-10 DIAGNOSIS — M79671 Pain in right foot: Secondary | ICD-10-CM | POA: Diagnosis not present

## 2017-08-10 MED ORDER — MELOXICAM 15 MG PO TABS: 15.0000 mg | ORAL_TABLET | Freq: Every day | ORAL | 2 refills | Status: DC

## 2017-08-10 MED FILL — MELOXICAM 15 MG TABLET: 15 | 30 days supply | Qty: 30 | Fill #0

## 2017-08-10 NOTE — Progress Notes (Signed)
Patient ID: Stephanie Mcconnell, female    DOB: 23-Jan-1971  Age: 46 y.o. MRN: [id number]    Subjective:  Subjective  HPI Stephanie Mcconnell presents for pain in r foot -- x 1 month.  She thinks she may have walked into furniture or something but she is not sure.  The top of her foot and med ankle swollen and tender.  Walking is painful.    Review of Systems  Constitutional: Negative for appetite change, diaphoresis, fatigue and unexpected weight change.  Eyes: Negative for pain, redness and visual disturbance.  Respiratory: Negative for cough, chest tightness, shortness of breath and wheezing.   Cardiovascular: Negative for chest pain, palpitations and leg swelling.  Endocrine: Negative for cold intolerance, heat intolerance, polydipsia, polyphagia and polyuria.  Genitourinary: Negative for difficulty urinating, dysuria and frequency.  Musculoskeletal: Positive for arthralgias and joint swelling.  Neurological: Negative for dizziness, light-headedness, numbness and headaches.    History Past Medical History:  Diagnosis Date  . Allergy   . Bulging disc    lower back.   . Tuberculosis    Exposed to TB 2003 and recived tx but never dx'd    She has a past surgical history that includes Hernia repair; Endoscopic vein laser treatment; LAPAROSCOPIC VENTRAL HERNIA (N/A, 03/12/2012); INSERTION OF MESH (N/A, 03/12/2012); and EXCISION MASS (Right, 03/12/2012).   Her family history is not on file.She reports that  has never smoked. she has never used smokeless tobacco. She reports that she drinks about 0.6 oz of alcohol per week. She reports that she does not use drugs.  Current Outpatient Medications on File Prior to Visit  Medication Sig Dispense Refill  . cyclobenzaprine (FLEXERIL) 10 MG tablet Take 1 tablet (10 mg total) by mouth 3 (three) times daily as needed for muscle spasms. 20 tablet 0  . fluticasone (FLONASE) 50 MCG/ACT nasal spray Place 2 sprays into both nostrils daily. 16 g 6  .  levocetirizine (XYZAL) 5 MG tablet TAKE 1 TABLET (5 MG TOTAL) BY MOUTH EVERY EVENING. 30 tablet 5  . Multiple Vitamin (MULTIVITAMIN) capsule Take 1 capsule by mouth daily.    . naproxen (EC NAPROSYN) 500 MG EC tablet Take 1 tablet (500 mg total) by mouth 2 (two) times daily with a meal. 30 tablet 0  . tranexamic acid (LYSTEDA) 650 MG TABS tablet Take 1,300 mg by mouth 3 (three) times daily.     No current facility-administered medications on file prior to visit.      Objective:  Objective  Physical Exam  Constitutional: She is oriented to person, place, and time. She appears well-developed and well-nourished.  HENT:  Head: Normocephalic and atraumatic.  Eyes: Conjunctivae and EOM are normal.  Neck: Normal range of motion. Neck supple. No JVD present. Carotid bruit is not present. No thyromegaly present.  Cardiovascular: Normal rate, regular rhythm and normal heart sounds.  No murmur heard. Pulmonary/Chest: Effort normal and breath sounds normal. No respiratory distress. She has no wheezes. She has no rales. She exhibits no tenderness.  Musculoskeletal: She exhibits edema and tenderness.       Right ankle: She exhibits swelling. Tenderness. Medial malleolus tenderness found.  Neurological: She is alert and oriented to person, place, and time.  Psychiatric: She has a normal mood and affect.  Nursing note and vitals reviewed.  BP 120/82   Temp 98.2 F (36.8 C)   Ht 5\' 5"  (1.651 m)   Wt 284 lb (128.8 kg)   LMP 07/12/2017 (Exact Date)  SpO2 98%   BMI 47.26 kg/m  Wt Readings from Last 3 Encounters:  08/10/17 284 lb (128.8 kg)  08/10/17 284 lb (128.8 kg)  05/12/17 280 lb 12.8 oz (127.4 kg)     Lab Results  Component Value Date   WBC 5.5 03/21/2017   HGB 13.2 03/21/2017   HCT 40.3 03/21/2017   PLT 252.0 03/21/2017   GLUCOSE 100 (H) 03/21/2017   CHOL 203 (H) 03/21/2017   TRIG 79.0 03/21/2017   HDL 50.60 03/21/2017   LDLCALC 136 (H) 03/21/2017   ALT 13 03/21/2017   AST 15  03/21/2017   NA 139 03/21/2017   K 3.7 03/21/2017   CL 105 03/21/2017   CREATININE 0.72 03/21/2017   BUN 12 03/21/2017   CO2 26 03/21/2017   TSH 0.57 03/21/2017    No results found.   Assessment & Plan:  Plan  I am having Stephanie Mcconnell maintain her multivitamin, tranexamic acid, naproxen, cyclobenzaprine, fluticasone, and levocetirizine.  No orders of the defined types were placed in this encounter.   Problem List Items Addressed This Visit    None    Visit Diagnoses    Chronic pain of right ankle    -  Primary   Relevant Orders   Ambulatory referral to Sports Medicine   DG Foot Complete Right (Completed)    pt did not want a splint  Pt to see sport med  Follow-up: Return if symptoms worsen or fail to improve.  Ann Held, DO

## 2017-08-10 NOTE — Patient Instructions (Signed)
Foot Sprain A foot sprain is an injury to one of the strong bands of tissue (ligaments) that connect and support the many bones in your feet. The ligament can be stretched too much or it can tear. A tear can be either partial or complete. The severity of the sprain depends on how much of the ligament was damaged or torn. What are the causes? A foot sprain is usually caused by suddenly twisting or pivoting your foot. What increases the risk? This injury is more likely to occur in people who:  Play a sport, such as basketball or football.  Exercise or play a sport without warming up.  Start a new workout or sport.  Suddenly increase how long or hard they exercise or play a sport.  What are the signs or symptoms? Symptoms of this condition start soon after an injury and include:  Pain, especially in the arch of the foot.  Bruising.  Swelling.  Inability to walk or use the foot to support body weight.  How is this diagnosed? This condition is diagnosed with a medical history and physical exam. You may also have imaging tests, such as:  X-rays to make sure there are no broken bones (fractures).  MRI to see if the ligament has torn.  How is this treated? Treatment varies depending on the severity of your sprain. Mild sprains can be treated with rest, ice, compression, and elevation (RICE). If your ligament is overstretched or partially torn, treatment usually involves keeping your foot in a fixed position (immobilization) for a period of time. To help you do this, your health care provider will apply a bandage, splint, or walking boot to keep your foot from moving until it heals. You may also be advised to use crutches or a scooter for a few weeks to avoid bearing weight on your foot while it is healing. If your ligament is fully torn, you may need surgery to reconnect the ligament to the bone. After surgery, a cast or splint will be applied and will need to stay on your foot while it  heals. Your health care provider may also suggest exercises or physical therapy to strengthen your foot. Follow these instructions at home: If You Have a Bandage, Splint, or Walking Boot:  Wear it as directed by your health care provider. Remove it only as directed by your health care provider.  Loosen the bandage, splint, or walking boot if your toes become numb and tingle, or if they turn cold and blue. Bathing  If your health care provider approves bathing and showering, cover the bandage or splint with a watertight plastic bag to protect it from water. Do not let the bandage or splint get wet. Managing pain, stiffness, and swelling  If directed, apply ice to the injured area: ? Put ice in a plastic bag. ? Place a towel between your skin and the bag. ? Leave the ice on for 20 minutes, 2-3 times per day.  Move your toes often to avoid stiffness and to lessen swelling.  Raise (elevate) the injured area above the level of your heart while you are sitting or lying down. Driving  Do not drive or operate heavy machinery while taking pain medicine.  Ask your health care provider when it is safe to drive if you have a bandage, splint, or walking boot on your foot. Activity  Rest as directed by your health care provider.  Do not use the injured foot to support your body weight until your health   care provider says that you can. Use crutches or other supportive devices as directed by your health care provider.  Ask your health care provider what activities are safe for you. Gradually increase how much and how far you walk until your health care provider says it is safe to return to full activity.  Do any exercise or physical therapy as directed by your health care provider. General instructions  If a splint was applied, do not put pressure on any part of it until it is fully hardened. This may take several hours.  Take medicines only as directed by your health care provider. These  include over-the-counter medicines and prescription medicines.  Keep all follow-up visits as directed by your health care provider. This is important.  When you can walk without pain, wear supportive shoes that have stiff soles. Do not wear flip-flops, and do not walk barefoot. Contact a health care provider if:  Your pain is not controlled with medicine.  Your bruising or swelling gets worse or does not get better with treatment.  Your splint or walking boot is damaged. Get help right away if:  You develop severe numbness or tingling in your foot.  Your foot turns blue, white, or gray, and it feels cold. This information is not intended to replace advice given to you by your health care provider. Make sure you discuss any questions you have with your health care provider. Document Released: 03/11/2002 Document Revised: 02/25/2016 Document Reviewed: 07/23/2014 Elsevier Interactive Patient Education  2018 Elsevier Inc.  

## 2017-08-10 NOTE — Patient Instructions (Signed)
You have tendinitis of your anterior tibialis tendon and your posterior tibialis tendon. Consider cam walker when up and walking around. Avoid flat shoes and barefoot walking as much as possible. Icing 15 minutes at a time 3-4 times a day. Meloxicam 15mg  daily with food for pain and inflammation - take daily. Do home exercises with theraband 3 sets of 10 once a day. Consider physical therapy, nitro patches if you're struggling. Follow up with me in 5-6 weeks for reevaluation.

## 2017-08-12 ENCOUNTER — Encounter: Payer: Self-pay | Admitting: Family Medicine

## 2017-08-12 DIAGNOSIS — M79671 Pain in right foot: Secondary | ICD-10-CM | POA: Insufficient documentation

## 2017-08-12 NOTE — Progress Notes (Signed)
PCP and consultation requested by: Ann Held, DO  Subjective:   HPI: Patient is a 46 y.o. female here for right foot pain.  Patient denies known injury or trauma. She states for about a month now she's had worsening dorsal foot/anterior ankle pain right side. Some swelling. Worse at end of work day. She is a Chartered certified accountant at [employer]. Pain level 8/10 and sharp. No skin changes, numbness. No issues left foot/ankle.  Past Medical History:  Diagnosis Date  . Allergy   . Bulging disc    lower back.   . Tuberculosis    Exposed to TB 2003 and recived tx but never dx'd    Current Outpatient Medications on File Prior to Visit  Medication Sig Dispense Refill  . cyclobenzaprine (FLEXERIL) 10 MG tablet Take 1 tablet (10 mg total) by mouth 3 (three) times daily as needed for muscle spasms. 20 tablet 0  . fluticasone (FLONASE) 50 MCG/ACT nasal spray Place 2 sprays into both nostrils daily. 16 g 6  . levocetirizine (XYZAL) 5 MG tablet TAKE 1 TABLET (5 MG TOTAL) BY MOUTH EVERY EVENING. 30 tablet 5  . Multiple Vitamin (MULTIVITAMIN) capsule Take 1 capsule by mouth daily.    . naproxen (EC NAPROSYN) 500 MG EC tablet Take 1 tablet (500 mg total) by mouth 2 (two) times daily with a meal. 30 tablet 0  . tranexamic acid (LYSTEDA) 650 MG TABS tablet Take 1,300 mg by mouth 3 (three) times daily.     No current facility-administered medications on file prior to visit.     Past Surgical History:  Procedure Laterality Date  . ENDOSCOPIC VEIN LASER TREATMENT     [facility]  . HERNIA REPAIR      No Known Allergies  Social History   Socioeconomic History  . Marital status: Married    Spouse name: Not on file  . Number of children: 1  . Years of education: Not on file  . Highest education level: Not on file  Social Needs  . Financial resource strain: Not on file  . Food insecurity - worry: Not on file  . Food insecurity - inability: Not on file  . Transportation needs -  medical: Not on file  . Transportation needs - non-medical: Not on file  Occupational History  . 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 Smoking status: Never Smoker  . Smokeless tobacco: Never Used  Substance and Sexual Activity  . Alcohol use: Yes    Alcohol/week: 0.6 oz    Types: 1 Glasses of wine per week    Comment: rarely  . Drug use: No  . Sexual activity: Yes    Partners: Male  Other Topics Concern  . Not on file  Social History Narrative   From [place], [place]   Lives with her husband and their daughter.   Exercise-walking    History reviewed. No pertinent family history.  Ht 5\' 5"  (1.651 m)   Wt 284 lb (128.8 kg)   LMP 07/12/2017 (Exact Date)   BMI 47.26 kg/m   Review of Systems: See HPI above.     Objective:  Physical Exam:  Gen: NAD, comfortable in exam room  Right foot/ankle: No gross deformity, swelling, ecchymoses FROM without pain - 5/5 strength TTP over anterior tibialis and posterior tibialis tendon. Negative ant drawer and talar tilt.   Negative syndesmotic compression. Thompsons test negative. NV intact distally.  Left foot/ankle: No gross deformity,  swelling, ecchymoses FROM with 5/5 strength. No TTP NV intact distally.  MSK u/s:  No significant abnormalities noted of right foot or ankle - no partial tearing of posterior tib or anterior tib tendons - posterior tibialis tendon is thickened.  No tenosynovitis.   Assessment & Plan:  1. Right foot/ankle pain - 2/2 tendinopathy of anterior tibialis and posterior tibialis tendons.  Icing, start meloxicam.  Cam walker when up and walking around.  Shown home exercises to do daily.  Consider physical therapy, nitro patches.  F/u in 5-6 weeks.

## 2017-08-12 NOTE — Assessment & Plan Note (Signed)
2/2 tendinopathy of anterior tibialis and posterior tibialis tendons.  Icing, start meloxicam.  Cam walker when up and walking around.  Shown home exercises to do daily.  Consider physical therapy, nitro patches.  F/u in 5-6 weeks.

## 2017-09-06 MED FILL — MELOXICAM 15 MG TABLET: 15 | 30 days supply | Qty: 30 | Fill #1

## 2017-09-21 ENCOUNTER — Ambulatory Visit: Payer: 59 | Admitting: Family Medicine

## 2017-09-21 ENCOUNTER — Encounter: Payer: Self-pay | Admitting: Family Medicine

## 2017-09-21 DIAGNOSIS — M79671 Pain in right foot: Secondary | ICD-10-CM

## 2017-09-21 NOTE — Progress Notes (Signed)
PCP and consultation requested by: Ann Held, DO  Subjective:   HPI: Patient is a 46 y.o. female here for right foot pain.  11/8: Patient denies known injury or trauma. She states for about a month now she's had worsening dorsal foot/anterior ankle pain right side. Some swelling. Worse at end of work day. She is a Chartered certified accountant at [employer]. Pain level 8/10 and sharp. No skin changes, numbness. No issues left foot/ankle.  12/20: Patient reports she feels about 90+% better compared to last visit. mobic helped and she's still taking this. Doing home exercises. Used cam walker for a couple weeks and travels with this just in case. Can feel some pain if she steps the wrong way but otherwise doing well. Pain currently 0/10. No skin changes.  Past Medical History:  Diagnosis Date  . Allergy   . Bulging disc    lower back.   . Tuberculosis    Exposed to TB 2003 and recived tx but never dx'd    Current Outpatient Medications on File Prior to Visit  Medication Sig Dispense Refill  . cyclobenzaprine (FLEXERIL) 10 MG tablet Take 1 tablet (10 mg total) by mouth 3 (three) times daily as needed for muscle spasms. 20 tablet 0  . fluticasone (FLONASE) 50 MCG/ACT nasal spray Place 2 sprays into both nostrils daily. 16 g 6  . levocetirizine (XYZAL) 5 MG tablet TAKE 1 TABLET (5 MG TOTAL) BY MOUTH EVERY EVENING. 30 tablet 5  . meloxicam (MOBIC) 15 MG tablet Take 1 tablet (15 mg total) daily by mouth. 30 tablet 2  . Multiple Vitamin (MULTIVITAMIN) capsule Take 1 capsule by mouth daily.    . naproxen (EC NAPROSYN) 500 MG EC tablet Take 1 tablet (500 mg total) by mouth 2 (two) times daily with a meal. 30 tablet 0  . tranexamic acid (LYSTEDA) 650 MG TABS tablet Take 1,300 mg by mouth 3 (three) times daily.     No current facility-administered medications on file prior to visit.     Past Surgical History:  Procedure Laterality Date  . ENDOSCOPIC VEIN LASER TREATMENT     [facility]  . HERNIA REPAIR    . MASS EXCISION  03/12/2012   Procedure: EXCISION MASS;  Surgeon: Madilyn Hook, DO;  Location: [facility];  Service: General;  Laterality: Right;  . VENTRAL HERNIA REPAIR  03/12/2012   Procedure: LAPAROSCOPIC VENTRAL HERNIA;  Surgeon: Madilyn Hook, DO;  Location: [facility];  Service: General;  Laterality: N/A;    No Known Allergies  Social History   Socioeconomic History  . Marital status: Married    Spouse name: Not on file  . Number of children: 1  . Years of education: Not on file  . Highest education level: Not on file  Social Needs  . Financial resource strain: Not on file  . Food insecurity - worry: Not on file  . Food insecurity - inability: Not on file  . Transportation needs - medical: Not on file  . Transportation needs - non-medical: Not on file  Occupational History  . 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 Smoking status: Never Smoker  . Smokeless tobacco: Never Used  Substance and Sexual Activity  . Alcohol use: Yes    Alcohol/week: 0.6 oz    Types: 1 Glasses of wine per week    Comment: rarely  . Drug use: No  . Sexual activity: Yes    Partners: Male  Other Topics Concern  . Not on file  Social History Narrative   From [place], [place]   Lives with her husband and their daughter.   Exercise-walking    History reviewed. No pertinent family history.  BP (!) 135/93   Ht 5\' 5"  (1.651 m)   Wt 280 lb (127 kg)   BMI 46.59 kg/m   Review of Systems: See HPI above.     Objective:  Physical Exam:  Gen: NAD, comfortable in exam room:  Right foot/ankle: No gross deformity, swelling, ecchymoses FROM TTP Negative ant drawer and talar tilt.   Negative syndesmotic compression. Thompsons test negative. NV intact distally.   Assessment & Plan:  1. Right foot/ankle pain - 2/2 tendinopathy of anterior tibialis and posterior tibialis tendons.  Much better compared to last visit.  Switch to taking meloxicam  if needed.  Home exercises 2-3 times a week for another month.  F/u prn.

## 2017-09-21 NOTE — Patient Instructions (Signed)
You have tendinitis of your anterior tibialis tendon and your posterior tibialis tendon. Take meloxicam only as needed now. I'd still do the home exercises 2-3 times a week for another month. Follow up with me as needed.

## 2017-09-21 NOTE — Assessment & Plan Note (Signed)
2/2 tendinopathy of anterior tibialis and posterior tibialis tendons.  Much better compared to last visit.  Switch to taking meloxicam if needed.  Home exercises 2-3 times a week for another month.  F/u prn.

## 2017-11-01 ENCOUNTER — Encounter: Payer: Self-pay | Admitting: Family Medicine

## 2017-11-01 ENCOUNTER — Ambulatory Visit (INDEPENDENT_AMBULATORY_CARE_PROVIDER_SITE_OTHER): Payer: No Typology Code available for payment source | Admitting: Family Medicine

## 2017-11-01 VITALS — BP 114/80 | HR 72 | Temp 97.6°F | Ht 65.0 in | Wt 285.0 lb

## 2017-11-01 DIAGNOSIS — J029 Acute pharyngitis, unspecified: Secondary | ICD-10-CM | POA: Diagnosis not present

## 2017-11-01 LAB — POCT RAPID STREP A (OFFICE): RAPID STREP A SCREEN: NEGATIVE

## 2017-11-01 MED ORDER — AMOXICILLIN 500 MG PO CAPS: 1000.0000 mg | ORAL_CAPSULE | Freq: Every day | ORAL | 0 refills | Status: DC

## 2017-11-01 MED FILL — AMOXICILLIN 500 MG CAPSULE: 500 | 10 days supply | Qty: 20 | Fill #0

## 2017-11-01 NOTE — Patient Instructions (Addendum)
We will send you a MyChart message on Friday to let you know if you need to continue the antibiotic.   Continue to push fluids, practice good hand hygiene, and cover your mouth if you cough.  If you start having fevers, shaking or shortness of breath, seek immediate care.  Let us know if you need anything.

## 2017-11-01 NOTE — Addendum Note (Signed)
Addended by: Sharon Seller B on: 11/01/2017 09:48 AM   Modules accepted: Orders

## 2017-11-01 NOTE — Progress Notes (Signed)
Pre visit review using our clinic review tool, if applicable. No additional management support is needed unless otherwise documented below in the visit note. 

## 2017-11-01 NOTE — Progress Notes (Signed)
SUBJECTIVE:   Stephanie Mcconnell is a 47 y.o. female presents to the clinic for:  Chief Complaint  Patient presents with  . Sore Throat    Complains of sore throat for 2 days.  Other associated symptoms: rhinorrhea, ear pain and sore throat.  Denies: sinus pain, itchy watery eyes, ear drainage, wheezing, shortness of breath, myalgia, cough, fevers/rigors Sick Contacts: daughter w strep Therapy to date: none  Social History   Tobacco Use  Smoking Status Never Smoker  Smokeless Tobacco Never Used    ROS: Pertinent items are noted in HPI  Patient's medications, allergies, past medical, surgical, social and family histories were reviewed and updated as appropriate.  OBJECTIVE:  BP 114/80 (BP Location: Left Arm, Patient Position: Sitting, Cuff Size: Large)   Pulse 72   Temp 97.6 F (36.4 C) (Oral)   Ht 5\' 5"  (1.651 m)   Wt 285 lb (129.3 kg)   SpO2 99%   BMI 47.43 kg/m  General: Awake, alert, appearing stated age Eyes: conjunctivae and sclerae clear Ears: normal TMs bilaterally Nose: no visible exudate Oropharynx: lips, mucosa, and tongue normal; teeth and gums normal Neck: supple, +ttp over cervical adenopathy Lungs: clear to auscultation, no wheezes, rales or rhonchi, symmetric air entry, normal effort Heart: rate and rhythm regular Skin:reveals no rash Psych: Age appropriate judgment and insight  ASSESSMENT/PLAN:  Pharyngitis, unspecified etiology - Plan: amoxicillin (AMOXIL) 500 MG capsule  Orders as above.  Continue to practice good hand hygiene and push fluids. Ibuprofen and acetaminophen for pain. Replace toothbrush after 24 hours of being on abx. Given exposure, will tx. Culture, will notify via MyChart if she needs to continue.  F/u prn. Pt voiced understanding and agreement to the plan.  [place], DO 11/01/17 9:38 AM

## 2017-11-02 ENCOUNTER — Telehealth: Payer: Self-pay | Admitting: Family Medicine

## 2017-11-02 NOTE — Telephone Encounter (Signed)
Pt  Was   Seen  Yesterday by   DR Nani Ravens      Placed  On  Amoxicillin    Taking  flonase    And   XYZAL    That  She  Already  Had .    Pt   Is  Being tx   For  Possible  Throat infection .  Pt  Reports   Throat   Is  Dry    Nose  Has  Becoming  Stuffy  . Pt  Reports  Some  Sinus  Pain   And  stuffyness in  Head . Pt is  Requesting a  Z  PAC  That  Has  Worked  In past .  Please   Call  Patient  [phone]

## 2017-11-02 NOTE — Telephone Encounter (Signed)
Copied from [facility]. Topic: Quick Communication - See Telephone Encounter >> Nov 02, 2017  3:53 PM [name], NT wrote: CRM for notification. See Telephone encounter for: Pt was in yesterday and received amoxicillin for strep throat. She wants to see if a zpac can be called in? Her sinuses are bad today. Uses [employer].  11/02/17.

## 2017-11-03 LAB — CULTURE, GROUP A STREP
MICRO NUMBER: 90127903
SPECIMEN QUALITY:: ADEQUATE

## 2017-11-03 MED ORDER — AZITHROMYCIN 250 MG PO TABS: ORAL_TABLET | ORAL | 0 refills | Status: DC

## 2017-11-03 MED FILL — AZITHROMYCIN 250 MG TABLET: 250 | 5 days supply | Qty: 6 | Fill #0

## 2017-11-03 NOTE — Telephone Encounter (Signed)
Called left message to call back 

## 2017-11-03 NOTE — Telephone Encounter (Signed)
OK to change Amox to Zpak.

## 2017-11-03 NOTE — Telephone Encounter (Signed)
Patient informed sent in and informed strep negative

## 2017-11-03 NOTE — Telephone Encounter (Signed)
Is she taking the amoxicillin?

## 2017-11-03 NOTE — Telephone Encounter (Signed)
She is taking it and states she is worse than before. Symptoms are stopped up and having ear pain, states feels much worse than when she came in. Throat is better but all other symptoms are worse--she is taking the amoxicillin and will go elsewhere to get zpack if necessary

## 2017-12-21 ENCOUNTER — Ambulatory Visit (INDEPENDENT_AMBULATORY_CARE_PROVIDER_SITE_OTHER): Payer: No Typology Code available for payment source | Admitting: Family Medicine

## 2017-12-21 ENCOUNTER — Encounter: Payer: Self-pay | Admitting: Family Medicine

## 2017-12-21 VITALS — BP 120/76 | HR 67 | Temp 98.2°F | Resp 16 | Ht 65.0 in | Wt 283.6 lb

## 2017-12-21 DIAGNOSIS — J302 Other seasonal allergic rhinitis: Secondary | ICD-10-CM | POA: Diagnosis not present

## 2017-12-21 DIAGNOSIS — H1031 Unspecified acute conjunctivitis, right eye: Secondary | ICD-10-CM

## 2017-12-21 MED ORDER — MONTELUKAST SODIUM 10 MG PO TABS: 10.0000 mg | ORAL_TABLET | Freq: Every day | ORAL | 3 refills | Status: DC

## 2017-12-21 MED ORDER — AZELASTINE HCL 0.1 % NA SOLN: 1.0000 | Freq: Two times a day (BID) | NASAL | 12 refills | Status: DC

## 2017-12-21 MED ORDER — MOXIFLOXACIN HCL 0.5 % OP SOLN: 1.0000 [drp] | Freq: Three times a day (TID) | OPHTHALMIC | 0 refills | Status: AC

## 2017-12-21 MED FILL — MONTELUKAST SOD 10 MG TAB: 10 | 30 days supply | Qty: 30 | Fill #0

## 2017-12-21 MED FILL — MOXIFLOXACIN 0.5% EYE DROPS: 0.5 | 20 days supply | Qty: 3 | Fill #0

## 2017-12-21 MED FILL — AZELASTINE HCL 137 MCG/SPRA: 137 | 50 days supply | Qty: 30 | Fill #0

## 2017-12-21 NOTE — Patient Instructions (Signed)
Allergic Conjunctivitis A clear membrane (conjunctiva) covers the white part of your eye and the inner surface of your eyelid. Allergic conjunctivitis happens when this membrane has inflammation. This is caused by allergies. Common causes of allergic reactions (allergens)include:  Outdoor allergens, such as:  Pollen.  Grass and weeds.  Mold spores.  Indoor allergens, such as:  Dust.  Smoke.  Mold.  Pet dander.  Animal hair. This condition can make your eye red or pink. It can also make your eye feel itchy. This condition cannot be spread from one person to another person (is not contagious). Follow these instructions at home:  Try not to be around things that you are allergic to.  Take or apply over-the-counter and prescription medicines only as told by your doctor. These include any eye drops.  Place a cool, clean washcloth on your eye for 10-20 minutes. Do this 3-4 times a day.  Do not touch or rub your eyes.  Do not wear contact lenses until the inflammation is gone. Wear glasses instead.  Do not wear eye makeup until the inflammation is gone.  Keep all follow-up visits as told by your doctor. This is important. Contact a doctor if:  Your symptoms get worse.  Your symptoms do not get better with treatment.  You have mild eye pain.  You are sensitive to light,  You have spots or blisters on your eyes.  You have pus coming from your eye.  You have a fever. Get help right away if:  You have redness, swelling, or other symptoms in only one eye.  Your vision is blurry.  You have vision changes.  You have very bad eye pain. Summary  Allergic conjunctivitis is caused by allergies. It can make your eye red or pink, and it can make your eye feel itchy.  This condition cannot be spread from one person to another person (is not contagious).  Try not to be around things that you are allergic to.  Take or apply over-the-counter and prescription medicines  only as told by your doctor. These include any eye drops.  Contact your doctor if your symptoms get worse or they do not get better with treatment. This information is not intended to replace advice given to you by your health care provider. Make sure you discuss any questions you have with your health care provider. Document Released: 03/09/2010 Document Revised: 05/13/2016 Document Reviewed: 05/13/2016 Elsevier Interactive Patient Education  2017 [employer] Inc.  

## 2017-12-21 NOTE — Progress Notes (Signed)
Patient ID: Stephanie Mcconnell, female   DOB: 1971/06/05, 47 y.o.   MRN: [id number]    Subjective:  I acted as a Education administrator for Dr. Carollee Herter.  Guerry Bruin, [place]   Patient ID: Stephanie Mcconnell, female    DOB: 01-10-1971, 47 y.o.   MRN: [id number]  Chief Complaint  Patient presents with  . Eye Problem    right eye red    HPI Patient is in today for right eye red.  Started this morning.  +discharge   Past Medical History:  Diagnosis Date  . Allergy   . Bulging disc    lower back.   . Tuberculosis    Exposed to TB 2003 and recived tx but never dx'd    Past Surgical History:  Procedure Laterality Date  . ENDOSCOPIC VEIN LASER TREATMENT      Veins  . HERNIA REPAIR    . MASS EXCISION  03/12/2012   Procedure: EXCISION MASS;  Surgeon: Madilyn Hook, DO;  Location: [facility];  Service: General;  Laterality: Right;  . VENTRAL HERNIA REPAIR  03/12/2012   Procedure: LAPAROSCOPIC VENTRAL HERNIA;  Surgeon: Madilyn Hook, DO;  Location: [facility];  Service: General;  Laterality: N/A;    No family history on file.  Social History   Socioeconomic History  . Marital status: Married    Spouse name: Not on file  . Number of children: 1  . Years of education: Not on file  . Highest education level: Not on file  Occupational History  . 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 Financial resource strain: Not on file  . Food insecurity:    Worry: Not on file    Inability: Not on file  . Transportation needs:    Medical: Not on file    Non-medical: Not on file  Tobacco Use  . Smoking status: Never Smoker  . Smokeless tobacco: Never Used  Substance and Sexual Activity  . Alcohol use: Yes    Alcohol/week: 0.6 oz    Types: 1 Glasses of wine per week    Comment: rarely  . Drug use: No  . Sexual activity: Yes    Partners: Male  Lifestyle  . Physical activity:    Days per week: Not on file    Minutes per session: Not on file  . Stress: Not on file  Relationships  .  Social connections:    Talks on phone: Not on file    Gets together: Not on file    Attends religious service: Not on file    Active member of club or organization: Not on file    Attends meetings of clubs or organizations: Not on file    Relationship status: Not on file  . Intimate partner violence:    Fear of current or ex partner: Not on file    Emotionally abused: Not on file    Physically abused: Not on file    Forced sexual activity: Not on file  Other Topics Concern  . Not on file  Social History Narrative   From [place], [place]   Lives with her husband and their daughter.   Exercise-walking    Outpatient Medications Prior to Visit  Medication Sig Dispense Refill  . fluticasone (FLONASE) 50 MCG/ACT nasal spray Place 2 sprays into both nostrils daily. 16 g 6  . levocetirizine (XYZAL) 5 MG tablet TAKE 1 TABLET (5 MG TOTAL) BY MOUTH EVERY EVENING. 30 tablet 5  . Multiple  Vitamin (MULTIVITAMIN) capsule Take 1 capsule by mouth daily.    . tranexamic acid (LYSTEDA) 650 MG TABS tablet Take 1,300 mg by mouth 3 (three) times daily.    Marland Kitchen azithromycin (ZITHROMAX) 250 MG tablet Take 2 tabs the first day and then 1 tab daily until you run out. 6 tablet 0   No facility-administered medications prior to visit.     No Known Allergies  Review of Systems  Constitutional: Negative for fever and malaise/fatigue.  HENT: Negative for congestion.   Eyes: Positive for discharge and redness. Negative for blurred vision.  Respiratory: Negative for cough and shortness of breath.   Cardiovascular: Negative for chest pain, palpitations and leg swelling.  Gastrointestinal: Negative for vomiting.  Musculoskeletal: Negative for back pain.  Skin: Negative for rash.  Neurological: Negative for loss of consciousness and headaches.       Objective:    Physical Exam  Constitutional: She is oriented to person, place, and time. She appears well-developed and well-nourished.  HENT:  Head:  Normocephalic and atraumatic.  Right Ear: Hearing, tympanic membrane, external ear and ear canal normal.  Left Ear: Hearing, tympanic membrane, external ear and ear canal normal.  Nose: Rhinorrhea present. Right sinus exhibits no maxillary sinus tenderness and no frontal sinus tenderness. Left sinus exhibits no maxillary sinus tenderness and no frontal sinus tenderness.  Mouth/Throat: Oropharynx is clear and moist.  Eyes: Pupils are equal, round, and reactive to light. EOM are normal. Lids are everted and swept, no foreign bodies found. Right eye exhibits discharge. Right eye exhibits no chemosis, no exudate and no hordeolum. No foreign body present in the right eye. Left eye exhibits no chemosis, no discharge, no exudate and no hordeolum. No foreign body present in the left eye. Right conjunctiva is injected. Right conjunctiva has no hemorrhage. Left conjunctiva is not injected. Left conjunctiva has no hemorrhage.  Neck: Normal range of motion. Neck supple. No JVD present. Carotid bruit is not present. No thyromegaly present.  Cardiovascular: Normal rate, regular rhythm and normal heart sounds.  No murmur heard. Pulmonary/Chest: Effort normal and breath sounds normal. No respiratory distress. She has no wheezes. She has no rales. She exhibits no tenderness.  Musculoskeletal: She exhibits no edema.  Neurological: She is alert and oriented to person, place, and time.  Psychiatric: She has a normal mood and affect.  Nursing note and vitals reviewed.   BP 120/76 (BP Location: Left Arm, Cuff Size: Large)   Pulse 67   Temp 98.2 F (36.8 C) (Oral)   Resp 16   Ht 5\' 5"  (1.651 m)   Wt 283 lb 9.6 oz (128.6 kg)   LMP 11/23/2017   SpO2 94%   BMI 47.19 kg/m  Wt Readings from Last 3 Encounters:  12/21/17 283 lb 9.6 oz (128.6 kg)  11/01/17 285 lb (129.3 kg)  09/21/17 280 lb (127 kg)     Lab Results  Component Value Date   WBC 5.5 03/21/2017   HGB 13.2 03/21/2017   HCT 40.3 03/21/2017   PLT  252.0 03/21/2017   GLUCOSE 100 (H) 03/21/2017   CHOL 203 (H) 03/21/2017   TRIG 79.0 03/21/2017   HDL 50.60 03/21/2017   LDLCALC 136 (H) 03/21/2017   ALT 13 03/21/2017   AST 15 03/21/2017   NA 139 03/21/2017   K 3.7 03/21/2017   CL 105 03/21/2017   CREATININE 0.72 03/21/2017   BUN 12 03/21/2017   CO2 26 03/21/2017   TSH 0.57 03/21/2017    Lab  Results  Component Value Date   TSH 0.57 03/21/2017   Lab Results  Component Value Date   WBC 5.5 03/21/2017   HGB 13.2 03/21/2017   HCT 40.3 03/21/2017   MCV 84.0 03/21/2017   PLT 252.0 03/21/2017   Lab Results  Component Value Date   NA 139 03/21/2017   K 3.7 03/21/2017   CO2 26 03/21/2017   GLUCOSE 100 (H) 03/21/2017   BUN 12 03/21/2017   CREATININE 0.72 03/21/2017   BILITOT 0.5 03/21/2017   ALKPHOS 47 03/21/2017   AST 15 03/21/2017   ALT 13 03/21/2017   PROT 7.2 03/21/2017   ALBUMIN 4.4 03/21/2017   CALCIUM 9.5 03/21/2017   GFR 112.51 03/21/2017   Lab Results  Component Value Date   CHOL 203 (H) 03/21/2017   Lab Results  Component Value Date   HDL 50.60 03/21/2017   Lab Results  Component Value Date   LDLCALC 136 (H) 03/21/2017   Lab Results  Component Value Date   TRIG 79.0 03/21/2017   Lab Results  Component Value Date   CHOLHDL 4 03/21/2017   No results found for: HGBA1C     Assessment & Plan:   Problem List Items Addressed This Visit    None    Visit Diagnoses    Seasonal allergies    -  Primary   Relevant Medications   montelukast (SINGULAIR) 10 MG tablet   azelastine (ASTELIN) 0.1 % nasal spray   Acute conjunctivitis of right eye, unspecified acute conjunctivitis type       Relevant Medications   moxifloxacin (VIGAMOX) 0.5 % ophthalmic solution    cool compresses Wash linens  If no better 2-3 days see opth  I have discontinued Malaka A. Holwerda's azithromycin. I am also having her start on montelukast, azelastine, and moxifloxacin. Additionally, I am having her maintain her  multivitamin, tranexamic acid, fluticasone, and levocetirizine.  Meds ordered this encounter  Medications  . montelukast (SINGULAIR) 10 MG tablet    Sig: Take 1 tablet (10 mg total) by mouth at bedtime.    Dispense:  30 tablet    Refill:  3  . azelastine (ASTELIN) 0.1 % nasal spray    Sig: Place 1 spray into both nostrils 2 (two) times daily. Use in each nostril as directed    Dispense:  30 mL    Refill:  12  . moxifloxacin (VIGAMOX) 0.5 % ophthalmic solution    Sig: Place 1 drop into the right eye 3 (three) times daily for 7 days.    Dispense:  3 mL    Refill:  0    CMA served as scribe during this visit. History, Physical and Plan performed by medical provider. Documentation and orders reviewed and attested to.  Ann Held, DO

## 2018-02-05 ENCOUNTER — Other Ambulatory Visit: Payer: Self-pay | Admitting: Family Medicine

## 2018-02-05 DIAGNOSIS — Z1231 Encounter for screening mammogram for malignant neoplasm of breast: Secondary | ICD-10-CM

## 2018-03-02 ENCOUNTER — Ambulatory Visit
Admission: RE | Admit: 2018-03-02 | Discharge: 2018-03-02 | Disposition: A | Discharge status: Home / Self Care | Payer: No Typology Code available for payment source | Source: Ambulatory Visit | Attending: Family Medicine | Admitting: Family Medicine

## 2018-03-02 DIAGNOSIS — Z1231 Encounter for screening mammogram for malignant neoplasm of breast: Secondary | ICD-10-CM

## 2018-03-07 ENCOUNTER — Telehealth: Payer: Self-pay | Admitting: Family Medicine

## 2018-03-07 NOTE — Telephone Encounter (Signed)
Copied from [place] [id number]. Topic: Quick Communication - See Telephone Encounter >> Mar 07, 2018  2:07 PM [name], NT wrote: CRM for notification. See Telephone encounter for: 03/07/18. Pt states she is going to [place] next month and wants to know if the office can give all the immunizations required to go to [place]? Please advise.

## 2018-03-09 NOTE — Telephone Encounter (Signed)
Not all--- some she will have to get at travel clinic

## 2018-03-12 NOTE — Telephone Encounter (Signed)
Author relayed Dr. Nonda Lou response regarding receiving all necessary immunizations prior to travel to [place]. Pt. scheduled for an OV with Dr. Etter Sjogren on 6/21 at 84 AM. Pt. States she needs yellow fever, and is going to [facility] to receive it this week. No other concerns at this time.

## 2018-03-16 MED FILL — ATOVAQUONE-PROGUANIL 250-10: 250-100 | 35 days supply | Qty: 35 | Fill #0

## 2018-03-16 MED FILL — AZITHROMYCIN 500 MG TABLET: 500 | 3 days supply | Qty: 3 | Fill #0

## 2018-03-23 ENCOUNTER — Ambulatory Visit (INDEPENDENT_AMBULATORY_CARE_PROVIDER_SITE_OTHER): Payer: No Typology Code available for payment source | Admitting: Family Medicine

## 2018-03-23 ENCOUNTER — Encounter: Payer: Self-pay | Admitting: Family Medicine

## 2018-03-23 ENCOUNTER — Other Ambulatory Visit (HOSPITAL_COMMUNITY)
Admission: RE | Admit: 2018-03-23 | Discharge: 2018-03-23 | Disposition: A | Discharge status: Home / Self Care | Payer: No Typology Code available for payment source | Source: Ambulatory Visit | Attending: Family Medicine | Admitting: Family Medicine

## 2018-03-23 VITALS — BP 112/65 | HR 71 | Temp 98.5°F | Resp 16 | Ht 65.0 in | Wt 278.2 lb

## 2018-03-23 DIAGNOSIS — J302 Other seasonal allergic rhinitis: Secondary | ICD-10-CM | POA: Diagnosis not present

## 2018-03-23 DIAGNOSIS — Z114 Encounter for screening for human immunodeficiency virus [HIV]: Secondary | ICD-10-CM

## 2018-03-23 DIAGNOSIS — Z124 Encounter for screening for malignant neoplasm of cervix: Secondary | ICD-10-CM

## 2018-03-23 DIAGNOSIS — J069 Acute upper respiratory infection, unspecified: Secondary | ICD-10-CM | POA: Diagnosis not present

## 2018-03-23 DIAGNOSIS — Z0000 Encounter for general adult medical examination without abnormal findings: Secondary | ICD-10-CM | POA: Insufficient documentation

## 2018-03-23 LAB — POC URINALSYSI DIPSTICK (AUTOMATED)
BILIRUBIN UA: NEGATIVE
GLUCOSE UA: NEGATIVE
Ketones, UA: NEGATIVE
LEUKOCYTES UA: NEGATIVE
NITRITE UA: NEGATIVE
Protein, UA: NEGATIVE
RBC UA: NEGATIVE
Spec Grav, UA: 1.015 (ref 1.010–1.025)
UROBILINOGEN UA: 0.2 U/dL
pH, UA: 7 (ref 5.0–8.0)

## 2018-03-23 LAB — COMPREHENSIVE METABOLIC PANEL
ALT: 13 U/L (ref 0–35)
AST: 16 U/L (ref 0–37)
Albumin: 4.2 g/dL (ref 3.5–5.2)
Alkaline Phosphatase: 50 U/L (ref 39–117)
BILIRUBIN TOTAL: 0.4 mg/dL (ref 0.2–1.2)
BUN: 11 mg/dL (ref 6–23)
CO2: 31 meq/L (ref 19–32)
CREATININE: 0.7 mg/dL (ref 0.40–1.20)
Calcium: 9.2 mg/dL (ref 8.4–10.5)
Chloride: 104 mEq/L (ref 96–112)
GFR: 115.71 mL/min (ref >60.00)
GLUCOSE: 111 mg/dL — AB (ref 70–99)
Potassium: 3.9 mEq/L (ref 3.5–5.1)
Sodium: 142 mEq/L (ref 135–145)
Total Protein: 6.9 g/dL (ref 6.0–8.3)

## 2018-03-23 LAB — LIPID PANEL
CHOLESTEROL: 192 mg/dL (ref 0–200)
HDL: 54.3 mg/dL (ref >39.00)
LDL Cholesterol: 123 mg/dL — ABNORMAL HIGH (ref 0–99)
NONHDL: 138.1
TRIGLYCERIDES: 75 mg/dL (ref 0.0–149.0)
Total CHOL/HDL Ratio: 4
VLDL: 15 mg/dL (ref 0.0–40.0)

## 2018-03-23 LAB — CBC WITH DIFFERENTIAL/PLATELET
BASOS ABS: 0.1 10*3/uL (ref 0.0–0.1)
Basophils Relative: 0.9 % (ref 0.0–3.0)
EOS ABS: 0.1 10*3/uL (ref 0.0–0.7)
Eosinophils Relative: 1.9 % (ref 0.0–5.0)
HCT: 39.7 % (ref 36.0–46.0)
Hemoglobin: 12.9 g/dL (ref 12.0–15.0)
LYMPHS ABS: 2.4 10*3/uL (ref 0.7–4.0)
Lymphocytes Relative: 38.9 % (ref 12.0–46.0)
MCHC: 32.3 g/dL (ref 30.0–36.0)
MCV: 85.5 fl (ref 78.0–100.0)
MONO ABS: 0.5 10*3/uL (ref 0.1–1.0)
MONOS PCT: 8.5 % (ref 3.0–12.0)
NEUTROS ABS: 3 10*3/uL (ref 1.4–7.7)
NEUTROS PCT: 49.8 % (ref 43.0–77.0)
PLATELETS: 208 10*3/uL (ref 150.0–400.0)
RBC: 4.65 Mil/uL (ref 3.87–5.11)
RDW: 16 % — ABNORMAL HIGH (ref 11.5–15.5)
WBC: 6.1 10*3/uL (ref 4.0–10.5)

## 2018-03-23 LAB — TSH: TSH: 0.67 u[IU]/mL (ref 0.35–4.50)

## 2018-03-23 MED ORDER — AZELASTINE HCL 0.1 % NA SOLN: 1.0000 | Freq: Two times a day (BID) | NASAL | 12 refills | Status: DC

## 2018-03-23 MED ORDER — LEVOCETIRIZINE DIHYDROCHLORIDE 5 MG PO TABS: 5.0000 mg | ORAL_TABLET | Freq: Every evening | ORAL | 5 refills | Status: DC

## 2018-03-23 MED ORDER — FLUTICASONE PROPIONATE 50 MCG/ACT NA SUSP: 2.0000 | Freq: Every day | NASAL | 6 refills | Status: DC

## 2018-03-23 MED ORDER — MONTELUKAST SODIUM 10 MG PO TABS: 10.0000 mg | ORAL_TABLET | Freq: Every day | ORAL | 3 refills | Status: DC

## 2018-03-23 MED FILL — MONTELUKAST SOD 10 MG TAB: 10 | 30 days supply | Qty: 30 | Fill #0

## 2018-03-23 MED FILL — AZELASTINE HCL 137 MCG/SPRA: 137 | 50 days supply | Qty: 30 | Fill #0

## 2018-03-23 MED FILL — FLUTICASONE PROP 50 MCG SPR: 50 | 30 days supply | Qty: 16 | Fill #0

## 2018-03-23 MED FILL — LEVOCETIRIZINE 5 MG TABLET: 5 | 30 days supply | Qty: 30 | Fill #0

## 2018-03-23 NOTE — Progress Notes (Signed)
Subjective:     Stephanie Mcconnell is a 47 y.o. female and is here for a comprehensive physical exam. The patient reports no problems.  Social History   Socioeconomic History  . Marital status: Married    Spouse name: Not on file  . Number of children: 1  . Years of education: Not on file  . Highest education level: Not on file  Occupational History  . 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 Financial resource strain: Not on file  . Food insecurity:    Worry: Not on file    Inability: Not on file  . Transportation needs:    Medical: Not on file    Non-medical: Not on file  Tobacco Use  . Smoking status: Never Smoker  . Smokeless tobacco: Never Used  Substance and Sexual Activity  . Alcohol use: Yes    Alcohol/week: 0.6 oz    Types: 1 Glasses of wine per week    Comment: rarely  . Drug use: No  . Sexual activity: Yes    Partners: Male  Lifestyle  . Physical activity:    Days per week: Not on file    Minutes per session: Not on file  . Stress: Not on file  Relationships  . Social connections:    Talks on phone: Not on file    Gets together: Not on file    Attends religious service: Not on file    Active member of club or organization: Not on file    Attends meetings of clubs or organizations: Not on file    Relationship status: Not on file  . Intimate partner violence:    Fear of current or ex partner: Not on file    Emotionally abused: Not on file    Physically abused: Not on file    Forced sexual activity: Not on file  Other Topics Concern  . Not on file  Social History Narrative   From [place], [place]   Lives with her husband and their daughter.   Exercise-walking   Health Maintenance  Topic Date Due  . INFLUENZA VACCINE  05/03/2018  . TETANUS/TDAP  10/03/2018  . MAMMOGRAM  03/03/2019  . PAP SMEAR  03/16/2020  . HIV Screening  Completed    The following portions of the patient's history were reviewed and updated as  appropriate:  She  has a past medical history of Allergy, Bulging disc, and Tuberculosis. She does not have any pertinent problems on file. She  has a past surgical history that includes Ventral hernia repair (03/12/2012); Mass excision (03/12/2012); Hernia repair; and Endoscopic vein laser treatment. Her family history is not on file. She  reports that she has never smoked. She has never used smokeless tobacco. She reports that she drinks about 0.6 oz of alcohol per week. She reports that she does not use drugs. She has a current medication list which includes the following prescription(s): azelastine, fluticasone, levocetirizine, montelukast, multivitamin, and tranexamic acid. Current Outpatient Medications on File Prior to Visit  Medication Sig Dispense Refill  . azelastine (ASTELIN) 0.1 % nasal spray Place 1 spray into both nostrils 2 (two) times daily. Use in each nostril as directed 30 mL 12  . fluticasone (FLONASE) 50 MCG/ACT nasal spray Place 2 sprays into both nostrils daily. 16 g 6  . levocetirizine (XYZAL) 5 MG tablet TAKE 1 TABLET (5 MG TOTAL) BY MOUTH EVERY EVENING. 30 tablet 5  . montelukast (SINGULAIR) 10 MG  tablet Take 1 tablet (10 mg total) by mouth at bedtime. 30 tablet 3  . Multiple Vitamin (MULTIVITAMIN) capsule Take 1 capsule by mouth daily.    . tranexamic acid (LYSTEDA) 650 MG TABS tablet Take 1,300 mg by mouth 3 (three) times daily.     No current facility-administered medications on file prior to visit.    She has No Known Allergies..  Review of Systems Review of Systems  Constitutional: Negative for activity change, appetite change and fatigue.  HENT: Negative for hearing loss, congestion, tinnitus and ear discharge.  dentist q23m Eyes: Negative for visual disturbance (see optho q1y -- vision corrected to 20/20 with glasses).  Respiratory: Negative for cough, chest tightness and shortness of breath.   Cardiovascular: Negative for chest pain, palpitations and leg  swelling.  Gastrointestinal: Negative for abdominal pain, diarrhea, constipation and abdominal distention.  Genitourinary: Negative for urgency, frequency, decreased urine volume and difficulty urinating.  Musculoskeletal: Negative for back pain, arthralgias and gait problem.  Skin: Negative for color change, pallor and rash.  Neurological: Negative for dizziness, light-headedness, numbness and headaches.  Hematological: Negative for adenopathy. Does not bruise/bleed easily.  Psychiatric/Behavioral: Negative for suicidal ideas, confusion, sleep disturbance, self-injury, dysphoric mood, decreased concentration and agitation.       Objective:    BP 112/65 (BP Location: Left Arm, Cuff Size: Large)   Pulse 71   Temp 98.5 F (36.9 C) (Oral)   Resp 16   Ht 5\' 5"  (1.651 m)   Wt 278 lb 3.2 oz (126.2 kg)   LMP 02/20/2018   SpO2 100%   BMI 46.29 kg/m  General appearance: alert, cooperative, appears stated age and no distress Head: Normocephalic, without obvious abnormality, atraumatic Eyes: conjunctivae/corneas clear. PERRL, EOM's intact. Fundi benign. Ears: normal TM's and external ear canals both ears Nose: Nares normal. Septum midline. Mucosa normal. No drainage or sinus tenderness. Throat: lips, mucosa, and tongue normal; teeth and gums normal Neck: no adenopathy, no carotid bruit, no JVD, supple, symmetrical, trachea midline and thyroid not enlarged, symmetric, no tenderness/mass/nodules Back: symmetric, no curvature. ROM normal. No CVA tenderness. Lungs: clear to auscultation bilaterally Breasts: normal appearance, no masses or tenderness Heart: regular rate and rhythm, S1, S2 normal, no murmur, click, rub or gallop Abdomen: soft, non-tender; bowel sounds normal; no masses,  no organomegaly Pelvic: cervix normal in appearance, external genitalia normal, no adnexal masses or tenderness, no cervical motion tenderness, rectovaginal septum normal, uterus normal size, shape, and  consistency, vagina normal without discharge and pap done,  rectal-- heme neg brown stool Extremities: extremities normal, atraumatic, no cyanosis or edema Pulses: 2+ and symmetric Skin: Skin color, texture, turgor normal. No rashes or lesions Lymph nodes: Cervical, supraclavicular, and axillary nodes normal. Neurologic: Alert and oriented X 3, normal strength and tone. Normal symmetric reflexes. Normal coordination and gait    Assessment:    Healthy female exam.      Plan:     ghm utd Check labs See After Visit Summary for Counseling Recommendations    1. Cervical cancer screening  - Cytology - PAP  2. Preventative health care See above - Comprehensive metabolic panel - CBC with Differential/Platelet - Lipid panel - TSH - POCT Urinalysis Dipstick (Automated)  3. Seasonal allergies   - azelastine (ASTELIN) 0.1 % nasal spray; Place 1 spray into both nostrils 2 (two) times daily. Use in each nostril as directed  Dispense: 30 mL; Refill: 12 - montelukast (SINGULAIR) 10 MG tablet; Take 1 tablet (10 mg total) by  mouth at bedtime.  Dispense: 30 tablet; Refill: 3  4. Acute upper respiratory infection   - fluticasone (FLONASE) 50 MCG/ACT nasal spray; Place 2 sprays into both nostrils daily.  Dispense: 16 g; Refill: 6 - levocetirizine (XYZAL) 5 MG tablet; Take 1 tablet (5 mg total) by mouth every evening.  Dispense: 30 tablet; Refill: 5  5. Encounter for screening for HIV   - HIV antibody

## 2018-03-23 NOTE — Patient Instructions (Signed)
Preventive Care 40-64 Years, Female Preventive care refers to lifestyle choices and visits with your health care provider that can promote health and wellness. What does preventive care include?  A yearly physical exam. This is also called an annual well check.  Dental exams once or twice a year.  Routine eye exams. Ask your health care provider how often you should have your eyes checked.  Personal lifestyle choices, including: ? Daily care of your teeth and gums. ? Regular physical activity. ? Eating a healthy diet. ? Avoiding tobacco and drug use. ? Limiting alcohol use. ? Practicing safe sex. ? Taking low-dose aspirin daily starting at age 46. ? Taking vitamin and mineral supplements as recommended by your health care provider. What happens during an annual well check? The services and screenings done by your health care provider during your annual well check will depend on your age, overall health, lifestyle risk factors, and family history of disease. Counseling Your health care provider may ask you questions about your:  Alcohol use.  Tobacco use.  Drug use.  Emotional well-being.  Home and relationship well-being.  Sexual activity.  Eating habits.  Work and work Statistician.  Method of birth control.  Menstrual cycle.  Pregnancy history.  Screening You may have the following tests or measurements:  Height, weight, and BMI.  Blood pressure.  Lipid and cholesterol levels. These may be checked every 5 years, or more frequently if you are over 46 years old.  Skin check.  Lung cancer screening. You may have this screening every year starting at age 46 if you have a 30-pack-year history of smoking and currently smoke or have quit within the past 15 years.  Fecal occult blood test (FOBT) of the stool. You may have this test every year starting at age 46.  Flexible sigmoidoscopy or colonoscopy. You may have a sigmoidoscopy every 5 years or a colonoscopy  every 10 years starting at age 30.  Hepatitis C blood test.  Hepatitis B blood test.  Sexually transmitted disease (STD) testing.  Diabetes screening. This is done by checking your blood sugar (glucose) after you have not eaten for a while (fasting). You may have this done every 1-3 years.  Mammogram. This may be done every 1-2 years. Talk to your health care provider about when you should start having regular mammograms. This may depend on whether you have a family history of breast cancer.  BRCA-related cancer screening. This may be done if you have a family history of breast, ovarian, tubal, or peritoneal cancers.  Pelvic exam and Pap test. This may be done every 3 years starting at age 46. Starting at age 36, this may be done every 5 years if you have a Pap test in combination with an HPV test.  Bone density scan. This is done to screen for osteoporosis. You may have this scan if you are at high risk for osteoporosis.  Discuss your test results, treatment options, and if necessary, the need for more tests with your health care provider. Vaccines Your health care provider may recommend certain vaccines, such as:  Influenza vaccine. This is recommended every year.  Tetanus, diphtheria, and acellular pertussis (Tdap, Td) vaccine. You may need a Td booster every 10 years.  Varicella vaccine. You may need this if you have not been vaccinated.  Zoster vaccine. You may need this after age 5.  Measles, mumps, and rubella (MMR) vaccine. You may need at least one dose of MMR if you were born in  1957 or later. You may also need a second dose.  Pneumococcal 13-valent conjugate (PCV13) vaccine. You may need this if you have certain conditions and were not previously vaccinated.  Pneumococcal polysaccharide (PPSV23) vaccine. You may need one or two doses if you smoke cigarettes or if you have certain conditions.  Meningococcal vaccine. You may need this if you have certain  conditions.  Hepatitis A vaccine. You may need this if you have certain conditions or if you travel or work in places where you may be exposed to hepatitis A.  Hepatitis B vaccine. You may need this if you have certain conditions or if you travel or work in places where you may be exposed to hepatitis B.  Haemophilus influenzae type b (Hib) vaccine. You may need this if you have certain conditions.  Talk to your health care provider about which screenings and vaccines you need and how often you need them. This information is not intended to replace advice given to you by your health care provider. Make sure you discuss any questions you have with your health care provider. Document Released: 10/16/2015 Document Revised: 06/08/2016 Document Reviewed: 07/21/2015 Elsevier Interactive Patient Education  2018 [employer]  

## 2018-03-24 LAB — HIV ANTIBODY (ROUTINE TESTING W REFLEX): HIV: NONREACTIVE

## 2018-03-27 LAB — CYTOLOGY - PAP
Bacterial vaginitis: NEGATIVE
CANDIDA VAGINITIS: NEGATIVE
Chlamydia: NEGATIVE
Diagnosis: NEGATIVE
HPV: NOT DETECTED
Neisseria Gonorrhea: NEGATIVE
TRICH (WINDOWPATH): NEGATIVE

## 2018-05-18 ENCOUNTER — Encounter: Payer: Self-pay | Admitting: Family Medicine

## 2018-05-18 ENCOUNTER — Ambulatory Visit (INDEPENDENT_AMBULATORY_CARE_PROVIDER_SITE_OTHER): Payer: No Typology Code available for payment source | Admitting: Family Medicine

## 2018-05-18 VITALS — BP 118/78 | HR 91 | Temp 98.3°F | Ht 65.0 in | Wt 284.0 lb

## 2018-05-18 DIAGNOSIS — S46811A Strain of other muscles, fascia and tendons at shoulder and upper arm level, right arm, initial encounter: Secondary | ICD-10-CM | POA: Diagnosis not present

## 2018-05-18 DIAGNOSIS — N939 Abnormal uterine and vaginal bleeding, unspecified: Secondary | ICD-10-CM | POA: Diagnosis not present

## 2018-05-18 LAB — CBC
HCT: 36.3 % (ref 36.0–46.0)
HEMOGLOBIN: 11.8 g/dL — AB (ref 12.0–15.0)
MCHC: 32.5 g/dL (ref 30.0–36.0)
MCV: 83.3 fl (ref 78.0–100.0)
PLATELETS: 261 10*3/uL (ref 150.0–400.0)
RBC: 4.36 Mil/uL (ref 3.87–5.11)
RDW: 15.7 % — ABNORMAL HIGH (ref 11.5–15.5)
WBC: 5.4 10*3/uL (ref 4.0–10.5)

## 2018-05-18 MED ORDER — MEDROXYPROGESTERONE ACETATE 10 MG PO TABS: 10.0000 mg | ORAL_TABLET | Freq: Every day | ORAL | 0 refills | Status: DC

## 2018-05-18 MED ORDER — CYCLOBENZAPRINE HCL 10 MG PO TABS: 10.0000 mg | ORAL_TABLET | Freq: Three times a day (TID) | ORAL | 0 refills | Status: DC | PRN

## 2018-05-18 MED FILL — CYCLOBENZAPRINE HCL 10 MG T: 10 | 7 days supply | Qty: 20 | Fill #0

## 2018-05-18 MED FILL — MEDROXYPROGESTERONE 10 MG T: 10 | 7 days supply | Qty: 7 | Fill #0

## 2018-05-18 NOTE — Patient Instructions (Addendum)
Heat (pad or rice pillow in microwave) over affected area, 10-15 minutes twice daily.   Give [place] 2-3 business days to get the results of your labs back.   OK to take Tylenol 1000 mg (2 extra strength tabs) or 975 mg (3 regular strength tabs) every 6 hours as needed.  Ibuprofen 400-600 mg (2-3 over the counter strength tabs) every 6 hours as needed for pain.  Trapezius stretches/exercises Do exercises exactly as told by your health care provider and adjust them as directed. It is normal to feel mild stretching, pulling, tightness, or discomfort as you do these exercises, but you should stop right away if you feel sudden pain or your pain gets worse.  Stretching and range of motion exercises These exercises warm up your muscles and joints and improve the movement and flexibility of your shoulder. These exercises can also help to relieve pain, numbness, and tingling. If you are unable to do any of the following for any reason, do not further attempt to do it.   Exercise A: Flexion, standing    1. Stand and hold a broomstick, a cane, or a similar object. Place your hands a little more than shoulder-width apart on the object. Your left / right hand should be palm-up, and your other hand should be palm-down. 2. Push the stick to raise your left / right arm out to your side and then over your head. Use your other hand to help move the stick. Stop when you feel a stretch in your shoulder, or when you reach the angle that is recommended by your health care provider. ? Avoid shrugging your shoulder while you raise your arm. Keep your shoulder blade tucked down toward your spine. 3. Hold for 30 seconds. 4. Slowly return to the starting position. Repeat 2 times. Complete this exercise 3 times per week.  Exercise B: Abduction, supine    1. Lie on your back and hold a broomstick, a cane, or a similar object. Place your hands a little more than shoulder-width apart on the object. Your left / right hand  should be palm-up, and your other hand should be palm-down. 2. Push the stick to raise your left / right arm out to your side and then over your head. Use your other hand to help move the stick. Stop when you feel a stretch in your shoulder, or when you reach the angle that is recommended by your health care provider. ? Avoid shrugging your shoulder while you raise your arm. Keep your shoulder blade tucked down toward your spine. 3. Hold for 30 seconds. 4. Slowly return to the starting position. Repeat 2 times. Complete this exercise 3 times per week.  Exercise C: Flexion, active-assisted    1. Lie on your back. You may bend your knees for comfort. 2. Hold a broomstick, a cane, or a similar object. Place your hands about shoulder-width apart on the object. Your palms should face toward your feet. 3. Raise the stick and move your arms over your head and behind your head, toward the floor. Use your healthy arm to help your left / right arm move farther. Stop when you feel a gentle stretch in your shoulder, or when you reach the angle where your health care provider tells you to stop. 4. Hold for 30 seconds. 5. Slowly return to the starting position. Repeat 2 times. Complete this exercise 3 times per week.  Exercise D: External rotation and abduction    1. Stand in a door frame  with one of your feet slightly in front of the other. This is called a staggered stance. 2. Choose one of the following positions as told by your health care provider: ? Place your hands and forearms on the door frame above your head. ? Place your hands and forearms on the door frame at the height of your head. ? Place your hands on the door frame at the height of your elbows. 3. Slowly move your weight onto your front foot until you feel a stretch across your chest and in the front of your shoulders. Keep your head and chest upright and keep your abdominal muscles tight. 4. Hold for 30 seconds. 5. To release the  stretch, shift your weight to your back foot. Repeat 2 times. Complete this stretch 3 times per week.  Strengthening exercises These exercises build strength and endurance in your shoulder. Endurance is the ability to use your muscles for a long time, even after your muscles get tired. Exercise E: Scapular depression and adduction  1. Sit on a stable chair. Support your arms in front of you with pillows, armrests, or a tabletop. Keep your elbows in line with the sides of your body. 2. Gently move your shoulder blades down toward your middle back. Relax the muscles on the tops of your shoulders and in the back of your neck. 3. Hold for 3 seconds. 4. Slowly release the tension and relax your muscles completely before doing this exercise again. Repeat for a total of 10 repetitions. 5. After you have practiced this exercise, try doing the exercise without the arm support. Then, try the exercise while standing instead of sitting. Repeat 2 times. Complete this exercise 3 times per week.  Exercise F: Shoulder abduction, isometric    1. Stand or sit about 4-6 inches (10-15 cm) from a wall with your left / right side facing the wall. 2. Bend your left / right elbow and gently press your elbow against the wall. 3. Increase the pressure slowly until you are pressing as hard as you can without shrugging your shoulder. 4. Hold for 3 seconds. 5. Slowly release the tension and relax your muscles completely. Repeat for a total of 10 repetitions. Repeat 2 times. Complete this exercise 3 times per week.  Exercise G: Shoulder flexion, isometric    1. Stand or sit about 4-6 inches (10-15 cm) away from a wall with your left / right side facing the wall. 2. Keep your left / right elbow straight and gently press the top of your fist against the wall. Increase the pressure slowly until you are pressing as hard as you can without shrugging your shoulder. 3. Hold for 10-15 seconds. 4. Slowly release the tension  and relax your muscles completely. Repeat for a total of 10 repetitions. Repeat 2 times. Complete this exercise 3 times per week.  Exercise H: Internal rotation    1. Sit in a stable chair without armrests, or stand. Secure an exercise band at your left / right side, at elbow height. 2. Place a soft object, such as a folded towel or a small pillow, under your left / right upper arm so your elbow is a few inches (about 8 cm) away from your side. 3. Hold the end of the exercise band so the band stretches. 4. Keeping your elbow pressed against the soft object under your arm, move your forearm across your body toward your abdomen. Keep your body steady so the movement is only coming from your shoulder.  5. Hold for 3 seconds. 6. Slowly return to the starting position. Repeat for a total of 10 repetitions. Repeat 2 times. Complete this exercise 3 times per week.  Exercise I: External rotation    1. Sit in a stable chair without armrests, or stand. 2. Secure an exercise band at your left / right side, at elbow height. 3. Place a soft object, such as a folded towel or a small pillow, under your left / right upper arm so your elbow is a few inches (about 8 cm) away from your side. 4. Hold the end of the exercise band so the band stretches. 5. Keeping your elbow pressed against the soft object under your arm, move your forearm out, away from your abdomen. Keep your body steady so the movement is only coming from your shoulder. 6. Hold for 3 seconds. 7. Slowly return to the starting position. Repeat for a total of 10 repetitions. Repeat 2 times. Complete this exercise 3 times per week. Exercise J: Shoulder extension  1. Sit in a stable chair without armrests, or stand. Secure an exercise band to a stable object in front of you so the band is at shoulder height. 2. Hold one end of the exercise band in each hand. Your palms should face each other. 3. Straighten your elbows and lift your hands up to  shoulder height. 4. Step back, away from the secured end of the exercise band, until the band stretches. 5. Squeeze your shoulder blades together and pull your hands down to the sides of your thighs. Stop when your hands are straight down by your sides. Do not let your hands go behind your body. 6. Hold for 3 seconds. 7. Slowly return to the starting position. Repeat for a total of 10 repetitions. Repeat 2 times. Complete this exercise 3 times per week.  Exercise K: Shoulder extension, prone    1. Lie on your abdomen on a firm surface so your left / right arm hangs over the edge. 2. Hold a 5 lb weight in your hand so your palm faces in toward your body. Your arm should be straight. 3. Squeeze your shoulder blade down toward the middle of your back. 4. Slowly raise your arm behind you, up to the height of the surface that you are lying on. Keep your arm straight. 5. Hold for 3 seconds. 6. Slowly return to the starting position and relax your muscles. Repeat for a total of 10 repetitions. Repeat 2 times. Complete this exercise 3 times per week.   Exercise L: Horizontal abduction, prone  1. Lie on your abdomen on a firm surface so your left / right arm hangs over the edge. 2. Hold a 5 lb weight in your hand so your palm faces toward your feet. Your arm should be straight. 3. Squeeze your shoulder blade down toward the middle of your back. 4. Bend your elbow so your hand moves up, until your elbow is bent to an "L" shape (90 degrees). With your elbow bent, slowly move your forearm forward and up. Raise your hand up to the height of the surface that you are lying on. ? Your upper arm should not move, and your elbow should stay bent. ? At the top of the movement, your palm should face the floor. 5. Hold for 3 seconds. 6. Slowly return to the starting position and relax your muscles. Repeat for a total of 10 repetitions. Repeat 2 times. Complete this exercise 3 times per week.  Exercise  M:  Horizontal abduction, standing  1. Sit on a stable chair, or stand. 2. Secure an exercise band to a stable object in front of you so the band is at shoulder height. 3. Hold one end of the exercise band in each hand. 4. Straighten your elbows and lift your hands straight in front of you, up to shoulder height. Your palms should face down, toward the floor. 5. Step back, away from the secured end of the exercise band, until the band stretches. 6. Move your arms out to your sides, and keep your arms straight. 7. Hold for 3 seconds. 8. Slowly return to the starting position. Repeat for a total of 10 repetitions. Repeat 2 times. Complete this exercise 3 times per week.  Exercise N: Scapular retraction and elevation  1. Sit on a stable chair, or stand. 2. Secure an exercise band to a stable object in front of you so the band is at shoulder height. 3. Hold one end of the exercise band in each hand. Your palms should face each other. 4. Sit in a stable chair without armrests, or stand. 5. Step back, away from the secured end of the exercise band, until the band stretches. 6. Squeeze your shoulder blades together and lift your hands over your head. Keep your elbows straight. 7. Hold for 3 seconds. 8. Slowly return to the starting position. Repeat for a total of 10 repetitions. Repeat 2 times. Complete this exercise 3 times per week.  This information is not intended to replace advice given to you by your health care provider. Make sure you discuss any questions you have with your health care provider. Document Released: 09/19/2005 Document Revised: 05/26/2016 Document Reviewed: 08/06/2015 Elsevier Interactive Patient Education  2017 [employer].

## 2018-05-18 NOTE — Progress Notes (Signed)
Pre visit review using our clinic review tool, if applicable. No additional management support is needed unless otherwise documented below in the visit note. 

## 2018-05-18 NOTE — Progress Notes (Signed)
Chief Complaint  Patient presents with  . Menstrual Problem    bleeding since 05/02/2018    Subjective: Patient is a 47 y.o. female here for AUB.  Pt has been bleeding since 7-31. She is normally regular and will bleed for 5 d.  She missed her cycle in June.  She had the Essure procedure and took several pregnancy tests all resulting negative.  The patient has not been sexually active since that time.  She has been be leading for long periods of time in addition to heavy flow.  She will go through 1 pad every 2 hours.  She is seeing some clots.  No other areas of easy bruising or bleeding.  She denies any injury, pain, discharge, or fevers.  She is not on any hormonal medication.  She has not felt dizzy, weak, or lightheaded.  Over the past several days, she has been expensing right neck/shoulder pain.  She recently returned from a cruise where she woke up after sleeping wrong on it.  No numbness/tingling or weakness.  She has been using Aleve at home.   ROS: GU: As noted in HPI MSK: As noted in HPI  Past Medical History:  Diagnosis Date  . Allergy   . Bulging disc    lower back.   . Tuberculosis    Exposed to TB 2003 and recived tx but never dx'd    Objective: BP 118/78 (BP Location: Left Arm, Patient Position: Sitting, Cuff Size: Large)   Pulse 91   Temp 98.3 F (36.8 C) (Oral)   Ht 5\' 5"  (1.651 m)   Wt 284 lb (128.8 kg)   SpO2 97%   BMI 47.26 kg/m  General: Awake, appears stated age HEENT: MMM, EOMi Heart: RRR, no murmurs Lungs: CTAB, no rales, wheezes or rhonchi. No accessory muscle use Abd: BS+, soft, nontender, nondistended, no masses or organomegaly MSK: Tender to palpation over the right trapezius, slight decreased passive range of motion, no tenderness over the [facility] joint or deltoid, negative Neer's, Hawkins, speeds, liftoff Neuro: DTRs equal and symmetric in the upper extremities, no clonus, no cerebellar signs Psych: Age appropriate judgment and insight, normal  affect and mood  Assessment and Plan: Abnormal uterine bleeding (AUB) - Plan: medroxyPROGESTERone (PROVERA) 10 MG tablet, CBC  Strain of right trapezius muscle, initial encounter - Plan: cyclobenzaprine (FLEXERIL) 10 MG tablet  Check CBC, progesterone challenge.  If no improvement, would need to perform pelvic exam to visualize any possible areas of bleeding and/or seeing her gynecologist. Tylenol, antiemetics, stretches/exercises, ice, heat. Follow-up as needed. The patient voiced understanding and agreement to the plan.  [place], DO 05/18/18  11:11 AM

## 2018-05-28 ENCOUNTER — Encounter: Payer: Self-pay | Admitting: Family Medicine

## 2018-05-31 ENCOUNTER — Encounter (HOSPITAL_BASED_OUTPATIENT_CLINIC_OR_DEPARTMENT_OTHER): Payer: Self-pay

## 2018-05-31 ENCOUNTER — Other Ambulatory Visit (HOSPITAL_COMMUNITY)
Admission: RE | Admit: 2018-05-31 | Discharge: 2018-05-31 | Disposition: A | Discharge status: Home / Self Care | Payer: No Typology Code available for payment source | Source: Ambulatory Visit | Attending: Obstetrics & Gynecology | Admitting: Obstetrics & Gynecology

## 2018-05-31 ENCOUNTER — Ambulatory Visit (HOSPITAL_BASED_OUTPATIENT_CLINIC_OR_DEPARTMENT_OTHER)
Admission: RE | Admit: 2018-05-31 | Discharge: 2018-05-31 | Disposition: A | Discharge status: Home / Self Care | Payer: No Typology Code available for payment source | Source: Ambulatory Visit | Attending: Obstetrics & Gynecology | Admitting: Obstetrics & Gynecology

## 2018-05-31 ENCOUNTER — Encounter: Payer: Self-pay | Admitting: Family Medicine

## 2018-05-31 ENCOUNTER — Encounter: Payer: Self-pay | Admitting: Obstetrics & Gynecology

## 2018-05-31 ENCOUNTER — Ambulatory Visit (INDEPENDENT_AMBULATORY_CARE_PROVIDER_SITE_OTHER): Payer: No Typology Code available for payment source | Admitting: Family Medicine

## 2018-05-31 ENCOUNTER — Ambulatory Visit (INDEPENDENT_AMBULATORY_CARE_PROVIDER_SITE_OTHER): Payer: No Typology Code available for payment source | Admitting: Obstetrics & Gynecology

## 2018-05-31 VITALS — BP 115/82 | HR 77 | Wt 278.0 lb

## 2018-05-31 VITALS — BP 115/82 | HR 77 | Temp 98.2°F | Resp 16 | Wt 278.0 lb

## 2018-05-31 DIAGNOSIS — N939 Abnormal uterine and vaginal bleeding, unspecified: Secondary | ICD-10-CM | POA: Insufficient documentation

## 2018-05-31 DIAGNOSIS — D219 Benign neoplasm of connective and other soft tissue, unspecified: Secondary | ICD-10-CM | POA: Diagnosis not present

## 2018-05-31 LAB — POCT URINE PREGNANCY: Preg Test, Ur: NEGATIVE

## 2018-05-31 MED ORDER — MEGESTROL ACETATE 40 MG PO TABS: 40.0000 mg | ORAL_TABLET | Freq: Two times a day (BID) | ORAL | 5 refills | Status: DC

## 2018-05-31 MED ORDER — MEGESTROL ACETATE 40 MG PO TABS: 40.0000 mg | ORAL_TABLET | Freq: Two times a day (BID) | ORAL | 3 refills | Status: DC

## 2018-05-31 MED FILL — MEGESTROL 40 MG TABLET: 40 | 15 days supply | Qty: 60 | Fill #0

## 2018-05-31 NOTE — Progress Notes (Signed)
Subjective:     Stephanie Mcconnell is a 47 y.o. female here for a routine exam. G2P2 Current complaints: Pt reports bleeding since July 30th.  She had a normal period earlier in July as well.  Bleeding is very heavy and she wears pad and a tampon at the same time. She can feel thel clots coming out. No h/o STI. Pt married.  Pt denies pain with the bleeding. Pt works at [employer]. Pt mom was in her 69's when she went through menopause. Pts mom and pt were pregnant at the same time 27 years ago!   Gynecologic History Patient's last menstrual period was 05/01/2018. Contraception: Essure- 2015 Last Pap: 03/23/2018. Results were: normal Last mammogram: 03/02/2018. Results were: normal  Obstetric History OB History  Gravida Para Term Preterm AB Living  2 2 2     2   SAB TAB Ectopic Multiple Live Births               # Outcome Date GA Lbr Len/2nd Weight Sex Delivery Anes PTL Lv  2 Term           1 Term              The following portions of the patient's history were reviewed and updated as appropriate: allergies, current medications, past family history, past medical history, past social history, past surgical history and problem list.  Review of Systems Pertinent items are noted in HPI.    Objective:  BP 115/82   Pulse 77   Wt 278 lb (126.1 kg)   LMP 05/01/2018   BMI 46.26 kg/m    CONSTITUTIONAL: Well-developed, well-nourished female in no acute distress.  HENT:  Normocephalic, atraumatic EYES: Conjunctivae and EOM are normal. No scleral icterus.  NECK: Normal range of motion SKIN: Skin is warm and dry. No rash noted. Not diaphoretic.No pallor. [place]: Alert and oriented to person, place, and time. Normal coordination.  Lungs: CTA CV: RRR Abd: soft, NT, ND obese.    GU: WNL EGBUS: no lesions Vagina: large amount of blood in vault.  Cervix: no lesion; no mucopurulent d/c Uterus: enlarged; mobile. Fibroids palpable. Exam limited by pts body habitus.   Adnexa: no masses; non tender    The indications for endometrial biopsy were reviewed.   Risks of the biopsy including cramping, bleeding, infection, uterine perforation, inadequate specimen and need for additional procedures  were discussed. The patient states she understands and agrees to undergo procedure today. Consent was signed. Time out was performed. Urine HCG was negative. A sterile speculum was placed in the patient's vagina and the cervix was prepped with Betadine. A single-toothed tenaculum was placed on the anterior lip of the cervix to stabilize it. The 3 mm pipelle was introduced into the endometrial cavity without difficulty to a depth of 10cm, and a moderate amount of tissue was obtained and sent to pathology. The instruments were removed from the patient's vagina. Minimal bleeding from the cervix was noted. The patient tolerated the procedure well.   Assessment:   AUB- I suspect this is due to uterine fibroids.   Endo bx done today. Pt has had a normal PAP recently.     Plan:   F/u surg path- most of the specimen appeared to be blood. If no tissue pt may need repeat.   F/u Pelvic US Megace 80mg  bid x 1 week and then 40mg  bid  F/u in 4  weeks or sooner prn  Routine post-procedure instructions were given to the patient.  The patient will follow up to review the results and for further management.    Stephanie Mcconnell, M.D., Stephanie Mcconnell

## 2018-05-31 NOTE — Progress Notes (Signed)
Pt not seen--- gyn saw her instead

## 2018-05-31 NOTE — Progress Notes (Signed)
Pt hasn't stopped bleeding since 7/30 Last pap 03/23/18 - normal  Last mammogram 03/01/18- normal

## 2018-05-31 NOTE — Patient Instructions (Signed)
Endometrial Biopsy Endometrial biopsy is a procedure in which a tissue sample is taken from inside the uterus. The sample is taken from the endometrium, which is the lining of the uterus. The tissue sample is then checked under a microscope to see if the tissue is normal or abnormal. This procedure helps to determine where you are in your menstrual cycle and how hormone levels are affecting the lining of the uterus. This procedure may also be used to evaluate uterine bleeding or to diagnose endometrial cancer, endometrial tuberculosis, polyps, or other inflammatory conditions. Tell a health care provider about:  Any allergies you have.  All medicines you are taking, including vitamins, herbs, eye drops, creams, and over-the-counter medicines.  Any problems you or family members have had with anesthetic medicines.  Any blood disorders you have.  Any surgeries you have had.  Any medical conditions you have.  Whether you are pregnant or may be pregnant. What are the risks? Generally, this is a safe procedure. However, problems may occur, including:  Bleeding.  Pelvic infection.  Puncture of the wall of the uterus with the biopsy device (rare).  What happens before the procedure?  Keep a record of your menstrual cycles as told by your health care provider. You may need to schedule your procedure for a specific time in your cycle.  You may want to bring a sanitary pad to wear after the procedure.  Ask your health care provider about: ? Changing or stopping your regular medicines. This is especially important if you are taking diabetes medicines or blood thinners. ? Taking medicines such as aspirin and ibuprofen. These medicines can thin your blood. Do not take these medicines before your procedure if your health care provider instructs you not to.  Plan to have someone take you home from the hospital or clinic. What happens during the procedure?  To lower your risk of  infection: ? Your health care team will wash or sanitize their hands.  You will lie on an exam table with your feet and legs supported as in a pelvic exam.  Your health care provider will insert an instrument (speculum) into your vagina to see your cervix.  Your cervix will be cleansed with an antiseptic solution.  A medicine (local anesthetic) will be used to numb the cervix.  A forceps instrument (tenaculum) will be used to hold your cervix steady for the biopsy.  A thin, rod-like instrument (uterine sound) will be inserted through your cervix to determine the length of your uterus and the location where the biopsy sample will be removed.  A thin, flexible tube (catheter) will be inserted through your cervix and into the uterus. The catheter will be used to collect the biopsy sample from your endometrial tissue.  The catheter and speculum will then be removed, and the tissue sample will be sent to a lab for examination. What happens after the procedure?  You will rest in a recovery area until you are ready to go home.  You may have mild cramping and a small amount of vaginal bleeding. This is normal.  It is up to you to get the results of your procedure. Ask your health care provider, or the department that is doing the procedure, when your results will be ready. Summary  Endometrial biopsy is a procedure in which a tissue sample is taken from the endometrium, which is the lining of the uterus.  This procedure may help to diagnose menstrual cycle problems, abnormal bleeding, or other conditions affecting   the endometrium.  Before the procedure, keep a record of your menstrual cycles as told by your health care provider.  The tissue sample that is removed will be checked under a microscope to see if it is normal or abnormal. This information is not intended to replace advice given to you by your health care provider. Make sure you discuss any questions you have with your health care  provider. Document Released: 01/20/2005 Document Revised: 10/05/2016 Document Reviewed: 10/05/2016 Elsevier Interactive Patient Education  2017 [employer] Endometrial Biopsy, Care After This sheet gives you information about how to care for yourself after your procedure. Your health care provider may also give you more specific instructions. If you have problems or questions, contact your health care provider. What can I expect after the procedure? After the procedure, it is common to have:  Mild cramping.  A small amount of vaginal bleeding for a few days. This is normal.  Follow these instructions at home:  Take over-the-counter and prescription medicines only as told by your health care provider.  Do not douche, use tampons, or have sexual intercourse until your health care provider approves.  Return to your normal activities as told by your health care provider. Ask your health care provider what activities are safe for you.  Follow instructions from your health care provider about any activity restrictions, such as restrictions on strenuous exercise or heavy lifting. Contact a health care provider if:  You have heavy bleeding, or bleed for longer than 2 days after the procedure.  You have bad smelling discharge from your vagina.  You have a fever or chills.  You have a burning sensation when urinating or you have difficulty urinating.  You have severe pain in your lower abdomen. Get help right away if:  You have severe cramps in your stomach or back.  You pass large blood clots.  Your bleeding increases.  You become weak or light-headed, or you pass out. Summary  After the procedure, it is common to have mild cramping and a small amount of vaginal bleeding for a few days.  Do not douche, use tampons, or have sexual intercourse until your health care provider approves.  Return to your normal activities as told by your health care provider. Ask your health care  provider what activities are safe for you. This information is not intended to replace advice given to you by your health care provider. Make sure you discuss any questions you have with your health care provider. Document Released: 07/10/2013 Document Revised: 10/05/2016 Document Reviewed: 10/05/2016 Elsevier Interactive Patient Education  2017 [employer]  

## 2018-06-11 ENCOUNTER — Telehealth: Payer: Self-pay

## 2018-06-11 NOTE — Telephone Encounter (Signed)
Patient called and made aware that her endometrial biopsy was negative. Stephanie Alu RN

## 2018-06-11 NOTE — Telephone Encounter (Signed)
-----   Message from Lavonia Drafts, MD sent at 06/10/2018  4:58 PM EDT ----- Please call patient. Her endometrial biopsy results were negative.  Thx, clh-S

## 2018-06-13 ENCOUNTER — Ambulatory Visit: Payer: No Typology Code available for payment source | Admitting: Family Medicine

## 2018-06-28 ENCOUNTER — Ambulatory Visit (INDEPENDENT_AMBULATORY_CARE_PROVIDER_SITE_OTHER): Payer: No Typology Code available for payment source | Admitting: Family Medicine

## 2018-06-28 ENCOUNTER — Encounter: Payer: Self-pay | Admitting: Family Medicine

## 2018-06-28 VITALS — BP 132/83 | HR 87 | Temp 98.3°F | Ht 65.0 in | Wt 276.0 lb

## 2018-06-28 DIAGNOSIS — R5383 Other fatigue: Secondary | ICD-10-CM

## 2018-06-28 DIAGNOSIS — J029 Acute pharyngitis, unspecified: Secondary | ICD-10-CM | POA: Diagnosis not present

## 2018-06-28 DIAGNOSIS — J3089 Other allergic rhinitis: Secondary | ICD-10-CM | POA: Diagnosis not present

## 2018-06-28 LAB — POCT RAPID STREP A (OFFICE): Rapid Strep A Screen: NEGATIVE

## 2018-06-28 LAB — POCT INFLUENZA A/B
INFLUENZA A, POC: NEGATIVE
INFLUENZA B, POC: NEGATIVE

## 2018-06-28 MED ORDER — METHYLPREDNISOLONE ACETATE 80 MG/ML IJ SUSP
80.0000 mg | Freq: Once | INTRAMUSCULAR | Status: AC
  Administered 2018-06-28: 80 mg via INTRAMUSCULAR

## 2018-06-28 MED FILL — AZELASTINE HCL 137 MCG/SPRA: 137 | 50 days supply | Qty: 30 | Fill #1

## 2018-06-28 MED FILL — FLUTICASONE PROP 50 MCG SPR: 50 | 30 days supply | Qty: 16 | Fill #1

## 2018-06-28 MED FILL — MEGESTROL 40 MG TABLET: 40 | 15 days supply | Qty: 60 | Fill #1

## 2018-06-28 MED FILL — LEVOCETIRIZINE 5 MG TABLET: 5 | 30 days supply | Qty: 30 | Fill #1

## 2018-06-28 MED FILL — MONTELUKAST SOD 10 MG TAB: 10 | 30 days supply | Qty: 30 | Fill #1

## 2018-06-28 NOTE — Patient Instructions (Addendum)
Go back on Xyzal in addition to Flonase and Singulair.  Continue to push fluids, practice good hand hygiene, and cover your mouth if you cough.  If you start having fevers, shaking or shortness of breath, seek immediate care.  If you do not hear anything about your referral in the next 1-2 weeks, call our office and ask for an update.  Let us know if you need anything.

## 2018-06-28 NOTE — Progress Notes (Signed)
Chief Complaint  Patient presents with  . Sore Throat    c/o sore throat, sneezing, fatigue, both ears are sore, body aches.     Stephanie Mcconnell here for URI complaints.  Duration: 2 days  Associated symptoms: sinus congestion, rhinorrhea, itchy watery eyes, ear pain, sore throat, wheezing and a little coughing Denies: ear drainage, shortness of breath, myalgia and fevers Treatment to date: hot tea, has been using INCS and Singulair, OTC cold meds Sick contacts: No; does with in ED  ROS:  Const: Denies fevers HEENT: As noted in HPI Lungs: No SOB  Past Medical History:  Diagnosis Date  . Allergy   . Bulging disc    lower back.   . Tuberculosis    Exposed to TB 2003 and recived tx but never dx'd    BP 132/83 (BP Location: Right Arm, Patient Position: Sitting, Cuff Size: Large)   Pulse 87   Temp 98.3 F (36.8 C) (Oral)   Ht 5\' 5"  (1.651 m)   Wt 276 lb (125.2 kg)   SpO2 97%   BMI 45.93 kg/m  General: Awake, alert, appears stated age HEENT: AT, [place], ears patent b/l and TM's neg, nares patent w/o discharge, turbs boggy, slightly pale b/l;  pharynx pink and without exudates, MMM Neck: No masses or asymmetry Heart: RRR Lungs: CTAB, no accessory muscle use Psych: Age appropriate judgment and insight, normal mood and affect  Seasonal allergic rhinitis due to other allergic trigger - Plan: Ambulatory referral to Allergy, methylPREDNISolone acetate (DEPO-MEDROL) injection 80 mg  Sore throat - Plan: POCT rapid strep A  Other fatigue - Plan: POCT Influenza A/B  Orders as above. Testing neg.  Start back on Xyzal in add'n to INCS and Singulair.  Continue to push fluids, practice good hand hygiene, cover mouth when coughing. F/u prn. If starting to experience fevers, shaking, or shortness of breath, seek immediate care. Pt voiced understanding and agreement to the plan.  [place], DO 06/28/18 8:45 AM

## 2018-07-02 ENCOUNTER — Encounter: Payer: Self-pay | Admitting: Obstetrics & Gynecology

## 2018-07-02 ENCOUNTER — Ambulatory Visit (INDEPENDENT_AMBULATORY_CARE_PROVIDER_SITE_OTHER): Payer: No Typology Code available for payment source | Admitting: Obstetrics & Gynecology

## 2018-07-02 VITALS — BP 106/88 | HR 91 | Ht 65.0 in | Wt 274.0 lb

## 2018-07-02 DIAGNOSIS — N939 Abnormal uterine and vaginal bleeding, unspecified: Secondary | ICD-10-CM

## 2018-07-02 MED ORDER — MEGESTROL ACETATE 40 MG PO TABS: 40.0000 mg | ORAL_TABLET | Freq: Every day | ORAL | 5 refills | Status: DC

## 2018-07-02 NOTE — Progress Notes (Signed)
History:  47 y.o. [id number] here today for f/u of AUB. Pt has ESSURE. She wishes it could ber removed but, does not want surgery. She reports that her bleeding is well controlled with the Megace and she denies side effects. She stopped the Megace 3 days ago as her sx resolved.   The following portions of the patient's history were reviewed and updated as appropriate: allergies, current medications, past family history, past medical history, past social history, past surgical history and problem list.  Review of Systems:  Pertinent items are noted in HPI.    Objective:  Physical Exam Blood pressure 106/88, pulse 91, height 5\' 5"  (1.651 m), weight 274 lb (124.3 kg).  CONSTITUTIONAL: Well-developed, well-nourished female in no acute distress.  HENT:  Normocephalic, atraumatic EYES: Conjunctivae and EOM are normal. No scleral icterus.  NECK: Normal range of motion SKIN: Skin is warm and dry. No rash noted. Not diaphoretic.No pallor. [place]: Alert and oriented to person, place, and time. Normal coordination.    Labs and Imaging 05/31/2018 Diagnosis Endometrium, biopsy - DEGENERATING SECRETORY ENDOMETRIUM. - NO HYPERPLASIA OR MALIGNANCY.  05/31/2018 CLINICAL DATA:  Patient with abnormal uterine bleeding.  EXAM: TRANSABDOMINAL AND TRANSVAGINAL ULTRASOUND OF PELVIS  TECHNIQUE: Both transabdominal and transvaginal ultrasound examinations of the pelvis were performed. Transabdominal technique was performed for global imaging of the pelvis including uterus, ovaries, adnexal regions, and pelvic cul-de-sac. It was necessary to proceed with endovaginal exam following the transabdominal exam to visualize the endometrium and adnexal structures.  COMPARISON:  None  FINDINGS: Uterus  Measurements: 9.6 x 5.2 x 6.2 cm. Within the anterior uterine body there is a 1.2 x 0.9 x 1.1 cm subserosal fibroid. Within the posterior uterine body there is a 0.8 x 0.6 x 0.8 cm  intramural fibroid.  Endometrium  Thickness: 13 mm.  Mildly heterogeneous in echogenicity.  Right ovary  Measurements: 4.2 x 2.3 x 2.9 cm. Normal appearance/no adnexal mass.  Left ovary  Measurements: 1.7 x 2.0 x 1.8 cm. Normal appearance/no adnexal mass.  Other findings  Trace fluid in the pelvis.  IMPRESSION: Endometrium measures 13 mm. If bleeding remains unresponsive to hormonal or medical therapy, sonohysterogram should be considered for focal lesion work-up. (Ref: Radiological Reasoning: Algorithmic Workup of Abnormal Vaginal Bleeding with Endovaginal Sonography and Sonohysterography. AJR 2008; 811:S31-59)  Probable small fibroids within the uterus  Assessment & Plan:  AUB- pt with normal EMx and [place] that showed small fibroids. Sx imporvioed on Megace with no side effect. Reviewed use of continued progestins vs OCPs to manage bleeding.  Pt opts for continued progestins.  Rec continued use of Megace for 3-6 months.  F/u in 6 months or sooner prn  Total face-to-face time with patient was 15 min.  Greater than 50% was spent in counseling and coordination of care with the patient.   Atavia Poppe L. Harraway-Smith, M.D., [name]

## 2018-07-02 NOTE — Patient Instructions (Signed)
Uterine Fibroids Uterine fibroids are tissue masses (tumors) that can develop in the womb (uterus). They are also called leiomyomas. This type of tumor is not cancerous (benign) and does not spread to other parts of the body outside of the pelvic area, which is between the hip bones. Occasionally, fibroids may develop in the fallopian tubes, in the cervix, or on the support structures (ligaments) that surround the uterus. You can have one or many fibroids. Fibroids can vary in size, weight, and where they grow in the uterus. Some can become quite large. Most fibroids do not require medical treatment. What are the causes? A fibroid can develop when a single uterine cell keeps growing (replicating). Most cells in the human body have a control mechanism that keeps them from replicating without control. What are the signs or symptoms? Symptoms may include:  Heavy bleeding during your period.  Bleeding or spotting between periods.  Pelvic pain and pressure.  Bladder problems, such as needing to urinate more often (urinary frequency) or urgently.  Inability to reproduce offspring (infertility).  Miscarriages.  How is this diagnosed? Uterine fibroids are diagnosed through a physical exam. Your health care provider may feel the lumpy tumors during a pelvic exam. Ultrasonography and an MRI may be done to determine the size, location, and number of fibroids. How is this treated? Treatment may include:  Watchful waiting. This involves getting the fibroid checked by your health care provider to see if it grows or shrinks. Follow your health care provider's recommendations for how often to have this checked.  Hormone medicines. These can be taken by mouth or given through an intrauterine device (IUD).  Surgery. ? Removing the fibroids (myomectomy) or the uterus (hysterectomy). ? Removing blood supply to the fibroids (uterine artery embolization).  If fibroids interfere with your fertility and you  want to become pregnant, your health care provider may recommend having the fibroids removed. Follow these instructions at home:  Keep all follow-up visits as directed by your health care provider. This is important.  Take over-the-counter and prescription medicines only as told by your health care provider. ? If you were prescribed a hormone treatment, take the hormone medicines exactly as directed.  Ask your health care provider about taking iron pills and increasing the amount of dark green, leafy vegetables in your diet. These actions can help to boost your blood iron levels, which may be affected by heavy menstrual bleeding.  Pay close attention to your period and tell your health care provider about any changes, such as: ? Increased blood flow that requires you to use more pads or tampons than usual per month. ? A change in the number of days that your period lasts per month. ? A change in symptoms that are associated with your period, such as abdominal cramping or back pain. Contact a health care provider if:  You have pelvic pain, back pain, or abdominal cramps that cannot be controlled with medicines.  You have an increase in bleeding between and during periods.  You soak tampons or pads in a half hour or less.  You feel lightheaded, extra tired, or weak. Get help right away if:  You faint.  You have a sudden increase in pelvic pain. This information is not intended to replace advice given to you by your health care provider. Make sure you discuss any questions you have with your health care provider. Document Released: 09/16/2000 Document Revised: 05/19/2016 Document Reviewed: 03/18/2014 Elsevier Interactive Patient Education  2018 Elsevier Inc.  

## 2018-07-02 NOTE — Progress Notes (Signed)
Pt states bleeding is much better and did not take her megace last two days.   Crosby Oyster, RN

## 2018-07-30 ENCOUNTER — Encounter: Payer: Self-pay | Admitting: Pediatrics

## 2018-07-30 ENCOUNTER — Ambulatory Visit (INDEPENDENT_AMBULATORY_CARE_PROVIDER_SITE_OTHER): Payer: No Typology Code available for payment source | Admitting: Pediatrics

## 2018-07-30 VITALS — BP 120/80 | HR 86 | Temp 98.4°F | Resp 16 | Ht 64.76 in | Wt 273.6 lb

## 2018-07-30 DIAGNOSIS — J4520 Mild intermittent asthma, uncomplicated: Secondary | ICD-10-CM | POA: Diagnosis not present

## 2018-07-30 DIAGNOSIS — J3089 Other allergic rhinitis: Secondary | ICD-10-CM | POA: Insufficient documentation

## 2018-07-30 DIAGNOSIS — Z6841 Body Mass Index (BMI) 40.0 and over, adult: Secondary | ICD-10-CM | POA: Diagnosis not present

## 2018-07-30 DIAGNOSIS — J45909 Unspecified asthma, uncomplicated: Secondary | ICD-10-CM | POA: Insufficient documentation

## 2018-07-30 MED ORDER — ALBUTEROL SULFATE HFA 108 (90 BASE) MCG/ACT IN AERS: INHALATION_SPRAY | RESPIRATORY_TRACT | 2 refills | Status: DC

## 2018-07-30 MED FILL — VENTOLIN HFA 90 MCG INHALER: 108 (90 BAS | 17 days supply | Qty: 18 | Fill #0

## 2018-07-30 NOTE — Progress Notes (Signed)
[address] [place] [address] Dept: [phone]  New Patient Note  Patient ID: Stephanie Mcconnell, female    DOB: 1971-09-20  Age: 47 y.o. MRN: [id number] Date of Office Visit: 07/30/2018 Referring provider: Shelda Pal, DO [address] [place], [place] [address]    Chief Complaint: Allergic Rhinitis   HPI [name] presents for evaluation of allergic symptoms.  She has had a runny nose, stuffy nose, itchy throat, itchy eyes, sneezing and watery eyes for several years.  At times she has some coughing spells and tightness in the chest when she is having allergic symptoms.  Her symptoms can be perennial She has aggravation of her symptoms on exposure to dust, cigarette smoke, strong odors and dogs.  Her symptoms improved with an injection of Depo-Medrol on 06/28/2018.  She has been on Xyzal 5 mg once a day, fluticasone 2 sprays per nostril once a day, Azelastine  0.1% - 1 spray per nostril twice a day and Singular 10 mg once a day  Review of Systems  Constitutional: Negative.   HENT:       Sneezing and nasal congestion for several years  Eyes:       Itchy eyes at times  Respiratory:       Coughing and chest congestion at times with allergic symptoms .  History of contact with tuberculosis.  She received 6 months of prophylactic therapy  Cardiovascular: Negative.   Gastrointestinal: Negative.   Musculoskeletal:       Umbilical hernia repaired  Skin: Negative.   Neurological:       History of a bulging disc  Endo/Heme/Allergies:       No diabetes or thyroid disease  Psychiatric/Behavioral: Negative.     Outpatient Encounter Medications as of 07/30/2018  Medication Sig  . azelastine (ASTELIN) 0.1 % nasal spray Place 1 spray into both nostrils 2 (two) times daily. Use in each nostril as directed (Patient taking differently: Place 1 spray into both nostrils 2 (two) times daily as needed. Use in each nostril as directed)  . cyclobenzaprine  (FLEXERIL) 10 MG tablet Take 1 tablet (10 mg total) by mouth 3 (three) times daily as needed for muscle spasms.  . fluticasone (FLONASE) 50 MCG/ACT nasal spray Place 2 sprays into both nostrils daily. (Patient taking differently: Place 2 sprays into both nostrils daily as needed. )  . levocetirizine (XYZAL) 5 MG tablet Take 1 tablet (5 mg total) by mouth every evening.  . megestrol (MEGACE) 40 MG tablet Take 1 tablet (40 mg total) by mouth daily. Can increase to two tablets twice a day in the event of heavy bleeding  . montelukast (SINGULAIR) 10 MG tablet Take 1 tablet (10 mg total) by mouth at bedtime.  . Multiple Vitamin (MULTIVITAMIN) capsule Take 1 capsule by mouth daily.  . [DISCONTINUED] megestrol (MEGACE) 40 MG tablet Take 1 tablet (40 mg total) by mouth 2 (two) times daily. Take 2 tablets twice a day for 1 week then 1 tablet daily.  Marland Kitchen albuterol (VENTOLIN HFA) 108 (90 Base) MCG/ACT inhaler 2 puffs every 4 hours as needed for wheezing or coughing spells. May use 5 to 15 minutes before exercise.   No facility-administered encounter medications on file as of 07/30/2018.      Drug Allergies:  No Known Allergies  Family History: Marlene's family history is not on file..  Family history is positive for asthma in a daughter.  Family history is negative for hay fever, sinus  problems, angioedema, eczema, hives, food allergies, chronic bronchitis or emphysema.  Social and environmental.  They have had a dog at home for 6 years.  She is not exposed to cigarette smoking.  She has not smoked cigarettes in the past.  She is a English as a second language teacher from [place]  Physical Exam: BP 120/80   Pulse 86   Temp 98.4 F (36.9 C) (Oral)   Resp 16   Ht 5' 4.76" (1.645 m)   Wt 273 lb 9.6 oz (124.1 kg)   SpO2 96%   BMI 45.86 kg/m    Physical Exam  Constitutional: She is oriented to person, place, and time. She appears well-developed and well-nourished.  HENT:  Eyes normal.  Ears normal.  Nose moderate  swelling of nasal turbinates with a clear nasal discharge.  Pharynx normal.  Neck: Neck supple. No thyromegaly present.  Cardiovascular:  S1-S2 normal no murmur  Pulmonary/Chest:  Clear to percussion and auscultation  Abdominal: Soft. There is no tenderness.  No hepatosplenomegaly  Lymphadenopathy:    She has no cervical adenopathy.  Neurological: She is alert and oriented to person, place, and time.  Skin:  Clear  Psychiatric: She has a normal mood and affect. Her behavior is normal. Judgment and thought content normal.  Vitals reviewed.   Diagnostics: Allergy skin tests were positive to maple pollen, dust mite, cat, dog, horse, mouse.  Slight reaction noted to some molds  FVC 2.34 L FEV1 2.21 L.  Predicted FVC 3.09 L predicted FEV1 2.50 L.  After albuterol 2 puffs FVC 2.43 L FEV1 2.33 L- this shows a minimal reduction in the forced vital capacity with no significant improvement after albuterol   Assessment  Assessment and Plan: 1. Other allergic rhinitis   2. Mild intermittent reactive airway disease without complication   3. Morbid obesity with BMI of 45.0-49.9, adult ([place])     Meds ordered this encounter  Medications  . albuterol (VENTOLIN HFA) 108 (90 Base) MCG/ACT inhaler    Sig: 2 puffs every 4 hours as needed for wheezing or coughing spells. May use 5 to 15 minutes before exercise.    Dispense:  1 Inhaler    Refill:  2    Patient Instructions  Environmental control of dust mite and mold Xyzal 5 mg-take 1 tablet once a day for runny nose or itchy eyes Add azelastine  0.1% -2 sprays per nostril , twice a day , if the runny nose is not well controlled Opcon  A-1 drop 3 times a day if needed for itchy eyes  Fluticasone 2 sprays per nostril at night for stuffy nose Montelukast 10 mg-take 1 tablet once a day to prevent coughing or wheezing Ventolin 2 puffs every 4 hours if needed for wheezing or coughing spells.  You may use Ventolin 2 puffs 5 to 15 minutes before  exercise Call us if you are not doing well on this treatment plan Continue on your other medications Your husband should vacuum when you are not around the house  Keep the dog out of your bedroom   Return in about 6 weeks (around 09/10/2018).   Thank you for the opportunity to care for this patient.  Please do not hesitate to contact me with questions.  Penne Lash, M.D.  [facility] [address] [place], [place] [address] [phone]

## 2018-07-30 NOTE — Patient Instructions (Addendum)
Environmental control of dust mite and mold Xyzal 5 mg-take 1 tablet once a day for runny nose or itchy eyes Add azelastine  0.1% -2 sprays per nostril , twice a day , if the runny nose is not well controlled Opcon  A-1 drop 3 times a day if needed for itchy eyes  Fluticasone 2 sprays per nostril at night for stuffy nose Montelukast 10 mg-take 1 tablet once a day to prevent coughing or wheezing Ventolin 2 puffs every 4 hours if needed for wheezing or coughing spells.  You may use Ventolin 2 puffs 5 to 15 minutes before exercise Call us if you are not doing well on this treatment plan Continue on your other medications Your husband should vacuum when you are not around the house  Keep the dog out of your bedroom

## 2018-08-10 MED FILL — MEGESTROL 40 MG TABLET: 40 | 15 days supply | Qty: 60 | Fill #2

## 2018-08-10 MED FILL — LEVOCETIRIZINE 5 MG TABLET: 5 | 30 days supply | Qty: 30 | Fill #2

## 2018-09-04 ENCOUNTER — Telehealth: Payer: Self-pay

## 2018-09-04 NOTE — Telephone Encounter (Signed)
Pt sent Mychart message stating that she has started bleeding again. Pt states that she has been taking her Megace 40mg  bid. Per Dr. Maylene RoesTamala Julian, MD, pt can increase Megace to 80mg  bid and if that doesn't work, pt should consider an  IUD to control bleeding or come in to discuss other treatment options. Pt is scheduled to come in on 09/10/18. Understanding was voiced.

## 2018-09-10 ENCOUNTER — Encounter: Payer: Self-pay | Admitting: Obstetrics & Gynecology

## 2018-09-10 ENCOUNTER — Ambulatory Visit (INDEPENDENT_AMBULATORY_CARE_PROVIDER_SITE_OTHER): Payer: No Typology Code available for payment source | Admitting: Obstetrics & Gynecology

## 2018-09-10 VITALS — BP 121/87 | HR 100 | Ht 65.0 in | Wt 278.0 lb

## 2018-09-10 DIAGNOSIS — N938 Other specified abnormal uterine and vaginal bleeding: Secondary | ICD-10-CM | POA: Diagnosis not present

## 2018-09-10 DIAGNOSIS — N939 Abnormal uterine and vaginal bleeding, unspecified: Secondary | ICD-10-CM

## 2018-09-10 DIAGNOSIS — D219 Benign neoplasm of connective and other soft tissue, unspecified: Secondary | ICD-10-CM | POA: Diagnosis not present

## 2018-09-10 NOTE — Progress Notes (Signed)
Patient states she has had abnormal bleeding since July. Patient states that bleeding was helped by Megace but now in November she has notice more bleeding (even after an increase in the Megace).  Kathrene Alu RN

## 2018-09-10 NOTE — Patient Instructions (Signed)
Endometrial Ablation Endometrial ablation is a procedure that destroys the thin inner layer of the lining of the uterus (endometrium). This procedure may be done:  To stop heavy periods.  To stop bleeding that is causing anemia.  To control irregular bleeding.  To treat bleeding caused by small tumors (fibroids) in the endometrium.  This procedure is often an alternative to major surgery, such as removal of the uterus and cervix (hysterectomy). As a result of this procedure:  You may not be able to have children. However, if you are premenopausal (you have not gone through menopause): ? You may still have a small chance of getting pregnant. ? You will need to use a reliable method of birth control after the procedure to prevent pregnancy.  You may stop having a menstrual period, or you may have only a small amount of bleeding during your period. Menstruation may return several years after the procedure.  Tell a health care provider about:  Any allergies you have.  All medicines you are taking, including vitamins, herbs, eye drops, creams, and over-the-counter medicines.  Any problems you or family members have had with the use of anesthetic medicines.  Any blood disorders you have.  Any surgeries you have had.  Any medical conditions you have. What are the risks? Generally, this is a safe procedure. However, problems may occur, including:  A hole (perforation) in the uterus or bowel.  Infection of the uterus, bladder, or vagina.  Bleeding.  Damage to other structures or organs.  An air bubble in the lung (air embolus).  Problems with pregnancy after the procedure.  Failure of the procedure.  Decreased ability to diagnose cancer in the endometrium.  What happens before the procedure?  You will have tests of your endometrium to make sure there are no pre-cancerous cells or cancer cells present.  You may have an ultrasound of the uterus.  You may be given  medicines to thin the endometrium.  Ask your health care provider about: ? Changing or stopping your regular medicines. This is especially important if you take diabetes medicines or blood thinners. ? Taking medicines such as aspirin and ibuprofen. These medicines can thin your blood. Do not take these medicines before your procedure if your doctor tells you not to.  Plan to have someone take you home from the hospital or clinic. What happens during the procedure?  You will lie on an exam table with your feet and legs supported as in a pelvic exam.  To lower your risk of infection: ? Your health care team will wash or sanitize their hands and put on germ-free (sterile) gloves. ? Your genital area will be washed with soap.  An IV tube will be inserted into one of your veins.  You will be given a medicine to help you relax (sedative).  A surgical instrument with a light and camera (resectoscope) will be inserted into your vagina and moved into your uterus. This allows your surgeon to see inside your uterus.  Endometrial tissue will be removed using one of the following methods: ? Radiofrequency. This method uses a radiofrequency-alternating electric current to remove the endometrium. ? Cryotherapy. This method uses extreme cold to freeze the endometrium. ? Heated-free liquid. This method uses a heated saltwater (saline) solution to remove the endometrium. ? Microwave. This method uses high-energy microwaves to heat up the endometrium and remove it. ? Thermal balloon. This method involves inserting a catheter with a balloon tip into the uterus. The balloon tip is   filled with heated fluid to remove the endometrium. The procedure may vary among health care providers and hospitals. What happens after the procedure?  Your blood pressure, heart rate, breathing rate, and blood oxygen level will be monitored until the medicines you were given have worn off.  As tissue healing occurs, you may  notice vaginal bleeding for 4-6 weeks after the procedure. You may also experience: ? Cramps. ? Thin, watery vaginal discharge that is light pink or brown in color. ? A need to urinate more frequently than usual. ? Nausea.  Do not drive for 24 hours if you were given a sedative.  Do not have sex or insert anything into your vagina until your health care provider approves. Summary  Endometrial ablation is done to treat the many causes of heavy menstrual bleeding.  The procedure may be done only after medications have been tried to control the bleeding.  Plan to have someone take you home from the hospital or clinic. This information is not intended to replace advice given to you by your health care provider. Make sure you discuss any questions you have with your health care provider. Document Released: 07/29/2004 Document Revised: 10/06/2016 Document Reviewed: 10/06/2016 Elsevier Interactive Patient Education  2017 Elsevier Inc.  

## 2018-09-10 NOTE — Progress Notes (Signed)
History:  47 y.o. [id number] here today for f/u of AUB. Pt reports that after starting the Megace the bleeding stopped. The bleeding started to increased. She was taking 40mg  bid.  And she increased to 80mg  bid. She stooped on the meds on this past WED. She wants definitive tx but, she does not want major surgery.       The following portions of the patient's history were reviewed and updated as appropriate: allergies, current medications, past family history, past medical history, past social history, past surgical history and problem list.  Review of Systems:  Pertinent items are noted in HPI.    Objective:  Physical Exam Blood pressure 121/87, pulse 100, height 5\' 5"  (1.651 m), weight 278 lb (126.1 kg).  CONSTITUTIONAL: Well-developed, well-nourished female in no acute distress.  HENT:  Normocephalic, atraumatic EYES: Conjunctivae and EOM are normal. No scleral icterus.  NECK: Normal range of motion SKIN: Skin is warm and dry. No rash noted. Not diaphoretic.No pallor. [place]: Alert and oriented to person, place, and time. Normal coordination.    Labs and Imaging 05/31/2018 Diagnosis Endometrium, biopsy - DEGENERATING SECRETORY ENDOMETRIUM. - NO HYPERPLASIA OR MALIGNANCY.  05/31/2028 CLINICAL DATA:  Patient with abnormal uterine bleeding.  EXAM: TRANSABDOMINAL AND TRANSVAGINAL ULTRASOUND OF PELVIS  TECHNIQUE: Both transabdominal and transvaginal ultrasound examinations of the pelvis were performed. Transabdominal technique was performed for global imaging of the pelvis including uterus, ovaries, adnexal regions, and pelvic cul-de-sac. It was necessary to proceed with endovaginal exam following the transabdominal exam to visualize the endometrium and adnexal structures.  COMPARISON:  None  FINDINGS: Uterus  Measurements: 9.6 x 5.2 x 6.2 cm. Within the anterior uterine body there is a 1.2 x 0.9 x 1.1 cm subserosal fibroid. Within the posterior uterine body there is  a 0.8 x 0.6 x 0.8 cm intramural fibroid.  Endometrium  Thickness: 13 mm.  Mildly heterogeneous in echogenicity.  Right ovary  Measurements: 4.2 x 2.3 x 2.9 cm. Normal appearance/no adnexal mass.  Left ovary  Measurements: 1.7 x 2.0 x 1.8 cm. Normal appearance/no adnexal mass.  Other findings  Trace fluid in the pelvis.  IMPRESSION: Endometrium measures 13 mm. If bleeding remains unresponsive to hormonal or medical therapy, sonohysterogram should be considered for focal lesion work-up. (Ref: Radiological Reasoning: Algorithmic Workup of Abnormal Vaginal Bleeding with Endovaginal Sonography and Sonohysterography. AJR 2008; 308:M57-84)  Probable small fibroids within the uterus.   Assessment & Plan:  AUB- not responding to meds. Pt requests ablation.   Patient desires surgical management with hysteroscopy with endometrial ablation.  The risks of surgery were discussed in detail with the patient including but not limited to: bleeding which may require transfusion or reoperation; infection which may require prolonged hospitalization or re-hospitalization and antibiotic therapy; injury to bowel, bladder, ureters and major vessels or other surrounding organs; need for additional procedures including laparotomy; thromboembolic phenomenon, incisional problems and other postoperative or anesthesia complications.  Patient was told that the likelihood that her condition and symptoms will be treated effectively with this surgical management was very high; the postoperative expectations were also discussed in detail. The patient also understands the alternative treatment options which were discussed in full. All questions were answered.  She was told that she will be contacted by our surgical scheduler regarding the time and date of her surgery; routine preoperative instructions of having nothing to eat or drink after midnight on the day prior to surgery and also coming to the hospital 1  1/2 hours prior to her  time of surgery were also emphasized.  She was told she may be called for a preoperative appointment about a week prior to surgery and will be given further preoperative instructions at that visit. Printed patient education handouts about the procedure were given to the patient to review at home.  Total face-to-face time with patient was 20 min.  Greater than 50% was spent in counseling and coordination of care with the patient.   Keyleigh Manninen L. Harraway-Smith, M.D., [name]

## 2018-09-13 ENCOUNTER — Encounter (HOSPITAL_COMMUNITY): Payer: Self-pay

## 2018-09-21 ENCOUNTER — Telehealth: Payer: Self-pay | Admitting: *Deleted

## 2018-09-21 NOTE — Telephone Encounter (Signed)
Pt has lab appt on 09/24/18 for lipids and CMP but I do not see future orders in Epic. Please verify and place appropriate orders. Thank you!

## 2018-09-24 ENCOUNTER — Other Ambulatory Visit: Payer: No Typology Code available for payment source

## 2018-09-24 ENCOUNTER — Other Ambulatory Visit: Payer: Self-pay | Admitting: Family Medicine

## 2018-09-24 DIAGNOSIS — R739 Hyperglycemia, unspecified: Secondary | ICD-10-CM

## 2018-09-24 DIAGNOSIS — D509 Iron deficiency anemia, unspecified: Secondary | ICD-10-CM

## 2018-09-28 ENCOUNTER — Other Ambulatory Visit (INDEPENDENT_AMBULATORY_CARE_PROVIDER_SITE_OTHER): Payer: No Typology Code available for payment source

## 2018-09-28 DIAGNOSIS — D509 Iron deficiency anemia, unspecified: Secondary | ICD-10-CM

## 2018-09-28 DIAGNOSIS — R739 Hyperglycemia, unspecified: Secondary | ICD-10-CM

## 2018-09-28 LAB — COMPREHENSIVE METABOLIC PANEL
ALT: 13 U/L (ref 0–35)
AST: 15 U/L (ref 0–37)
Albumin: 4.2 g/dL (ref 3.5–5.2)
Alkaline Phosphatase: 42 U/L (ref 39–117)
BILIRUBIN TOTAL: 0.4 mg/dL (ref 0.2–1.2)
BUN: 14 mg/dL (ref 6–23)
CHLORIDE: 106 meq/L (ref 96–112)
CO2: 27 meq/L (ref 19–32)
CREATININE: 0.81 mg/dL (ref 0.40–1.20)
Calcium: 9.4 mg/dL (ref 8.4–10.5)
GFR: 97.55 mL/min (ref >60.00)
Glucose, Bld: 114 mg/dL — ABNORMAL HIGH (ref 70–99)
Potassium: 5 mEq/L (ref 3.5–5.1)
Sodium: 140 mEq/L (ref 135–145)
Total Protein: 6.9 g/dL (ref 6.0–8.3)

## 2018-09-28 LAB — CBC WITH DIFFERENTIAL/PLATELET
Basophils Absolute: 0 10*3/uL (ref 0.0–0.1)
Basophils Relative: 0.5 % (ref 0.0–3.0)
Eosinophils Absolute: 0.2 10*3/uL (ref 0.0–0.7)
Eosinophils Relative: 2.1 % (ref 0.0–5.0)
HCT: 35.5 % — ABNORMAL LOW (ref 36.0–46.0)
Hemoglobin: 11.2 g/dL — ABNORMAL LOW (ref 12.0–15.0)
Lymphocytes Relative: 36.9 % (ref 12.0–46.0)
Lymphs Abs: 2.6 10*3/uL (ref 0.7–4.0)
MCHC: 31.5 g/dL (ref 30.0–36.0)
MCV: 74.8 fl — ABNORMAL LOW (ref 78.0–100.0)
MONO ABS: 0.5 10*3/uL (ref 0.1–1.0)
Monocytes Relative: 7.2 % (ref 3.0–12.0)
NEUTROS ABS: 3.8 10*3/uL (ref 1.4–7.7)
Neutrophils Relative %: 53.3 % (ref 43.0–77.0)
Platelets: 255 10*3/uL (ref 150.0–400.0)
RBC: 4.74 Mil/uL (ref 3.87–5.11)
RDW: 19 % — AB (ref 11.5–15.5)
WBC: 7.1 10*3/uL (ref 4.0–10.5)

## 2018-09-28 LAB — HEMOGLOBIN A1C: HEMOGLOBIN A1C: 6.4 % (ref 4.6–6.5)

## 2018-10-01 ENCOUNTER — Other Ambulatory Visit: Payer: No Typology Code available for payment source

## 2018-10-01 DIAGNOSIS — D509 Iron deficiency anemia, unspecified: Secondary | ICD-10-CM

## 2018-10-02 ENCOUNTER — Encounter (HOSPITAL_COMMUNITY): Admission: RE | Payer: Self-pay | Source: Home / Self Care

## 2018-10-02 ENCOUNTER — Ambulatory Visit (HOSPITAL_COMMUNITY)
Admission: RE | Admit: 2018-10-02 | Payer: No Typology Code available for payment source | Source: Home / Self Care | Admitting: Obstetrics & Gynecology

## 2018-10-02 LAB — IRON, TOTAL/TOTAL IRON BINDING CAP
%SAT: 7 % (calc) — ABNORMAL LOW (ref 16–45)
IRON: 32 ug/dL — AB (ref 40–190)
TIBC: 461 mcg/dL (calc) — ABNORMAL HIGH (ref 250–450)

## 2018-10-02 LAB — FERRITIN: Ferritin: 10 ng/mL — ABNORMAL LOW (ref 16–232)

## 2018-10-02 SURGERY — DILATATION & CURETTAGE/HYSTEROSCOPY WITH NOVASURE ABLATION
Anesthesia: Choice

## 2018-10-09 ENCOUNTER — Other Ambulatory Visit: Payer: Self-pay | Admitting: *Deleted

## 2018-10-09 DIAGNOSIS — D509 Iron deficiency anemia, unspecified: Secondary | ICD-10-CM

## 2018-11-07 MED FILL — AZELASTINE HCL 137 MCG SPRY: 0.1 | 50 days supply | Qty: 30 | Fill #2

## 2018-11-07 MED FILL — FLUTICASONE PROP 50 MCG SPR: 50 | 30 days supply | Qty: 16 | Fill #2

## 2018-11-07 MED FILL — LEVOCETIRIZINE 5 MG TABLET: 5 | 30 days supply | Qty: 30 | Fill #3

## 2018-12-20 ENCOUNTER — Ambulatory Visit (INDEPENDENT_AMBULATORY_CARE_PROVIDER_SITE_OTHER): Payer: No Typology Code available for payment source | Admitting: Family Medicine

## 2018-12-20 ENCOUNTER — Encounter: Payer: Self-pay | Admitting: Family Medicine

## 2018-12-20 ENCOUNTER — Other Ambulatory Visit: Payer: Self-pay

## 2018-12-20 VITALS — BP 122/84 | HR 83 | Temp 98.2°F | Resp 18 | Ht 65.0 in | Wt 287.0 lb

## 2018-12-20 DIAGNOSIS — J029 Acute pharyngitis, unspecified: Secondary | ICD-10-CM

## 2018-12-20 DIAGNOSIS — R52 Pain, unspecified: Secondary | ICD-10-CM

## 2018-12-20 DIAGNOSIS — J302 Other seasonal allergic rhinitis: Secondary | ICD-10-CM | POA: Diagnosis not present

## 2018-12-20 LAB — POC INFLUENZA A&B (BINAX/QUICKVUE)
Influenza A, POC: NEGATIVE
Influenza B, POC: NEGATIVE

## 2018-12-20 LAB — POCT RAPID STREP A (OFFICE): Rapid Strep A Screen: NEGATIVE

## 2018-12-20 MED ORDER — MONTELUKAST SODIUM 10 MG PO TABS: 10.0000 mg | ORAL_TABLET | Freq: Every day | ORAL | 3 refills | Status: DC

## 2018-12-20 MED FILL — AZELASTINE HCL 137 MCG SPRY: 0.1 | 50 days supply | Qty: 30 | Fill #3

## 2018-12-20 MED FILL — LEVOCETIRIZINE 5 MG TABLET: 5 | 30 days supply | Qty: 30 | Fill #4 | Status: TO

## 2018-12-20 MED FILL — FLUTICASONE PROP 50 MCG SPR: 50 | 30 days supply | Qty: 16 | Fill #3

## 2018-12-20 MED FILL — MONTELUKAST SOD 10 MG TAB: 10 | 30 days supply | Qty: 30 | Fill #0 | Status: TO

## 2018-12-20 NOTE — Patient Instructions (Signed)
I will be in touch with your throat culture In the meantime please let me know if you are not feeling better or if you run any fever  Rest and drink plenty of fluids

## 2018-12-20 NOTE — Progress Notes (Addendum)
[facility] [place], [place], [place] [address] [phone]  Date:  12/20/2018   Name:  Stephanie Mcconnell   DOB:  [birth year]   MRN:  [id number]  PCP:  Ann Held, DO    Chief Complaint: Sore Throat (daughter has strep throat, sore throat, congestion, little cough, no fever, no shortness, no traveling)   History of Present Illness:  Stephanie Mcconnell is a 48 y.o. very pleasant female patient who presents with the following:  Pt of Dr. Etter Sjogren here today with concern of ST  History of RAD, obesity  Her daughter was dx with strep last week- she is feeling better now  Pt noted onset of illness yesterday- ST, body ache.  She figured it was just allergies  Today she is actually feeling better- she has a mild cough, chest congestion  No fever noted  No travel over the last 2 weeks She is a Chartered certified accountant at [employer] She has not had any direct contact with COVID as far as she is aware   Patient Active Problem List   Diagnosis Date Noted  . Reactive airway disease 07/30/2018  . Other allergic rhinitis 07/30/2018  . Morbid obesity with BMI of 45.0-49.9, adult ([place]) 07/30/2018  . Right foot pain 08/12/2017  . Acute pharyngitis 02/02/2016  . Severe obesity (BMI >= 40) ([place]) 08/08/2015    Past Medical History:  Diagnosis Date  . Allergy   . Bulging disc    lower back.   . 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 Urticaria     Past Surgical History:  Procedure Laterality Date  . ENDOSCOPIC VEIN LASER TREATMENT     [facility]  . HERNIA REPAIR    . MASS EXCISION  03/12/2012   LIPOMA-  Procedure: EXCISION MASS;  Surgeon: Madilyn Hook, DO;  Location: [facility];  Service: General;  Laterality: Right;   . VENTRAL HERNIA REPAIR  03/12/2012   Procedure: LAPAROSCOPIC VENTRAL HERNIA;  Surgeon: Madilyn Hook, DO;  Location: [facility];  Service: General;  Laterality: N/A;    Social History   Tobacco  Use  . Smoking status: Never Smoker  . Smokeless tobacco: Never Used  Substance Use Topics  . Alcohol use: Yes    Alcohol/week: 1.0 standard drinks    Types: 1 Glasses of wine per week    Comment: rarely  . Drug use: No    No family history on file.  No Known Allergies  Medication list has been reviewed and updated.  Current Outpatient Medications on File Prior to Visit  Medication Sig Dispense Refill  . albuterol (VENTOLIN HFA) 108 (90 Base) MCG/ACT inhaler 2 puffs every 4 hours as needed for wheezing or coughing spells. May use 5 to 15 minutes before exercise. 1 Inhaler 2  . azelastine (ASTELIN) 0.1 % nasal spray Place 1 spray into both nostrils 2 (two) times daily. Use in each nostril as directed (Patient taking differently: Place 1 spray into both nostrils 2 (two) times daily as needed. Use in each nostril as directed) 30 mL 12  . cyclobenzaprine (FLEXERIL) 10 MG tablet Take 1 tablet (10 mg total) by mouth 3 (three) times daily as needed for muscle spasms. 20 tablet 0  . fluticasone (FLONASE) 50 MCG/ACT nasal spray Place 2 sprays into both nostrils daily. (Patient taking differently: Place 2 sprays into both nostrils daily as needed. )  16 g 6  . levocetirizine (XYZAL) 5 MG tablet Take 1 tablet (5 mg total) by mouth every evening. 30 tablet 5  . megestrol (MEGACE) 40 MG tablet Take 1 tablet (40 mg total) by mouth daily. Can increase to two tablets twice a day in the event of heavy bleeding 30 tablet 5  . montelukast (SINGULAIR) 10 MG tablet Take 1 tablet (10 mg total) by mouth at bedtime. 30 tablet 3  . Multiple Vitamin (MULTIVITAMIN) capsule Take 1 capsule by mouth daily.     No current facility-administered medications on file prior to visit.     Review of Systems:  As per HPI- otherwise negative.   Physical Examination: Vitals:   12/20/18 1136  BP: 122/84  Pulse: 83  Resp: 18  Temp: 98.2 F (36.8 C)  SpO2: 99%   Vitals:   12/20/18 1136  Weight: 287 lb (130.2 kg)   Height: 5\' 5"  (1.651 m)   Body mass index is 47.76 kg/m. Ideal Body Weight: Weight in (lb) to have BMI = 25: 149.9  GEN: WDWN, NAD, Non-toxic, A & O x 3, obese, looks well  HEENT: Atraumatic, Normocephalic. Neck supple. No masses, No LAD.  Bilateral TM wnl, oropharynx inflamed but no exudate.  PEERL,EOMI.   Ears and Nose: No external deformity. CV: RRR, No M/G/R. No JVD. No thrill. No extra heart sounds. PULM: CTA B, no wheezes, crackles, rhonchi. No retractions. No resp. distress. No accessory muscle use. ABD: S, NT, ND, +BS. No rebound. No HSM. EXTR: No c/c/e NEURO Normal gait.  PSYCH: Normally interactive. Conversant. Not depressed or anxious appearing.  Calm demeanor.   Results for orders placed or performed in visit on 12/20/18  Culture, Group A Strep  Result Value Ref Range   MICRO NUMBER: [id number]    SPECIMEN QUALITY: Adequate    SOURCE: THROAT    STATUS: FINAL    RESULT: No group A Streptococcus isolated   POC Influenza A&B (Binax test)  Result Value Ref Range   Influenza A, POC Negative Negative   Influenza B, POC Negative Negative  POCT rapid strep A  Result Value Ref Range   Rapid Strep A Screen Negative Negative    Assessment and Plan: Sore throat - Plan: POCT rapid strep A, Culture, Group A Strep  Body aches - Plan: POC Influenza A&B (Binax test)  Seasonal allergies - Plan: montelukast (SINGULAIR) 10 MG tablet  Here today with sx of mild illness She was worried about strep as her daughter has been ill with same Rapid test negative, culture pending She will continue to use allergy meds, refilled her singulair for her to use as well  At this time she is not high risk for COVD No fever, travel or known contact    Signed Lamar Blinks, MD  3/22- received her throat culture, negative Message to pt

## 2018-12-22 LAB — CULTURE, GROUP A STREP
MICRO NUMBER:: 337069
SPECIMEN QUALITY:: ADEQUATE

## 2018-12-23 ENCOUNTER — Encounter: Payer: Self-pay | Admitting: Family Medicine

## 2019-01-15 ENCOUNTER — Encounter: Payer: No Typology Code available for payment source | Admitting: Family Medicine

## 2019-01-16 ENCOUNTER — Other Ambulatory Visit: Payer: Self-pay | Admitting: Family Medicine

## 2019-01-16 DIAGNOSIS — Z1231 Encounter for screening mammogram for malignant neoplasm of breast: Secondary | ICD-10-CM

## 2019-03-11 MED FILL — MONTELUKAST SOD 10 MG TAB: 10 | 30 days supply | Qty: 30 | Fill #0

## 2019-03-11 MED FILL — LEVOCETIRIZINE 5 MG TABLET: 5 | 30 days supply | Qty: 30 | Fill #0

## 2019-03-20 ENCOUNTER — Other Ambulatory Visit: Payer: Self-pay

## 2019-03-20 ENCOUNTER — Ambulatory Visit
Admission: RE | Admit: 2019-03-20 | Discharge: 2019-03-20 | Disposition: A | Discharge status: Home / Self Care | Payer: No Typology Code available for payment source | Source: Ambulatory Visit | Attending: Family Medicine | Admitting: Family Medicine

## 2019-03-20 DIAGNOSIS — Z1231 Encounter for screening mammogram for malignant neoplasm of breast: Secondary | ICD-10-CM

## 2019-04-02 ENCOUNTER — Encounter: Payer: No Typology Code available for payment source | Admitting: Family Medicine

## 2019-04-11 ENCOUNTER — Other Ambulatory Visit: Payer: Self-pay | Admitting: Family Medicine

## 2019-04-11 ENCOUNTER — Encounter: Payer: No Typology Code available for payment source | Admitting: Family Medicine

## 2019-04-11 DIAGNOSIS — J069 Acute upper respiratory infection, unspecified: Secondary | ICD-10-CM

## 2019-05-09 ENCOUNTER — Ambulatory Visit (INDEPENDENT_AMBULATORY_CARE_PROVIDER_SITE_OTHER): Payer: No Typology Code available for payment source | Admitting: Obstetrics & Gynecology

## 2019-05-09 ENCOUNTER — Other Ambulatory Visit: Payer: Self-pay

## 2019-05-09 ENCOUNTER — Encounter: Payer: Self-pay | Admitting: Obstetrics & Gynecology

## 2019-05-09 VITALS — BP 118/76 | HR 69 | Ht 65.0 in | Wt 294.0 lb

## 2019-05-09 DIAGNOSIS — Z6841 Body Mass Index (BMI) 40.0 and over, adult: Secondary | ICD-10-CM

## 2019-05-09 DIAGNOSIS — Z30430 Encounter for insertion of intrauterine contraceptive device: Secondary | ICD-10-CM | POA: Diagnosis not present

## 2019-05-09 DIAGNOSIS — N939 Abnormal uterine and vaginal bleeding, unspecified: Secondary | ICD-10-CM

## 2019-05-09 DIAGNOSIS — Z01419 Encounter for gynecological examination (general) (routine) without abnormal findings: Secondary | ICD-10-CM | POA: Diagnosis not present

## 2019-05-09 MED ORDER — LEVONORGESTREL 19.5 MCG/DAY IU IUD
INTRAUTERINE_SYSTEM | Freq: Once | INTRAUTERINE | Status: AC
  Administered 2019-05-09: 1 via INTRAUTERINE

## 2019-05-09 NOTE — Patient Instructions (Signed)
Intrauterine Device Insertion, Care After  This sheet gives you information about how to care for yourself after your procedure. Your health care provider may also give you more specific instructions. If you have problems or questions, contact your health care provider. What can I expect after the procedure? After the procedure, it is common to have:  Cramps and pain in the abdomen.  Light bleeding (spotting) or heavier bleeding that is like your menstrual period. This may last for up to a few days.  Lower back pain.  Dizziness.  Headaches.  Nausea. Follow these instructions at home:  Before resuming sexual activity, check to make sure that you can feel the IUD string(s). You should be able to feel the end of the string(s) below the opening of your cervix. If your IUD string is in place, you may resume sexual activity. ? If you had a hormonal IUD inserted more than 7 days after your most recent period started, you will need to use a backup method of birth control for 7 days after IUD insertion. Ask your health care provider whether this applies to you.  Continue to check that the IUD is still in place by feeling for the string(s) after every menstrual period, or once a month.  Take over-the-counter and prescription medicines only as told by your health care provider.  Do not drive or use heavy machinery while taking prescription pain medicine.  Keep all follow-up visits as told by your health care provider. This is important. Contact a health care provider if:  You have bleeding that is heavier or lasts longer than a normal menstrual cycle.  You have a fever.  You have cramps or abdominal pain that get worse or do not get better with medicine.  You develop abdominal pain that is new or is not in the same area of earlier cramping and pain.  You feel lightheaded or weak.  You have abnormal or bad-smelling discharge from your vagina.  You have pain during sexual activity.   You have any of the following problems with your IUD string(s): ? The string bothers or hurts you or your sexual partner. ? You cannot feel the string. ? The string has gotten longer.  You can feel the IUD in your vagina.  You think you may be pregnant, or you miss your menstrual period.  You think you may have an STI (sexually transmitted infection). Get help right away if:  You have flu-like symptoms.  You have a fever and chills.  You can feel that your IUD has slipped out of place. Summary  After the procedure, it is common to have cramps and pain in the abdomen. It is also common to have light bleeding (spotting) or heavier bleeding that is like your menstrual period.  Continue to check that the IUD is still in place by feeling for the string(s) after every menstrual period, or once a month.  Keep all follow-up visits as told by your health care provider. This is important.  Contact your health care provider if you have problems with your IUD string(s), such as the string getting longer or bothering you or your sexual partner. This information is not intended to replace advice given to you by your health care provider. Make sure you discuss any questions you have with your health care provider. Document Released: 05/18/2011 Document Revised: 09/01/2017 Document Reviewed: 08/10/2016 Elsevier Patient Education  2020 [employer]. Levonorgestrel intrauterine device (IUD) What is this medicine? LEVONORGESTREL IUD (LEE voe nor jes  trel) is a contraceptive (birth control) device. The device is placed inside the uterus by a healthcare professional. It is used to prevent pregnancy. This device can also be used to treat heavy bleeding that occurs during your period. This medicine may be used for other purposes; ask your health care provider or pharmacist if you have questions. COMMON BRAND NAME(S): Minette Headland What should I tell my health care provider before I take  this medicine? They need to know if you have any of these conditions:  abnormal Pap smear  cancer of the breast, uterus, or cervix  diabetes  endometritis  genital or pelvic infection now or in the past  have more than one sexual partner or your partner has more than one partner  heart disease  history of an ectopic or tubal pregnancy  immune system problems  IUD in place  liver disease or tumor  problems with blood clots or take blood-thinners  seizures  use intravenous drugs  uterus of unusual shape  vaginal bleeding that has not been explained  an unusual or allergic reaction to levonorgestrel, other hormones, silicone, or polyethylene, medicines, foods, dyes, or preservatives  pregnant or trying to get pregnant  breast-feeding How should I use this medicine? This device is placed inside the uterus by a health care professional. Talk to your pediatrician regarding the use of this medicine in children. Special care may be needed. Overdosage: If you think you have taken too much of this medicine contact a poison control center or emergency room at once. NOTE: This medicine is only for you. Do not share this medicine with others. What if I miss a dose? This does not apply. Depending on the brand of device you have inserted, the device will need to be replaced every 3 to 6 years if you wish to continue using this type of birth control. What may interact with this medicine? Do not take this medicine with any of the following medications:  amprenavir  bosentan  fosamprenavir This medicine may also interact with the following medications:  aprepitant  armodafinil  barbiturate medicines for inducing sleep or treating seizures  bexarotene  boceprevir  griseofulvin  medicines to treat seizures like carbamazepine, ethotoin, felbamate, oxcarbazepine, phenytoin, topiramate  modafinil  pioglitazone  rifabutin  rifampin  rifapentine  some medicines  to treat HIV infection like atazanavir, efavirenz, indinavir, lopinavir, nelfinavir, tipranavir, ritonavir  St. John's wort  warfarin This list may not describe all possible interactions. Give your health care provider a list of all the medicines, herbs, non-prescription drugs, or dietary supplements you use. Also tell them if you smoke, drink alcohol, or use illegal drugs. Some items may interact with your medicine. What should I watch for while using this medicine? Visit your doctor or health care professional for regular check ups. See your doctor if you or your partner has sexual contact with others, becomes HIV positive, or gets a sexual transmitted disease. This product does not protect you against HIV infection (AIDS) or other sexually transmitted diseases. You can check the placement of the IUD yourself by reaching up to the top of your vagina with clean fingers to feel the threads. Do not pull on the threads. It is a good habit to check placement after each menstrual period. Call your doctor right away if you feel more of the IUD than just the threads or if you cannot feel the threads at all. The IUD may come out by itself. You may become pregnant if  the device comes out. If you notice that the IUD has come out use a backup birth control method like condoms and call your health care provider. Using tampons will not change the position of the IUD and are okay to use during your period. This IUD can be safely scanned with magnetic resonance imaging (MRI) only under specific conditions. Before you have an MRI, tell your healthcare provider that you have an IUD in place, and which type of IUD you have in place. What side effects may I notice from receiving this medicine? Side effects that you should report to your doctor or health care professional as soon as possible:  allergic reactions like skin rash, itching or hives, swelling of the face, lips, or tongue  fever, flu-like symptoms   genital sores  high blood pressure  no menstrual period for 6 weeks during use  pain, swelling, warmth in the leg  pelvic pain or tenderness  severe or sudden headache  signs of pregnancy  stomach cramping  sudden shortness of breath  trouble with balance, talking, or walking  unusual vaginal bleeding, discharge  yellowing of the eyes or skin Side effects that usually do not require medical attention (report to your doctor or health care professional if they continue or are bothersome):  acne  breast pain  change in sex drive or performance  changes in weight  cramping, dizziness, or faintness while the device is being inserted  headache  irregular menstrual bleeding within first 3 to 6 months of use  nausea This list may not describe all possible side effects. Call your doctor for medical advice about side effects. You may report side effects to FDA at 1-800-FDA-1088. Where should I keep my medicine? This does not apply. NOTE: This sheet is a summary. It may not cover all possible information. If you have questions about this medicine, talk to your doctor, pharmacist, or health care provider.  2020 Elsevier/Gold Standard (2018-07-31 13:22:01)

## 2019-05-09 NOTE — Progress Notes (Signed)
Subjective:     Stephanie Mcconnell is a 48 y.o. female here for a routine exam.  [employer] employees. Doing well. For annual. Requests a PAP today. Current complaints: cycles reg but, heavy for 3- days. NO intermenstrual bleeding.      Gynecologic History Patient's last menstrual period was 04/11/2019 (exact date). Contraception: ESSURE Last Pap: 03/23/2018. Results were: normal Last mammogram: 03/20/2019. Results were: normal  Obstetric History OB History  Gravida Para Term Preterm AB Living  2 2 2     2   SAB TAB Ectopic Multiple Live Births               # Outcome Date GA Lbr Len/2nd Weight Sex Delivery Anes PTL Lv  2 Term           1 Term            The following portions of the patient's history were reviewed and updated as appropriate: allergies, current medications, past family history, past medical history, past social history, past surgical history and problem list.  Review of Systems Pertinent items are noted in HPI.    Objective:  BP 118/76   Pulse 69   Ht 5\' 5"  (1.651 m)   Wt 294 lb (133.4 kg)   LMP 04/11/2019 (Exact Date)   BMI 48.92 kg/m   General Appearance:    Alert, cooperative, no distress, appears stated age  Head:    Normocephalic, without obvious abnormality, atraumatic  Eyes:    conjunctiva/corneas clear, EOM's intact, both eyes  Ears:    Normal external ear canals, both ears  Nose:   Nares normal, septum midline, mucosa normal, no drainage    or sinus tenderness  Throat:   Lips, mucosa, and tongue normal; teeth and gums normal  Neck:   Supple, symmetrical, trachea midline, no adenopathy;    thyroid:  no enlargement/tenderness/nodules  Back:     Symmetric, no curvature, ROM normal, no CVA tenderness  Lungs:     respirations unlabored  Chest Wall:    No tenderness or deformity   Heart:    Regular rate and rhythm  Breast Exam:    No tenderness, masses, or nipple abnormality  Abdomen:     Soft, non-tender, bowel sounds active all four quadrants,    no masses,  no organomegaly  Genitalia:    Normal female without lesion, discharge or tenderness     Extremities:   Extremities normal, atraumatic, no cyanosis or edema  Pulses:   2+ and symmetric all extremities  Skin:   Skin color, texture, turgor normal, no rashes or lesions  GYNECOLOGY CLINIC PROCEDURE NOTE IUD Insertion Procedure Note Patient identified, informed consent performed.  Discussed risks of irregular bleeding, cramping, infection, malpositioning or misplacement of the IUD outside the uterus which may require further procedures. Time out was performed.  Urine pregnancy test negative.  Speculum placed in the vagina.  Cervix visualized.  Cleaned with Betadine x 2.  Grasped anteriorly with a single tooth tenaculum.  Uterus sounded to 9 cm.  Liletta IUD placed per manufacturer's recommendations.  Strings trimmed to 3 cm. Tenaculum was removed, good hemostasis noted.  Patient tolerated procedure well.    Assessment:    Healthy female exam.   Heavy menses- reviewed options for management. Pt does not want an ablation at present. Pt requested a LnIUD. She previously has a Mirena IUD prior to [employer].  Liltetta IUD placed       Plan:    Follow up in: 4  weeks.  for trig check F/u PAP with hrHPV  F/u in 1 year for annual or sooner prn  Patient was given post-procedure instructions.    Stephanie Mcconnell L. Harraway-Smith, M.D., [name]

## 2019-05-09 NOTE — Addendum Note (Signed)
Addended by: Phill Myron on: 05/09/2019 11:56 AM   Modules accepted: Orders

## 2019-06-05 ENCOUNTER — Other Ambulatory Visit: Payer: Self-pay

## 2019-06-05 ENCOUNTER — Encounter: Payer: Self-pay | Admitting: Obstetrics & Gynecology

## 2019-06-05 ENCOUNTER — Ambulatory Visit (INDEPENDENT_AMBULATORY_CARE_PROVIDER_SITE_OTHER): Payer: No Typology Code available for payment source | Admitting: Obstetrics & Gynecology

## 2019-06-05 VITALS — BP 122/65 | HR 84 | Wt 293.0 lb

## 2019-06-05 DIAGNOSIS — Z30431 Encounter for routine checking of intrauterine contraceptive device: Secondary | ICD-10-CM

## 2019-06-05 DIAGNOSIS — N921 Excessive and frequent menstruation with irregular cycle: Secondary | ICD-10-CM

## 2019-06-05 DIAGNOSIS — Z975 Presence of (intrauterine) contraceptive device: Secondary | ICD-10-CM

## 2019-06-05 NOTE — Patient Instructions (Signed)
Levonorgestrel intrauterine device (IUD) What is this medicine? LEVONORGESTREL IUD (LEE voe nor jes trel) is a contraceptive (birth control) device. The device is placed inside the uterus by a healthcare professional. It is used to prevent pregnancy. This device can also be used to treat heavy bleeding that occurs during your period. This medicine may be used for other purposes; ask your health care provider or pharmacist if you have questions. COMMON BRAND NAME(S): Kyleena, LILETTA, Mirena, Skyla What should I tell my health care provider before I take this medicine? They need to know if you have any of these conditions:  abnormal Pap smear  cancer of the breast, uterus, or cervix  diabetes  endometritis  genital or pelvic infection now or in the past  have more than one sexual partner or your partner has more than one partner  heart disease  history of an ectopic or tubal pregnancy  immune system problems  IUD in place  liver disease or tumor  problems with blood clots or take blood-thinners  seizures  use intravenous drugs  uterus of unusual shape  vaginal bleeding that has not been explained  an unusual or allergic reaction to levonorgestrel, other hormones, silicone, or polyethylene, medicines, foods, dyes, or preservatives  pregnant or trying to get pregnant  breast-feeding How should I use this medicine? This device is placed inside the uterus by a health care professional. Talk to your pediatrician regarding the use of this medicine in children. Special care may be needed. Overdosage: If you think you have taken too much of this medicine contact a poison control center or emergency room at once. NOTE: This medicine is only for you. Do not share this medicine with others. What if I miss a dose? This does not apply. Depending on the brand of device you have inserted, the device will need to be replaced every 3 to 6 years if you wish to continue using this type  of birth control. What may interact with this medicine? Do not take this medicine with any of the following medications:  amprenavir  bosentan  fosamprenavir This medicine may also interact with the following medications:  aprepitant  armodafinil  barbiturate medicines for inducing sleep or treating seizures  bexarotene  boceprevir  griseofulvin  medicines to treat seizures like carbamazepine, ethotoin, felbamate, oxcarbazepine, phenytoin, topiramate  modafinil  pioglitazone  rifabutin  rifampin  rifapentine  some medicines to treat HIV infection like atazanavir, efavirenz, indinavir, lopinavir, nelfinavir, tipranavir, ritonavir  St. John's wort  warfarin This list may not describe all possible interactions. Give your health care provider a list of all the medicines, herbs, non-prescription drugs, or dietary supplements you use. Also tell them if you smoke, drink alcohol, or use illegal drugs. Some items may interact with your medicine. What should I watch for while using this medicine? Visit your doctor or health care professional for regular check ups. See your doctor if you or your partner has sexual contact with others, becomes HIV positive, or gets a sexual transmitted disease. This product does not protect you against HIV infection (AIDS) or other sexually transmitted diseases. You can check the placement of the IUD yourself by reaching up to the top of your vagina with clean fingers to feel the threads. Do not pull on the threads. It is a good habit to check placement after each menstrual period. Call your doctor right away if you feel more of the IUD than just the threads or if you cannot feel the threads at   all. The IUD may come out by itself. You may become pregnant if the device comes out. If you notice that the IUD has come out use a backup birth control method like condoms and call your health care provider. Using tampons will not change the position of the  IUD and are okay to use during your period. This IUD can be safely scanned with magnetic resonance imaging (MRI) only under specific conditions. Before you have an MRI, tell your healthcare provider that you have an IUD in place, and which type of IUD you have in place. What side effects may I notice from receiving this medicine? Side effects that you should report to your doctor or health care professional as soon as possible:  allergic reactions like skin rash, itching or hives, swelling of the face, lips, or tongue  fever, flu-like symptoms  genital sores  high blood pressure  no menstrual period for 6 weeks during use  pain, swelling, warmth in the leg  pelvic pain or tenderness  severe or sudden headache  signs of pregnancy  stomach cramping  sudden shortness of breath  trouble with balance, talking, or walking  unusual vaginal bleeding, discharge  yellowing of the eyes or skin Side effects that usually do not require medical attention (report to your doctor or health care professional if they continue or are bothersome):  acne  breast pain  change in sex drive or performance  changes in weight  cramping, dizziness, or faintness while the device is being inserted  headache  irregular menstrual bleeding within first 3 to 6 months of use  nausea This list may not describe all possible side effects. Call your doctor for medical advice about side effects. You may report side effects to FDA at 1-800-FDA-1088. Where should I keep my medicine? This does not apply. NOTE: This sheet is a summary. It may not cover all possible information. If you have questions about this medicine, talk to your doctor, pharmacist, or health care provider.  2020 Elsevier/Gold Standard (2018-07-31 13:22:01)  

## 2019-06-05 NOTE — Progress Notes (Signed)
History:  48 y.o. [id number] here today for today for IUD string check; LnIUD was placed  05/09/2019. Pt reports that she has been bleeding daily since that time. She was  Bleeding heavy and using a pad an hour PRIOR to the IUD but, now she is using only 3 pads per day. She is worried because she was dizzy one day and is concerned that it was due to the bleeding. She denies pain or other sx.     The following portions of the patient's history were reviewed and updated as appropriate: allergies, current medications, past family history, past medical history, past social history, past surgical history and problem list.   Review of Systems:  Pertinent items are noted in HPI.   Objective:  BP 122/65 (BP Location: Left Arm, Patient Position: Sitting)   Pulse 84   Wt 293 lb (132.9 kg)   BMI 48.76 kg/m    CONSTITUTIONAL: Well-developed, well-nourished female in no acute distress.  HENT:  Normocephalic, atraumatic EYES: Conjunctivae and EOM are normal. No scleral icterus.  NECK: Normal range of motion SKIN: Skin is warm and dry. No rash noted. Not diaphoretic.No pallor. [place]: Alert and oriented to person, place, and time. Normal coordination.  GYN: EGBUS- WNL  Vagina- blood in vault  Cervix_ strings noted 3 cm long in os.   Assessment & Plan:   IUD check. Bleeding improved but, now daily. She was educated on the known side effects of the LnIUD and encouraged that her sx have improved.       rec f/u in 2 months or reassess.  May cancel that appt is sx resolved. CBC today   Patient to keep IUD in place for five years; can come in for removal if she desires pregnancy within the next five years. Routine preventative health maintenance measures emphasized.  Jenia Klepper L. Harraway-Smith, M.D., [name]

## 2019-06-05 NOTE — Progress Notes (Signed)
Patient states she has had bleeding ever since insertion. Patient not happy with Mirena- patient states she even has had one episode of dizziness and felt like she was going to faint. Patient wants IUD out. Kathrene Alu RN

## 2019-06-06 LAB — CBC WITH DIFFERENTIAL/PLATELET
Basophils Absolute: 0 10*3/uL (ref 0.0–0.2)
Basos: 1 %
EOS (ABSOLUTE): 0.2 10*3/uL (ref 0.0–0.4)
Eos: 2 %
Hematocrit: 34.4 % (ref 34.0–46.6)
Hemoglobin: 10.6 g/dL — ABNORMAL LOW (ref 11.1–15.9)
Immature Grans (Abs): 0 10*3/uL (ref 0.0–0.1)
Immature Granulocytes: 0 %
Lymphocytes Absolute: 2.5 10*3/uL (ref 0.7–3.1)
Lymphs: 33 %
MCH: 25.3 pg — ABNORMAL LOW (ref 26.6–33.0)
MCHC: 30.8 g/dL — ABNORMAL LOW (ref 31.5–35.7)
MCV: 82 fL (ref 79–97)
Monocytes Absolute: 0.6 10*3/uL (ref 0.1–0.9)
Monocytes: 7 %
Neutrophils Absolute: 4.3 10*3/uL (ref 1.4–7.0)
Neutrophils: 57 %
Platelets: 293 10*3/uL (ref 150–450)
RBC: 4.19 x10E6/uL (ref 3.77–5.28)
RDW: 14.7 % (ref 11.7–15.4)
WBC: 7.6 10*3/uL (ref 3.4–10.8)

## 2019-07-31 ENCOUNTER — Ambulatory Visit: Payer: No Typology Code available for payment source | Admitting: Obstetrics & Gynecology

## 2019-08-28 MED FILL — FLUTICASONE PROP 50 MCG SPR: 50 | 30 days supply | Qty: 16 | Fill #0

## 2019-08-28 MED FILL — LEVOCETIRIZINE 5 MG TABLET: 5 | 30 days supply | Qty: 30 | Fill #0

## 2019-08-28 MED FILL — MONTELUKAST SOD 10 MG TAB: 10 | 30 days supply | Qty: 30 | Fill #0

## 2019-08-28 MED FILL — AZELASTINE HCL 137 MCG SPRY: 0.1 | 50 days supply | Qty: 30 | Fill #0

## 2019-09-06 ENCOUNTER — Encounter: Payer: Self-pay | Admitting: Podiatry

## 2019-09-06 ENCOUNTER — Ambulatory Visit: Payer: No Typology Code available for payment source | Admitting: Podiatry

## 2019-09-06 ENCOUNTER — Ambulatory Visit (INDEPENDENT_AMBULATORY_CARE_PROVIDER_SITE_OTHER): Payer: No Typology Code available for payment source

## 2019-09-06 ENCOUNTER — Other Ambulatory Visit: Payer: Self-pay

## 2019-09-06 VITALS — BP 135/80

## 2019-09-06 DIAGNOSIS — M79672 Pain in left foot: Secondary | ICD-10-CM

## 2019-09-06 DIAGNOSIS — M722 Plantar fascial fibromatosis: Secondary | ICD-10-CM

## 2019-09-06 DIAGNOSIS — M79671 Pain in right foot: Secondary | ICD-10-CM | POA: Diagnosis not present

## 2019-09-07 ENCOUNTER — Encounter: Payer: Self-pay | Admitting: Podiatry

## 2019-09-07 MED ORDER — METHYLPREDNISOLONE 4 MG PO TBPK: ORAL_TABLET | ORAL | 0 refills | Status: DC

## 2019-09-07 MED FILL — METHYLPREDNISOLONE 4 MG TBP: 4 | 6 days supply | Qty: 21 | Fill #0

## 2019-09-07 NOTE — Progress Notes (Signed)
Subjective:  Patient ID: Stephanie Mcconnell, female    DOB: 1971-01-25,  MRN: [id number]  Chief Complaint  Patient presents with  . Foot Pain    pt is here for bil foot pain, pain has been going on for about 4 months, pt states that it is possible plantar fasciitis    48 y.o. female presents with the above complaint.  Patient presents with complaints of bilateral heel pain.  Patient states that it has been going on for about 4 months.  She states that nothing has helped she has tried icing it as well as taking ibuprofen.  She states that it hurts when applying pressure especially when taking the first step when getting up from resting position to standing or walking position.  She denies any other acute complaints.  She denies seeing anyone else for this.  She is a Human resources officer.   Review of Systems: Negative except as noted in the HPI. Denies N/V/F/Ch.  Past Medical History:  Diagnosis Date  . Allergy   . Bulging disc    lower back.   . 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 Urticaria     Current Outpatient Medications:  .  albuterol (VENTOLIN HFA) 108 (90 Base) MCG/ACT inhaler, 2 puffs every 4 hours as needed for wheezing or coughing spells. May use 5 to 15 minutes before exercise., Disp: 1 Inhaler, Rfl: 2 .  Ascorbic Acid (VITAMIN C) 100 MG tablet, Take 100 mg by mouth daily., Disp: , Rfl:  .  azelastine (ASTELIN) 0.1 % nasal spray, Place 1 spray into both nostrils 2 (two) times daily. Use in each nostril as directed, Disp: 30 mL, Rfl: 12 .  fluticasone (FLONASE) 50 MCG/ACT nasal spray, Place 2 sprays into both nostrils daily. (Patient taking differently: Place 2 sprays into both nostrils daily as needed. ), Disp: 16 g, Rfl: 6 .  levocetirizine (XYZAL) 5 MG tablet, Take 1 tablet (5 mg total) by mouth every evening., Disp: 30 tablet, Rfl: 5 .  montelukast (SINGULAIR) 10 MG tablet, Take 1 tablet (10 mg total) by mouth at bedtime., Disp: 30 tablet,  Rfl: 3 .  Multiple Vitamin (MULTIVITAMIN) capsule, Take 1 capsule by mouth daily., Disp: , Rfl:  .  Zinc 50 MG TABS, Take by mouth., Disp: , Rfl:  .  cyclobenzaprine (FLEXERIL) 10 MG tablet, Take 1 tablet (10 mg total) by mouth 3 (three) times daily as needed for muscle spasms. (Patient not taking: Reported on 05/09/2019), Disp: 20 tablet, Rfl: 0 .  megestrol (MEGACE) 40 MG tablet, Take 1 tablet (40 mg total) by mouth daily. Can increase to two tablets twice a day in the event of heavy bleeding (Patient not taking: Reported on 05/09/2019), Disp: 30 tablet, Rfl: 5 .  methylPREDNISolone (MEDROL DOSEPAK) 4 MG TBPK tablet, Use as directed, Disp: 1 each, Rfl: 0  Social History   Tobacco Use  Smoking Status Never Smoker  Smokeless Tobacco Never Used    No Known Allergies Objective:   Vitals:   09/06/19 1136  BP: 135/80   There is no height or weight on file to calculate BMI. Constitutional Well developed. Well nourished.  Vascular Dorsalis pedis pulses palpable bilaterally. Posterior tibial pulses palpable bilaterally. Capillary refill normal to all digits.  No cyanosis or clubbing noted. Pedal hair growth normal.  Neurologic Normal speech. Oriented to person, place, and time. Epicritic sensation to light touch grossly present bilaterally.  Dermatologic Nails well  groomed and normal in appearance. No open wounds. No skin lesions.  Orthopedic: Normal joint ROM without pain or crepitus bilaterally. No visible deformities. Tender to palpation at the calcaneal tuber bilaterally. No pain with calcaneal squeeze bilaterally. Ankle ROM full range of motion bilaterally. Silfverskiold Test: negative bilaterally.   Radiographs: Taken and reviewed. No acute fractures or dislocations. No evidence of stress fracture.  Plantar heel spur absent. Posterior heel spur present.    Assessment:   1. Plantar fasciitis of right foot   2. Plantar fasciitis of left foot   3. Pain in right foot   4. Pain  in left foot    Plan:  Patient was evaluated and treated and all questions answered.  Plantar Fasciitis, bilaterally - XR reviewed as above.  - Educated on icing and stretching. Instructions given.  - Injection delivered to the plantar fascia as below. - DME: Plantar Fascial Brace x2 - Pharmacologic management: Medrol Dose Pak. Educated on risks/benefits and proper taking of medication.  Procedure: Injection Tendon/Ligament Location: Bilateral plantar fascia at the glabrous junction; medial approach. Skin Prep: alcohol Injectate: 0.5 cc 0.5% marcaine plain, 0.5 cc of 1% Lidocaine, 0.5 cc kenalog 10. Disposition: Patient tolerated procedure well. Injection site dressed with a band-aid.  No follow-ups on file.

## 2019-09-10 ENCOUNTER — Telehealth: Payer: Self-pay | Admitting: *Deleted

## 2019-09-10 NOTE — Telephone Encounter (Signed)
Pt asked if she could get a copy of the plantar fasciitis exercises.

## 2019-09-12 NOTE — Telephone Encounter (Signed)
Hey Darrick, can you take care of this for the patient. Thanks Lattie Haw

## 2019-09-13 ENCOUNTER — Telehealth: Payer: Self-pay

## 2019-09-13 NOTE — Telephone Encounter (Signed)
error 

## 2019-10-01 IMAGING — MG DIGITAL SCREENING BILATERAL MAMMOGRAM WITH TOMO AND CAD
8 series · 8 of 24 positions shown · non-contrast
Comparison: Previous exam(s).

CLINICAL DATA: Screening.

EXAM:
DIGITAL SCREENING BILATERAL MAMMOGRAM WITH TOMO AND CAD

[L MLO synth-2D]
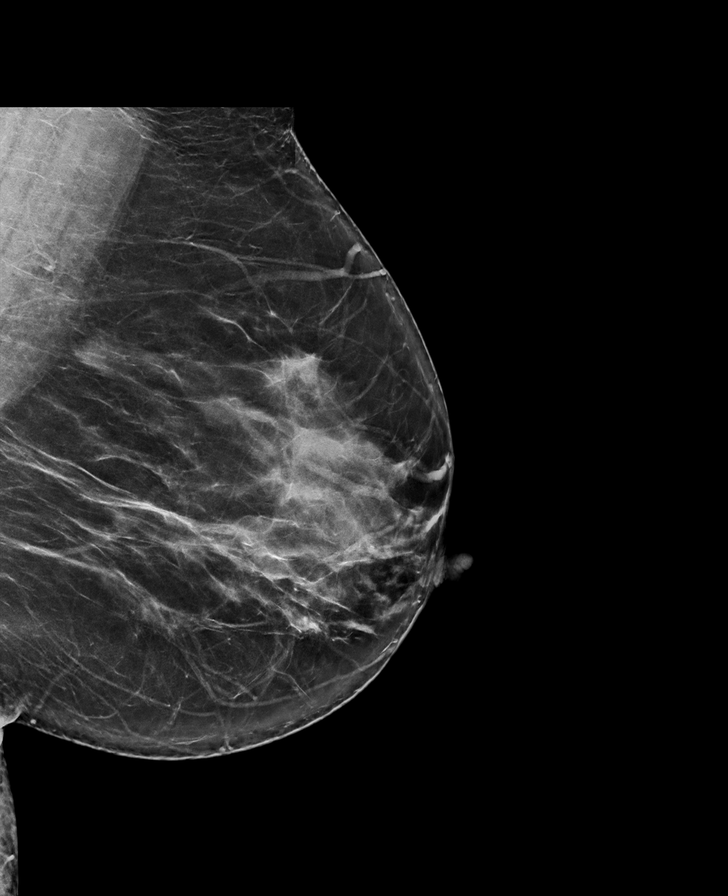

[R CC synth-2D]
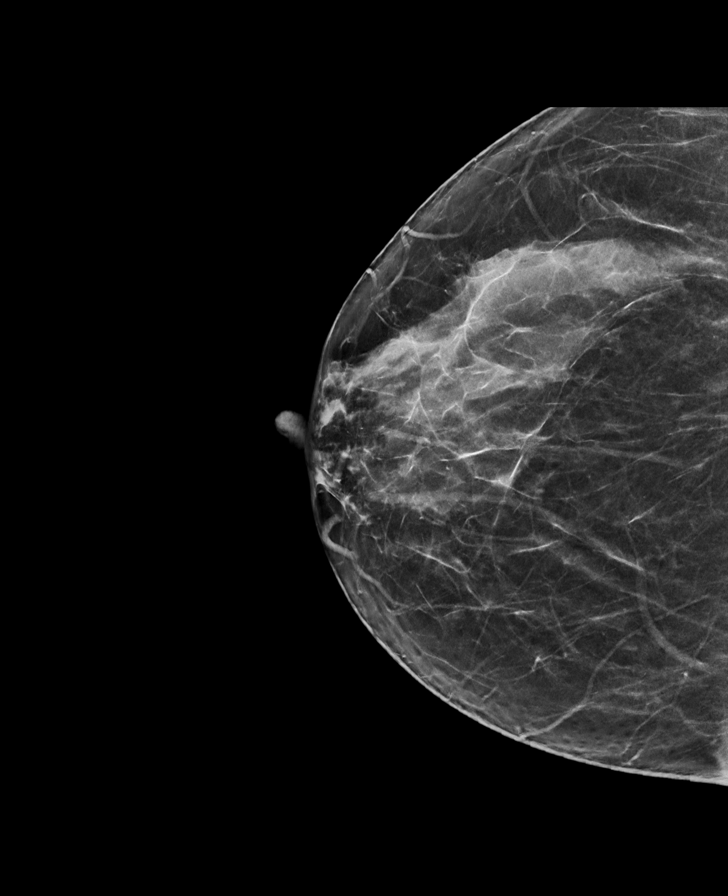

[R MLO synth-2D]
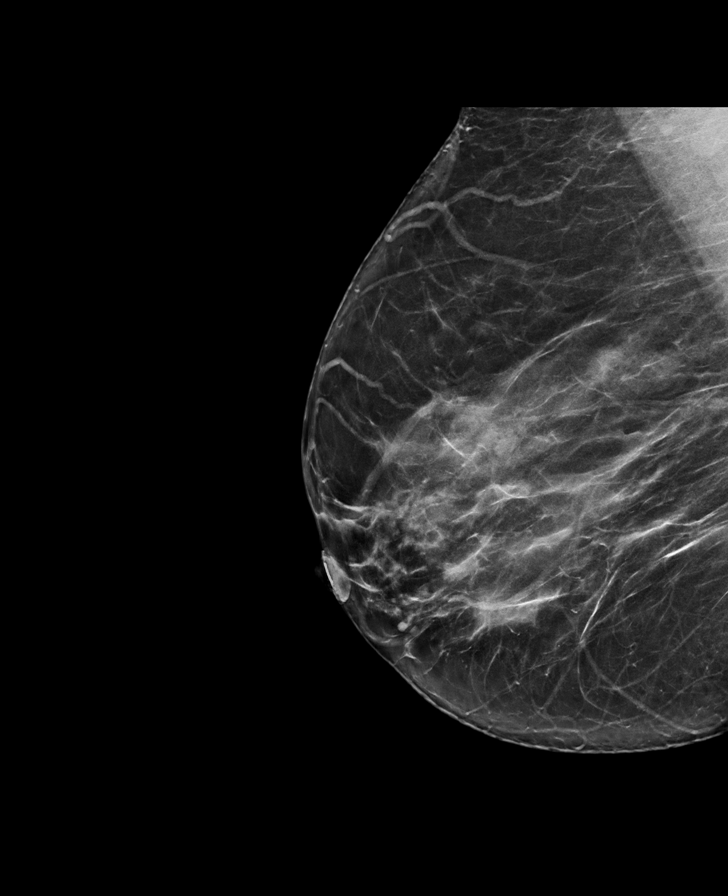

[L CC synth-2D]
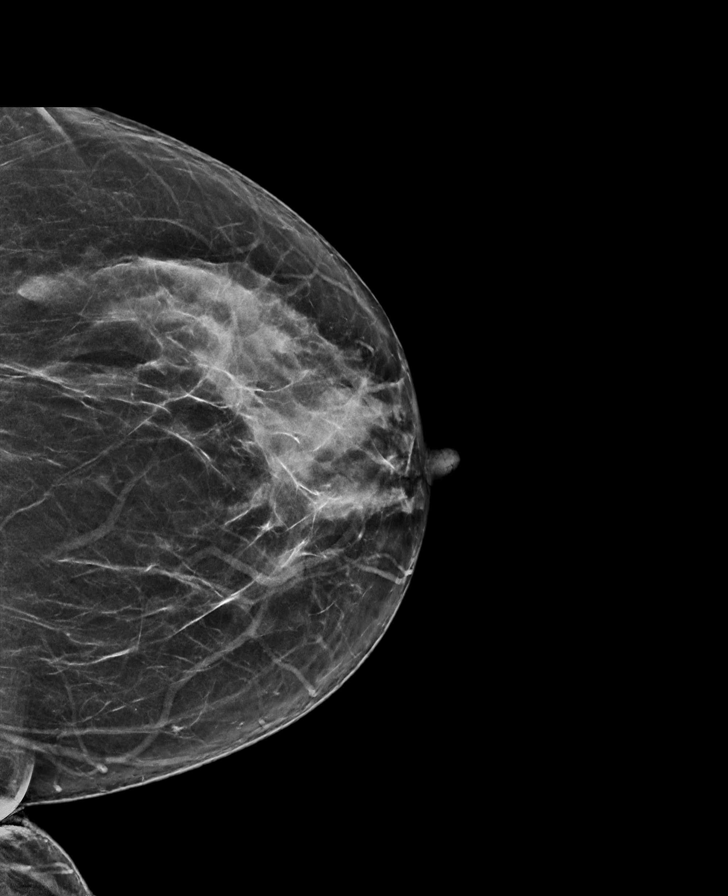

[R CC tomo · tomo slice 37/74.0]
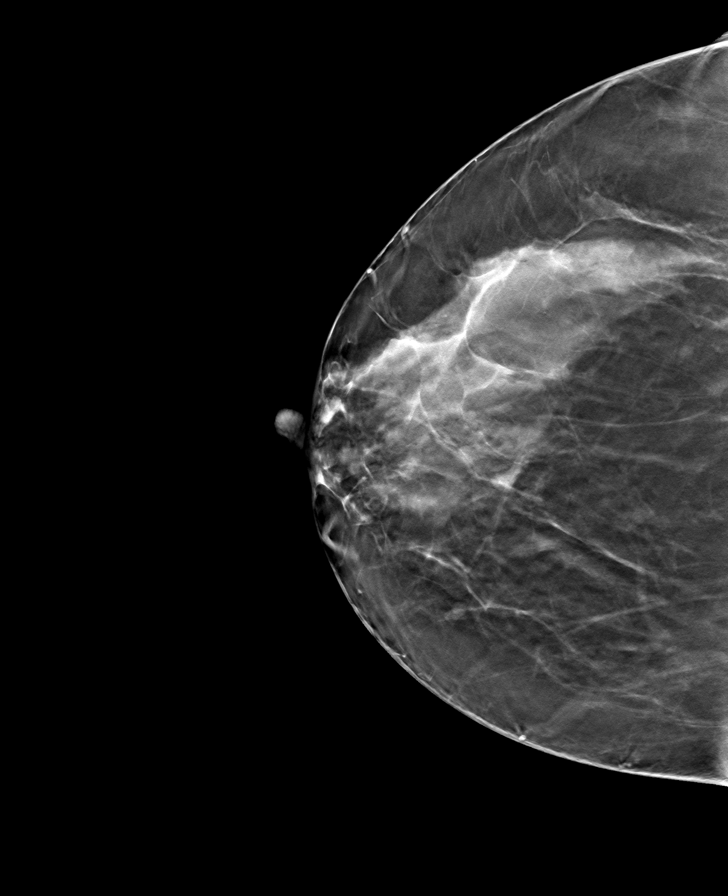

[L CC tomo · tomo slice 37/74.0]
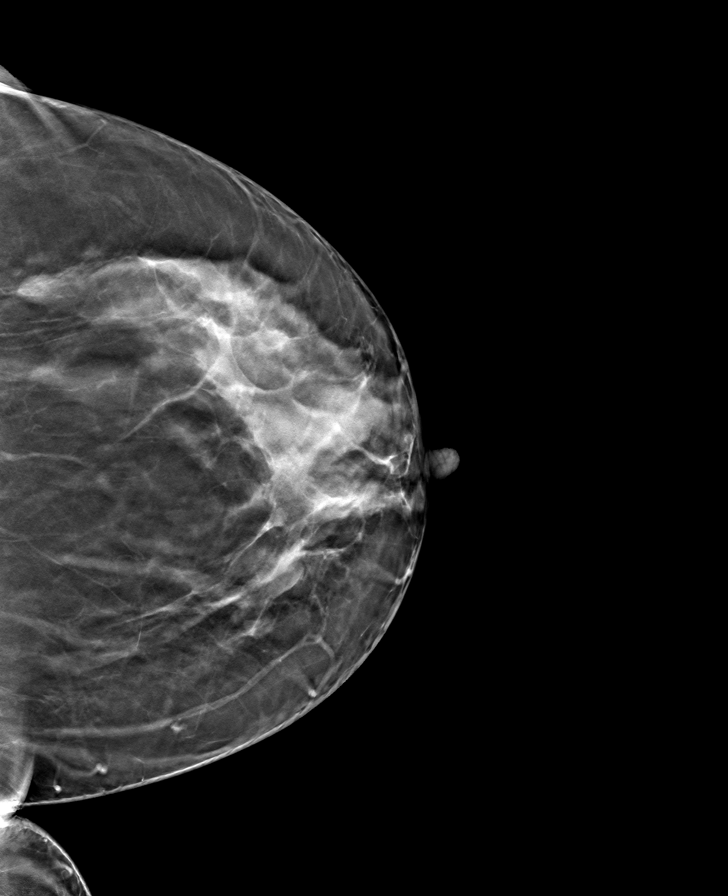

[L MLO tomo · tomo slice 41/82.0]
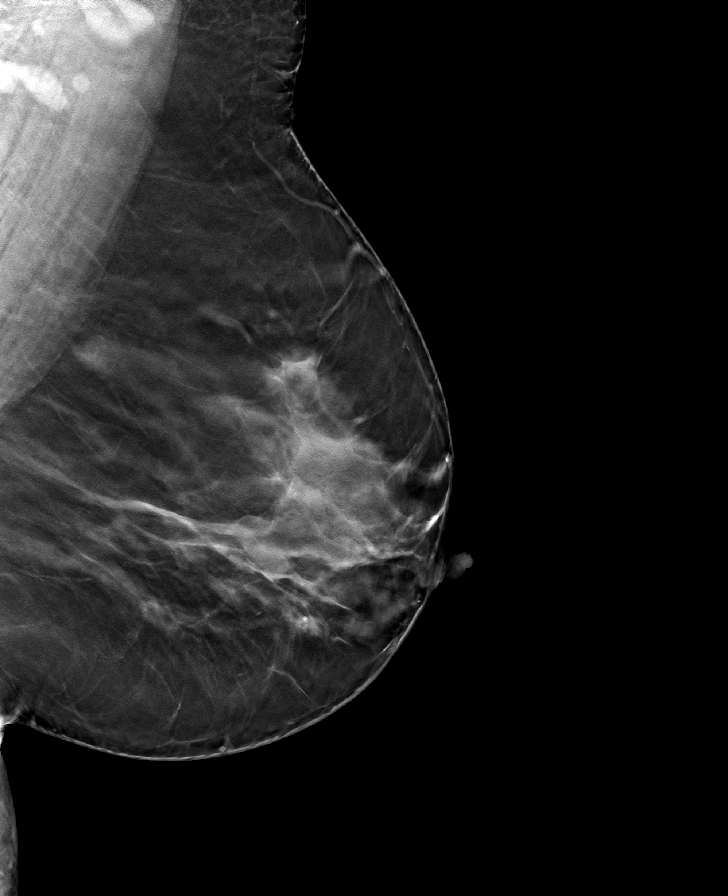

[R MLO tomo · tomo slice 40/79.0]
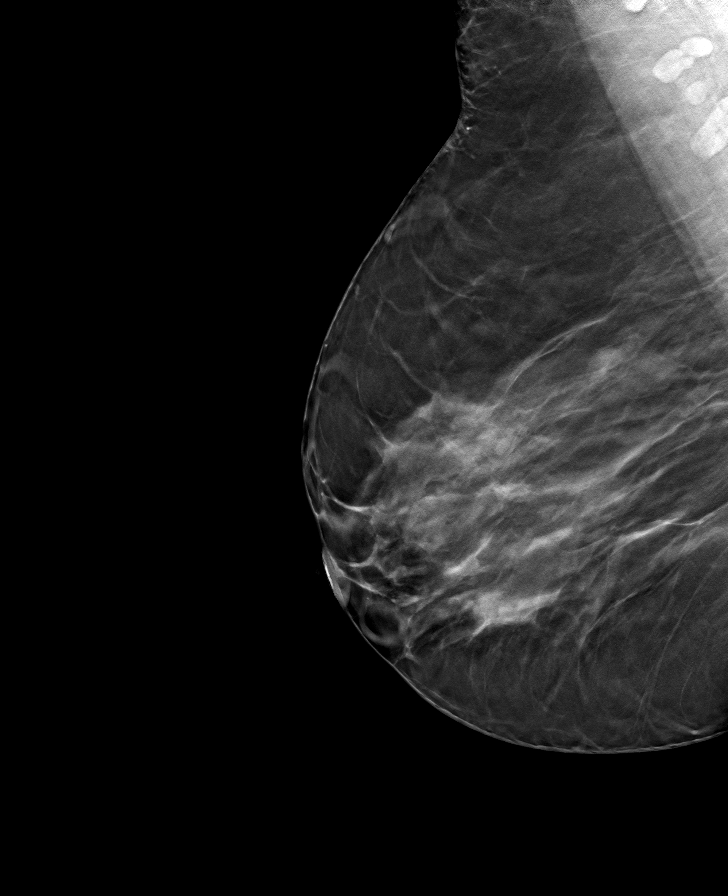

[8 of 24 positions shown; findings below may reference images not displayed]

ACR Breast Density Category b: There are scattered areas of
fibroglandular density.
FINDINGS: There are no findings suspicious for malignancy. Images were
processed with CAD.
IMPRESSION: No mammographic evidence of malignancy. A result letter of this
screening mammogram will be mailed directly to the patient.

RECOMMENDATION:
Screening mammogram in one year. (Code:[id number])

BI-RADS CATEGORY  1: Negative.

## 2019-10-09 ENCOUNTER — Ambulatory Visit (INDEPENDENT_AMBULATORY_CARE_PROVIDER_SITE_OTHER): Payer: 59 | Admitting: Podiatry

## 2019-10-09 ENCOUNTER — Other Ambulatory Visit: Payer: Self-pay

## 2019-10-09 DIAGNOSIS — M7752 Other enthesopathy of left foot: Secondary | ICD-10-CM

## 2019-10-09 DIAGNOSIS — M722 Plantar fascial fibromatosis: Secondary | ICD-10-CM

## 2019-10-09 DIAGNOSIS — M79672 Pain in left foot: Secondary | ICD-10-CM

## 2019-10-09 DIAGNOSIS — M2141 Flat foot [pes planus] (acquired), right foot: Secondary | ICD-10-CM | POA: Diagnosis not present

## 2019-10-09 DIAGNOSIS — M2142 Flat foot [pes planus] (acquired), left foot: Secondary | ICD-10-CM | POA: Diagnosis not present

## 2019-10-09 DIAGNOSIS — M79671 Pain in right foot: Secondary | ICD-10-CM | POA: Diagnosis not present

## 2019-10-09 NOTE — Patient Instructions (Signed)

## 2019-10-10 ENCOUNTER — Encounter: Payer: Self-pay | Admitting: Podiatry

## 2019-10-10 NOTE — Progress Notes (Signed)
Subjective:  Patient ID: Stephanie Mcconnell, female    DOB: 12-Oct-1970,  MRN: [id number]  Chief Complaint  Patient presents with  . Foot Pain    pt is here for a f/u of plantar fasciitis of both feet, pt states that she is still in pain since the last time she was here, pt also states that the injection she recieved last time has helped tremendously, pt is also unsure of if the plantar fascial braces is helping    49 y.o. female presents with the above complaint.  Patient here is a follow-up for bilateral plantar fasciitis.  Patient states that the pain is still present she states that there injection she received did help but is still causing her some pain afterward.  Patient is unsure if the plantar fascial brace is helping but she stated it is definitely hurting her.  The patient has finished all the medication she received.  Patient is also looking to get another copy of the stretching exercises.  Her pain scale is 5 out of 10 today.  She also has a secondary complaint of left ankle pain that came out of nowhere.  This likely is due to compensation from changes in the gait mechanics from plantar fasciitis.  However, I will further evaluate it.  She denies any other acute complaints.  Review of Systems: Negative except as noted in the HPI. Denies N/V/F/Ch.  Past Medical History:  Diagnosis Date  . Allergy   . Bulging disc    lower back.   . 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 Urticaria     Current Outpatient Medications:  .  albuterol (VENTOLIN HFA) 108 (90 Base) MCG/ACT inhaler, 2 puffs every 4 hours as needed for wheezing or coughing spells. May use 5 to 15 minutes before exercise., Disp: 1 Inhaler, Rfl: 2 .  Ascorbic Acid (VITAMIN C) 100 MG tablet, Take 100 mg by mouth daily., Disp: , Rfl:  .  azelastine (ASTELIN) 0.1 % nasal spray, Place 1 spray into both nostrils 2 (two) times daily. Use in each nostril as directed, Disp: 30 mL, Rfl: 12 .   cyclobenzaprine (FLEXERIL) 10 MG tablet, Take 1 tablet (10 mg total) by mouth 3 (three) times daily as needed for muscle spasms. (Patient not taking: Reported on 05/09/2019), Disp: 20 tablet, Rfl: 0 .  fluticasone (FLONASE) 50 MCG/ACT nasal spray, Place 2 sprays into both nostrils daily. (Patient taking differently: Place 2 sprays into both nostrils daily as needed. ), Disp: 16 g, Rfl: 6 .  levocetirizine (XYZAL) 5 MG tablet, Take 1 tablet (5 mg total) by mouth every evening., Disp: 30 tablet, Rfl: 5 .  megestrol (MEGACE) 40 MG tablet, Take 1 tablet (40 mg total) by mouth daily. Can increase to two tablets twice a day in the event of heavy bleeding (Patient not taking: Reported on 05/09/2019), Disp: 30 tablet, Rfl: 5 .  methylPREDNISolone (MEDROL DOSEPAK) 4 MG TBPK tablet, Use as directed, Disp: 1 each, Rfl: 0 .  montelukast (SINGULAIR) 10 MG tablet, Take 1 tablet (10 mg total) by mouth at bedtime., Disp: 30 tablet, Rfl: 3 .  Multiple Vitamin (MULTIVITAMIN) capsule, Take 1 capsule by mouth daily., Disp: , Rfl:  .  Zinc 50 MG TABS, Take by mouth., Disp: , Rfl:   Social History   Tobacco Use  Smoking Status Never Smoker  Smokeless Tobacco Never Used    No Known Allergies Objective:   There were no  vitals filed for this visit. There is no height or weight on file to calculate BMI. Constitutional Well developed. Well nourished.  Vascular Dorsalis pedis pulses palpable bilaterally. Posterior tibial pulses palpable bilaterally. Capillary refill normal to all digits.  No cyanosis or clubbing noted. Pedal hair growth normal.  Neurologic Normal speech. Oriented to person, place, and time. Epicritic sensation to light touch grossly present bilaterally.  Dermatologic Nails well groomed and normal in appearance. No open wounds. No skin lesions.  Orthopedic: Normal joint ROM without pain or crepitus bilaterally. No visible deformities. Tender to palpation at the calcaneal tuber bilaterally. No  pain with calcaneal squeeze bilaterally. Ankle ROM full range of motion bilaterally. Silfverskiold Test: negative bilaterally.  Pain on palpation to the left lateral ankle gutter.  Pain with range of motion of the left lateral ankle both in active and passive manner.  No pain at the ATFL, peroneal tendon, foot posterior tendon, Achilles tendon.   Radiographs: Taken and reviewed.  None    Assessment:   1. Plantar fasciitis of right foot   2. Plantar fasciitis of left foot   3. Pain in right foot   4. Pain in left foot   5. Capsulitis of left ankle   6. Pes planus of both feet    Plan:  Patient was evaluated and treated and all questions answered.  Plantar Fasciitis, bilaterally -X-rays not taken during this visit - re-Educated on icing and stretching. Instructions given.  - Injection delivered to the plantar fascia as below. - DME: Night splint x2 dispensed - Pharmacologic management: None  Left ankle pain/capsulitis -I explained to the patient the etiology of this left ankle pain/capsulitis.  I believe this is likely a compensate secondary to mechanism from changes in the gait mechanics due to plantar fasciitis.  Patient states understanding.  However given the amount of pain that she is having I believe she will benefit from steroid injection at this time.  Patient agrees with this plan. -A steroid injection was performed at left ankle joint using 1% plain Lidocaine and 10 mg of Kenalog. This was well tolerated.  Semiflexible pes planus deformity -I explained to the patient the etiology of pes planus deformity and various treatment options associated with it.  I believe patient will benefit from custom-made orthotics to help support the arches of the foot as well as address the plantar fasciitis and control the hindfoot motion. -Patient will be scheduled to see [facility] for custom-made orthotics.  Procedure: Injection Tendon/Ligament Location: Bilateral plantar fascia at the glabrous  junction; medial approach. Skin Prep: alcohol Injectate: 0.5 cc 0.5% marcaine plain, 0.5 cc of 1% Lidocaine, 0.5 cc kenalog 10. Disposition: Patient tolerated procedure well. Injection site dressed with a band-aid.  Return in about 4 weeks (around 11/06/2019) for Plantar fasciitis, Sched with Liliane Channel for [employer].

## 2019-10-15 ENCOUNTER — Ambulatory Visit (INDEPENDENT_AMBULATORY_CARE_PROVIDER_SITE_OTHER): Payer: 59 | Admitting: Orthotics

## 2019-10-15 ENCOUNTER — Other Ambulatory Visit: Payer: Self-pay

## 2019-10-15 DIAGNOSIS — M722 Plantar fascial fibromatosis: Secondary | ICD-10-CM

## 2019-10-15 DIAGNOSIS — M79671 Pain in right foot: Secondary | ICD-10-CM

## 2019-10-15 NOTE — Progress Notes (Signed)

## 2019-11-06 ENCOUNTER — Ambulatory Visit: Payer: No Typology Code available for payment source | Admitting: Podiatry

## 2019-11-07 ENCOUNTER — Encounter: Payer: No Typology Code available for payment source | Admitting: Orthotics

## 2019-11-08 ENCOUNTER — Ambulatory Visit: Payer: No Typology Code available for payment source | Admitting: Podiatry

## 2019-11-08 ENCOUNTER — Other Ambulatory Visit: Payer: Self-pay

## 2019-11-08 ENCOUNTER — Encounter: Payer: Self-pay | Admitting: Podiatry

## 2019-11-08 DIAGNOSIS — M79671 Pain in right foot: Secondary | ICD-10-CM

## 2019-11-08 DIAGNOSIS — M7730 Calcaneal spur, unspecified foot: Secondary | ICD-10-CM | POA: Diagnosis not present

## 2019-11-08 DIAGNOSIS — M722 Plantar fascial fibromatosis: Secondary | ICD-10-CM

## 2019-11-08 DIAGNOSIS — M76821 Posterior tibial tendinitis, right leg: Secondary | ICD-10-CM

## 2019-11-08 NOTE — Patient Instructions (Signed)

## 2019-11-12 ENCOUNTER — Encounter: Payer: Self-pay | Admitting: Podiatry

## 2019-11-12 NOTE — Progress Notes (Signed)
Subjective:  Patient ID: Stephanie Mcconnell, female    DOB: 1970/12/01,  MRN: [id number]  Chief Complaint  Patient presents with  . Foot Pain    pt is here for a f/u of plantar fasciitis of the right foot, pt states that pain is elevated when she is walking on it. Pt's orthotics has come in as well.    49 y.o. female presents with the above complaint.  Patient here is a follow-up for bilateral plantar fasciitis.  Patient states that the pain is still present she states that there injection she received did help but is still causing her some pain afterward.  Patient is unsure if the plantar fascial brace is helping but she stated it is definitely hurting her.  The patient has finished all the medication she received.  Patient is also looking to get another copy of the stretching exercises.  Her pain scale is 5 out of 10 today.  She also has a secondary complaint of left ankle pain that came out of nowhere.  This likely is due to compensation from changes in the gait mechanics from plantar fasciitis.  However, I will further evaluate it.  She denies any other acute complaints.  Review of Systems: Negative except as noted in the HPI. Denies N/V/F/Ch.  Past Medical History:  Diagnosis Date  . Allergy   . Bulging disc    lower back.   . 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 Urticaria     Current Outpatient Medications:  .  albuterol (VENTOLIN HFA) 108 (90 Base) MCG/ACT inhaler, 2 puffs every 4 hours as needed for wheezing or coughing spells. May use 5 to 15 minutes before exercise., Disp: 1 Inhaler, Rfl: 2 .  Ascorbic Acid (VITAMIN C) 100 MG tablet, Take 100 mg by mouth daily., Disp: , Rfl:  .  azelastine (ASTELIN) 0.1 % nasal spray, Place 1 spray into both nostrils 2 (two) times daily. Use in each nostril as directed, Disp: 30 mL, Rfl: 12 .  fluticasone (FLONASE) 50 MCG/ACT nasal spray, Place 2 sprays into both nostrils daily. (Patient taking differently: Place 2  sprays into both nostrils daily as needed. ), Disp: 16 g, Rfl: 6 .  levocetirizine (XYZAL) 5 MG tablet, Take 1 tablet (5 mg total) by mouth every evening., Disp: 30 tablet, Rfl: 5 .  methylPREDNISolone (MEDROL DOSEPAK) 4 MG TBPK tablet, Use as directed, Disp: 1 each, Rfl: 0 .  montelukast (SINGULAIR) 10 MG tablet, Take 1 tablet (10 mg total) by mouth at bedtime., Disp: 30 tablet, Rfl: 3 .  Multiple Vitamin (MULTIVITAMIN) capsule, Take 1 capsule by mouth daily., Disp: , Rfl:  .  Zinc 50 MG TABS, Take by mouth., Disp: , Rfl:  .  cyclobenzaprine (FLEXERIL) 10 MG tablet, Take 1 tablet (10 mg total) by mouth 3 (three) times daily as needed for muscle spasms. (Patient not taking: Reported on 05/09/2019), Disp: 20 tablet, Rfl: 0 .  megestrol (MEGACE) 40 MG tablet, Take 1 tablet (40 mg total) by mouth daily. Can increase to two tablets twice a day in the event of heavy bleeding (Patient not taking: Reported on 05/09/2019), Disp: 30 tablet, Rfl: 5  Social History   Tobacco Use  Smoking Status Never Smoker  Smokeless Tobacco Never Used    No Known Allergies Objective:   There were no vitals filed for this visit. There is no height or weight on file to calculate BMI. Constitutional Well developed. Well  nourished.  Vascular Dorsalis pedis pulses palpable bilaterally. Posterior tibial pulses palpable bilaterally. Capillary refill normal to all digits.  No cyanosis or clubbing noted. Pedal hair growth normal.  Neurologic Normal speech. Oriented to person, place, and time. Epicritic sensation to light touch grossly present bilaterally.  Dermatologic Nails well groomed and normal in appearance. No open wounds. No skin lesions.  Heel spur orthopedic: Normal joint ROM without pain or crepitus bilaterally. No visible deformities. Tender to palpation at the calcaneal tuber bilaterally. No pain with calcaneal squeeze bilaterally. Ankle ROM full range of motion bilaterally. Silfverskiold Test: negative  bilaterally.  Pain on palpation of the right posterior tibial tendon along the course of the tendon itself especially retromalleoli.  No pain at the insertion.  No pain anywhere around the ankle joint.  No pain on palpation to the left lateral ankle gutter.  No pain with range of motion of the left lateral ankle both in active and passive manner.  No pain at the ATFL, peroneal tendon, foot posterior tendon, Achilles tendon.   Radiographs: Taken and reviewed.  None    Assessment:   1. Plantar fasciitis of right foot   2. Plantar fasciitis of left foot   3. Pain in right foot   4. Posterior tibial tendinitis of right lower extremity    Plan:  Patient was evaluated and treated and all questions answered.  Right posterior tibial tendinitis -I explained to the patient the etiology of posterior tibial tendinitis and various treatment options associated with it including orthotics management.  Given that patient had just obtain orthotics I believe patient will benefit from long-term management and control of this tendinitis with orthotics.  I also believe patient will benefit from steroid injection to help decrease the acute inflammatory component surrounding the tendon.  I also explained to the patient the high risk of rupture with steroid medication.  Patient would like to proceed with injection despite the risks. -A steroid injection was performed at right medial foot at the point of maximal tenderness using 1% plain Lidocaine and 10 mg of Kenalog. This was well tolerated.   Plantar Fasciitis left -I believe patient will benefit from continuous use from orthotics that she just obtained to help decrease the pain to the left plantar fasciitis.  I explained to the patient to continue with stretching exercises and wearing the orthotics as they are long-term management of plantar fasciitis. -Her pain has considerably improved since the last I saw her for plantar fasciitis but is still not quite  improving.  If she has a hard time getting the inflammation decreased I will place her in a cam boot next time for the left side  Right plantar fasciitis -Completely resolved with conservative management  Left ankle pain/capsulitis -Left ankle pain/capsulitis that is completely resolved with a steroid injection.  No further treatment for this ankle at this time.  Semiflexible pes planus deformity -Patient picked up her orthotics.  I explained to the patient the importance of breaking in.  With gradual increase in activities with the orthotics.  Patient states understanding will continue doing breaking them to get fully accommodated.  No follow-ups on file.

## 2019-12-20 ENCOUNTER — Other Ambulatory Visit: Payer: Self-pay

## 2019-12-20 ENCOUNTER — Ambulatory Visit: Payer: 59 | Admitting: Podiatry

## 2019-12-20 DIAGNOSIS — M722 Plantar fascial fibromatosis: Secondary | ICD-10-CM

## 2019-12-20 DIAGNOSIS — M76821 Posterior tibial tendinitis, right leg: Secondary | ICD-10-CM

## 2019-12-20 DIAGNOSIS — M79672 Pain in left foot: Secondary | ICD-10-CM

## 2019-12-20 DIAGNOSIS — M79671 Pain in right foot: Secondary | ICD-10-CM | POA: Diagnosis not present

## 2019-12-23 ENCOUNTER — Encounter: Payer: Self-pay | Admitting: Podiatry

## 2019-12-23 NOTE — Progress Notes (Signed)
Subjective:  Patient ID: Stephanie Mcconnell, female    DOB: Sep 03, 1971,  MRN: [id number]  Chief Complaint  Patient presents with  . Plantar Fasciitis    pt is here for a f/u of plantar fasciitis of the right foot, pt states that she is doing alot better, pt states that she has minimal pain, and that the pain will often vary. Pt puts pain scale as a 7 out of 38.    49 y.o. female presents with the above complaint.  Patient presents with a follow-up of bilateral plantar fasciitis.  She states that she does not have any more pain.  She states that all the treatment plan so far has helped a lot better.  Overall she is doing much better.  She does not have muscle spasm.  She states occasionally hurts when she is been working for long period of time on the floor otherwise she is doing much better since the first time she came to see me.  She has obtained orthotics and has been wearing them with the breaking in..  She denies any other acute complaints  Review of Systems: Negative except as noted in the HPI. Denies N/V/F/Ch.  Past Medical History:  Diagnosis Date  . Allergy   . Bulging disc    lower back.   . 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 Urticaria     Current Outpatient Medications:  .  albuterol (VENTOLIN HFA) 108 (90 Base) MCG/ACT inhaler, 2 puffs every 4 hours as needed for wheezing or coughing spells. May use 5 to 15 minutes before exercise., Disp: 1 Inhaler, Rfl: 2 .  Ascorbic Acid (VITAMIN C) 100 MG tablet, Take 100 mg by mouth daily., Disp: , Rfl:  .  azelastine (ASTELIN) 0.1 % nasal spray, Place 1 spray into both nostrils 2 (two) times daily. Use in each nostril as directed, Disp: 30 mL, Rfl: 12 .  fluticasone (FLONASE) 50 MCG/ACT nasal spray, Place 2 sprays into both nostrils daily. (Patient taking differently: Place 2 sprays into both nostrils daily as needed. ), Disp: 16 g, Rfl: 6 .  levocetirizine (XYZAL) 5 MG tablet, Take 1 tablet (5 mg total) by  mouth every evening., Disp: 30 tablet, Rfl: 5 .  methylPREDNISolone (MEDROL DOSEPAK) 4 MG TBPK tablet, Use as directed, Disp: 1 each, Rfl: 0 .  montelukast (SINGULAIR) 10 MG tablet, Take 1 tablet (10 mg total) by mouth at bedtime., Disp: 30 tablet, Rfl: 3 .  Multiple Vitamin (MULTIVITAMIN) capsule, Take 1 capsule by mouth daily., Disp: , Rfl:  .  Zinc 50 MG TABS, Take by mouth., Disp: , Rfl:  .  cyclobenzaprine (FLEXERIL) 10 MG tablet, Take 1 tablet (10 mg total) by mouth 3 (three) times daily as needed for muscle spasms. (Patient not taking: Reported on 05/09/2019), Disp: 20 tablet, Rfl: 0 .  megestrol (MEGACE) 40 MG tablet, Take 1 tablet (40 mg total) by mouth daily. Can increase to two tablets twice a day in the event of heavy bleeding (Patient not taking: Reported on 05/09/2019), Disp: 30 tablet, Rfl: 5  Social History   Tobacco Use  Smoking Status Never Smoker  Smokeless Tobacco Never Used    No Known Allergies Objective:   There were no vitals filed for this visit. There is no height or weight on file to calculate BMI. Constitutional Well developed. Well nourished.  Vascular Dorsalis pedis pulses palpable bilaterally. Posterior tibial pulses palpable bilaterally. Capillary refill normal to  all digits.  No cyanosis or clubbing noted. Pedal hair growth normal.  Neurologic Normal speech. Oriented to person, place, and time. Epicritic sensation to light touch grossly present bilaterally.  Dermatologic Nails well groomed and normal in appearance. No open wounds. No skin lesions.  Heel spur orthopedic: Normal joint ROM without pain or crepitus bilaterally. No visible deformities. Very mild to palpation at the calcaneal tuber bilaterally. No pain with calcaneal squeeze bilaterally. Ankle ROM full range of motion bilaterally. Silfverskiold Test: negative bilaterally.  No pain on palpation of the right posterior tibial tendon along the course of the tendon itself especially  retromalleoli.  No pain at the insertion.  No pain anywhere around the ankle joint.  No pain on palpation to the left lateral ankle gutter.  No pain with range of motion of the left lateral ankle both in active and passive manner.  No pain at the ATFL, peroneal tendon, foot posterior tendon, Achilles tendon.   Radiographs: Taken and reviewed.  None    Assessment:   1. Plantar fasciitis of right foot   2. Plantar fasciitis of left foot   3. Pain in right foot   4. Posterior tibial tendinitis of right lower extremity   5. Pain in left foot    Plan:  Patient was evaluated and treated and all questions answered.  Right posterior tibial tendinitis -Resolved with an injection.  Patient will continue to wear arch support to help support and decrease the stress off of the posterior tibial tendon.   Plantar Fasciitis left -I completely resolved with conservative management.  Right plantar fasciitis -Completely resolved with conservative management  Left ankle pain/capsulitis -Left ankle pain/capsulitis that is completely resolved with a steroid injection.  No further treatment for this ankle at this time.  Semiflexible pes planus deformity -Overall patient is doing well with orthotics.  She denies any other acute complaints about the orthotics.    No follow-ups on file.

## 2020-02-25 ENCOUNTER — Other Ambulatory Visit: Payer: Self-pay | Admitting: Family Medicine

## 2020-02-25 DIAGNOSIS — Z0000 Encounter for general adult medical examination without abnormal findings: Secondary | ICD-10-CM | POA: Diagnosis not present

## 2020-02-25 DIAGNOSIS — Z13220 Encounter for screening for lipoid disorders: Secondary | ICD-10-CM | POA: Diagnosis not present

## 2020-02-25 DIAGNOSIS — Z1231 Encounter for screening mammogram for malignant neoplasm of breast: Secondary | ICD-10-CM

## 2020-02-25 DIAGNOSIS — Z23 Encounter for immunization: Secondary | ICD-10-CM | POA: Diagnosis not present

## 2020-03-20 ENCOUNTER — Ambulatory Visit
Admission: RE | Admit: 2020-03-20 | Discharge: 2020-03-20 | Disposition: A | Discharge status: Home / Self Care | Payer: 59 | Source: Ambulatory Visit | Attending: Family Medicine | Admitting: Family Medicine

## 2020-03-20 ENCOUNTER — Other Ambulatory Visit: Payer: Self-pay

## 2020-03-20 DIAGNOSIS — Z1231 Encounter for screening mammogram for malignant neoplasm of breast: Secondary | ICD-10-CM

## 2020-04-23 MED FILL — FLUTICASONE PROP 50 MCG SPR: 50 | 30 days supply | Qty: 16 | Fill #1

## 2020-04-23 MED FILL — AZELASTINE HCL 137 MCG/SPRA: 137 | 50 days supply | Qty: 30 | Fill #1

## 2020-04-23 MED FILL — LEVOCETIRIZINE 5 MG TABLET: 5 | 30 days supply | Qty: 30 | Fill #1

## 2020-04-23 MED FILL — MONTELUKAST SOD 10 MG TAB: 10 | 30 days supply | Qty: 30 | Fill #1

## 2020-07-15 DIAGNOSIS — H524 Presbyopia: Secondary | ICD-10-CM | POA: Diagnosis not present

## 2020-08-25 ENCOUNTER — Other Ambulatory Visit (HOSPITAL_COMMUNITY): Payer: Self-pay | Admitting: Family Medicine

## 2020-08-25 DIAGNOSIS — J3089 Other allergic rhinitis: Secondary | ICD-10-CM | POA: Diagnosis not present

## 2020-08-25 MED FILL — AZELASTINE HCL 137 MCG/SPRA: 137 | 30 days supply | Qty: 30 | Fill #0

## 2020-08-25 MED FILL — MONTELUKAST SOD 10 MG TAB: 10 | 90 days supply | Qty: 90 | Fill #0

## 2020-08-25 MED FILL — LEVOCETIRIZINE 5 MG TABLET: 5 | 90 days supply | Qty: 90 | Fill #0

## 2020-08-26 IMAGING — US US TRANSVAGINAL NON-OB
1 series · 13 of 25 positions shown · non-contrast
Comparison: None

CLINICAL DATA: Patient with abnormal uterine bleeding.

EXAM:
TRANSABDOMINAL AND TRANSVAGINAL ULTRASOUND OF PELVIS
TECHNIQUE: Both transabdominal and transvaginal ultrasound examinations of the
pelvis were performed. Transabdominal technique was performed for
global imaging of the pelvis including uterus, ovaries, adnexal
regions, and pelvic cul-de-sac. It was necessary to proceed with
endovaginal exam following the transabdominal exam to visualize the
endometrium and adnexal structures.

[Series 1: us transvaginal non-ob · 0.25mm/px · 13 of 106 slices shown]
[im 1/106]
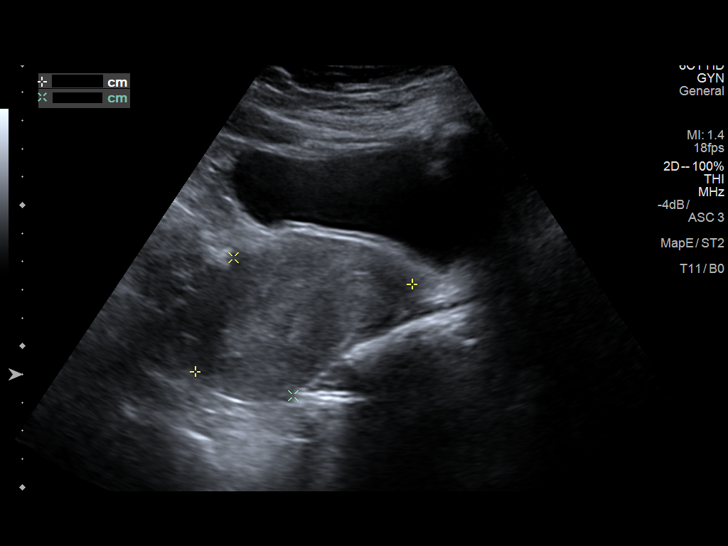
[im 9/106]
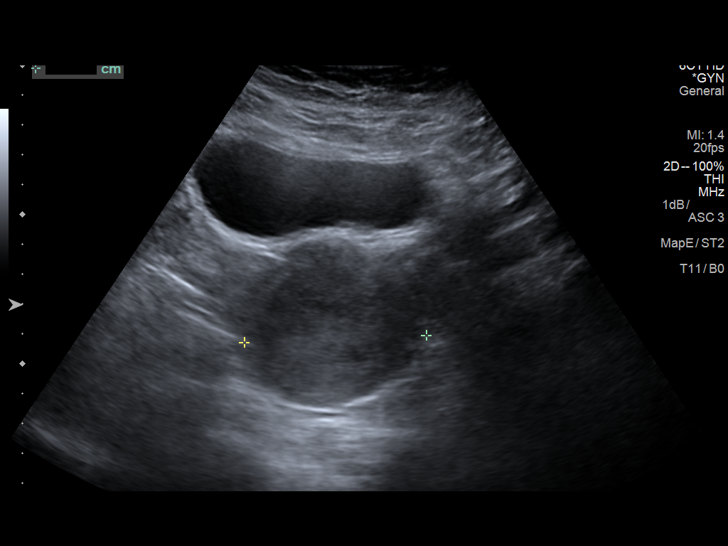
[im 18/106]
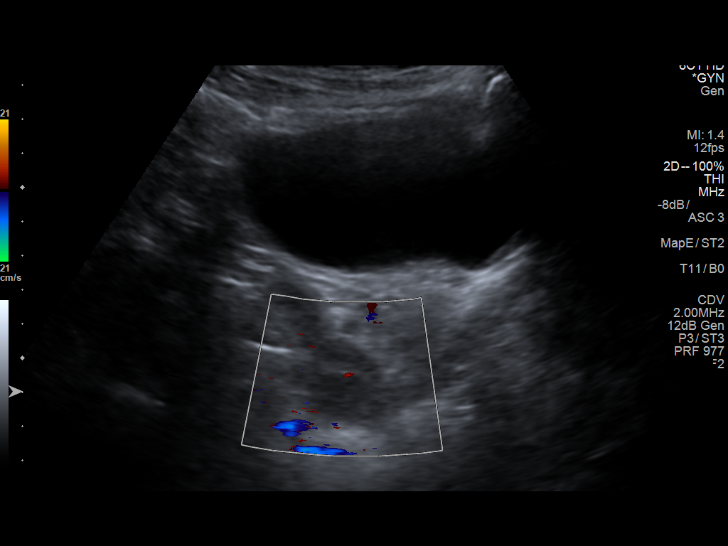
[im 27/106]
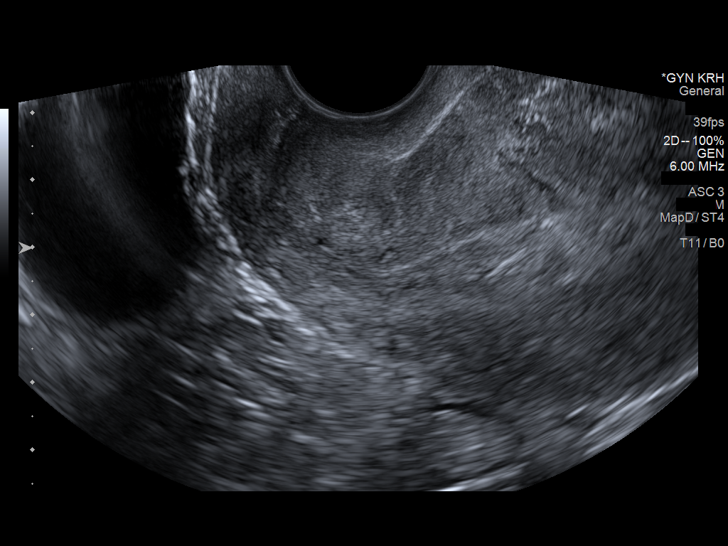
[im 36/106]
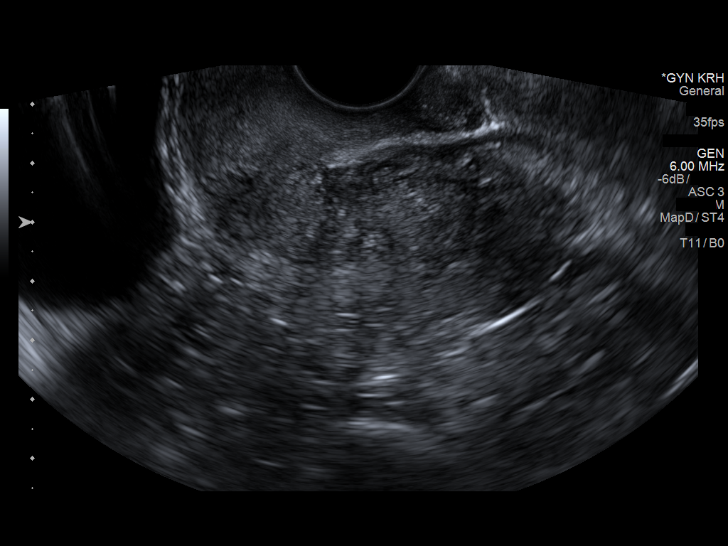
[im 44/106]
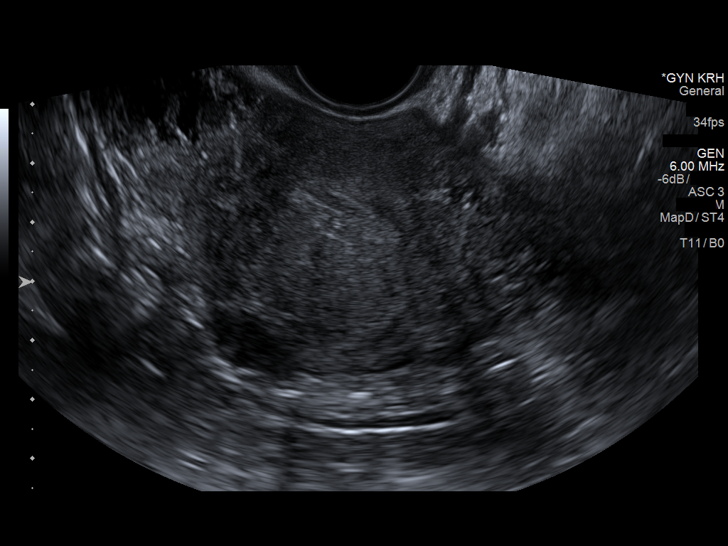
[im 53/106]
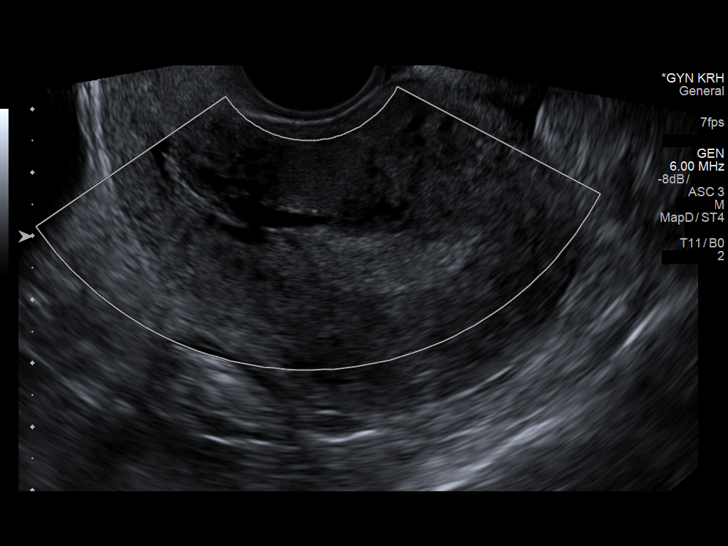
[im 62/106]
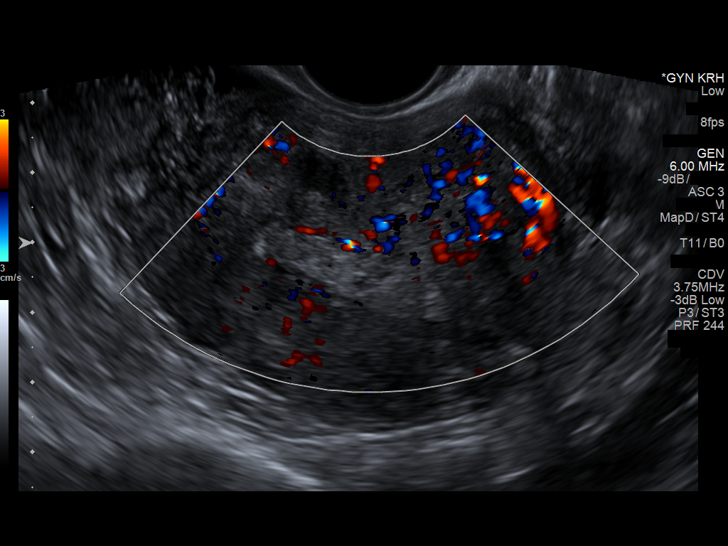
[im 71/106]
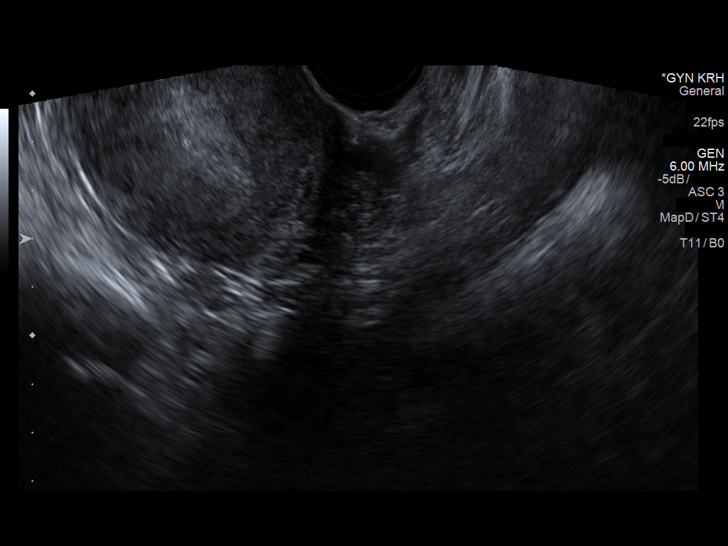
[im 79/106]
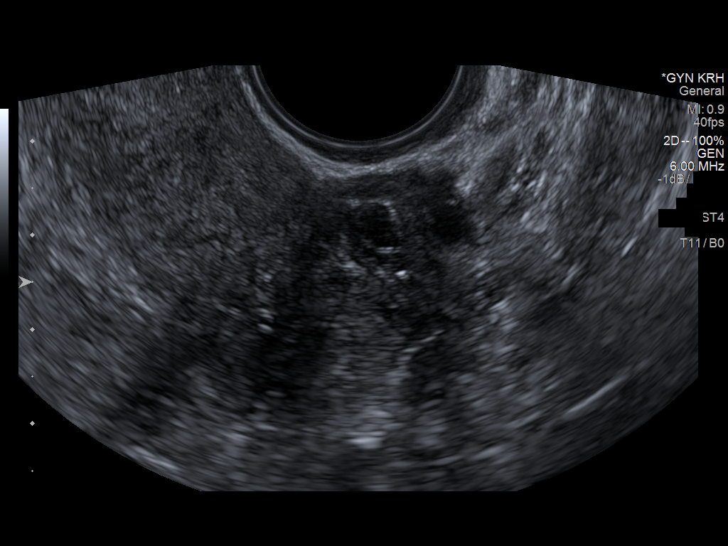
[im 88/106]
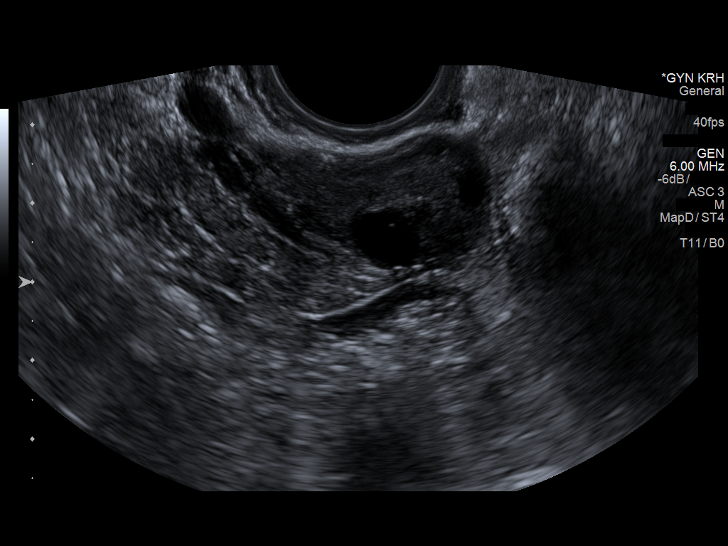
[im 97/106]
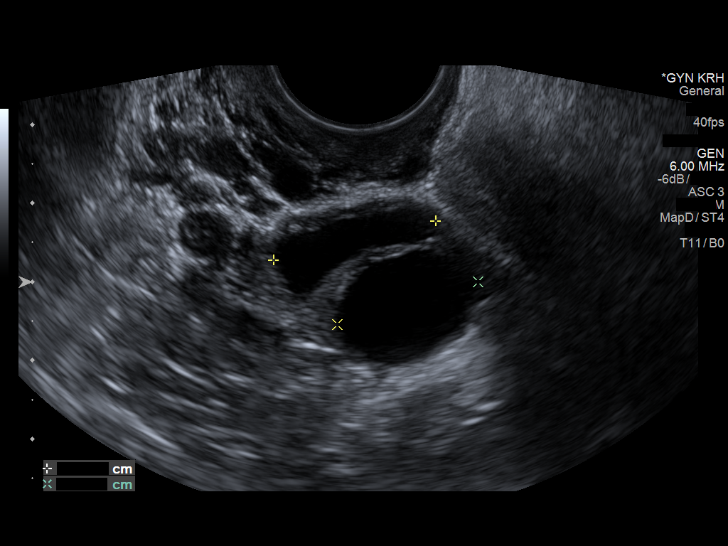
[im 106/106]
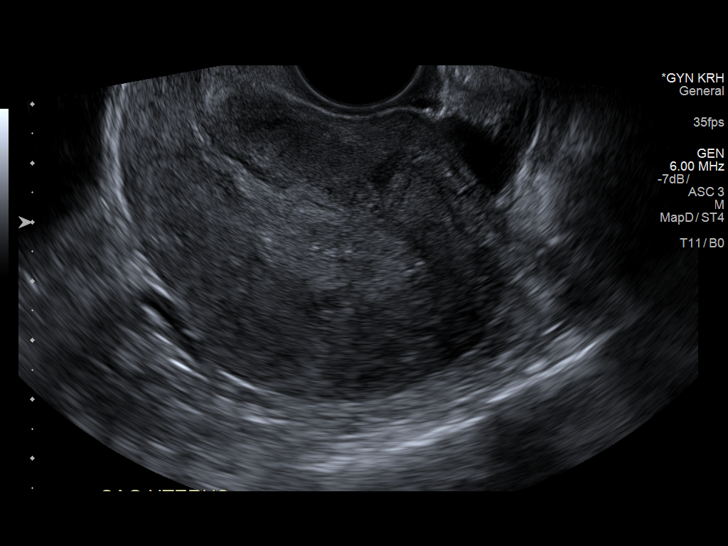

[13 of 25 positions shown; findings below may reference images not displayed]

FINDINGS: Uterus

Measurements: 9.6 x 5.2 x 6.2 cm. Within the anterior uterine body
there is a 1.2 x 0.9 x 1.1 cm subserosal fibroid. Within the
posterior uterine body there is a 0.8 x 0.6 x 0.8 cm intramural
fibroid.

Endometrium

Thickness: 13 mm.  Mildly heterogeneous in echogenicity.

Right ovary

Measurements: 4.2 x 2.3 x 2.9 cm. Normal appearance/no adnexal mass.

Left ovary

Measurements: 1.7 x 2.0 x 1.8 cm. Normal appearance/no adnexal mass.

Other findings

Trace fluid in the pelvis.
IMPRESSION: Endometrium measures 13 mm. If bleeding remains unresponsive to
hormonal or medical therapy, sonohysterogram should be considered
for focal lesion work-up. (Ref: Radiological Reasoning: Algorithmic
Workup of Abnormal Vaginal Bleeding with Endovaginal Sonography and
Sonohysterography. AJR 9557; 191:S68-73)

Probable small fibroids within the uterus.

## 2020-11-02 ENCOUNTER — Other Ambulatory Visit: Payer: Self-pay | Admitting: Family Medicine

## 2020-11-02 DIAGNOSIS — Z1231 Encounter for screening mammogram for malignant neoplasm of breast: Secondary | ICD-10-CM

## 2020-11-03 ENCOUNTER — Other Ambulatory Visit (HOSPITAL_COMMUNITY): Payer: Self-pay | Admitting: Family Medicine

## 2020-11-03 MED FILL — CYCLOBENZAPRINE HCL 10 MG T: 10 | 5 days supply | Qty: 15 | Fill #0

## 2020-11-06 ENCOUNTER — Other Ambulatory Visit (HOSPITAL_COMMUNITY): Payer: Self-pay | Admitting: Orthopedic Surgery

## 2020-11-06 MED FILL — predniSONE 5 MG (21) TBPK: 5 | 6 days supply | Qty: 21 | Fill #0

## 2020-11-23 ENCOUNTER — Ambulatory Visit: Payer: 59 | Admitting: Obstetrics & Gynecology

## 2020-12-07 ENCOUNTER — Other Ambulatory Visit (HOSPITAL_COMMUNITY): Payer: Self-pay | Admitting: Orthopedic Surgery

## 2020-12-07 MED FILL — METHOCARBAMOL 500 MG TABS: 500 | 10 days supply | Qty: 30 | Fill #0

## 2020-12-09 ENCOUNTER — Ambulatory Visit (INDEPENDENT_AMBULATORY_CARE_PROVIDER_SITE_OTHER): Payer: No Typology Code available for payment source | Admitting: Obstetrics & Gynecology

## 2020-12-09 ENCOUNTER — Other Ambulatory Visit: Payer: Self-pay

## 2020-12-09 ENCOUNTER — Other Ambulatory Visit (HOSPITAL_COMMUNITY): Payer: Self-pay | Admitting: Obstetrics & Gynecology

## 2020-12-09 ENCOUNTER — Encounter: Payer: Self-pay | Admitting: Obstetrics & Gynecology

## 2020-12-09 ENCOUNTER — Other Ambulatory Visit (HOSPITAL_COMMUNITY)
Admission: RE | Admit: 2020-12-09 | Discharge: 2020-12-09 | Disposition: A | Discharge status: Home / Self Care | Payer: No Typology Code available for payment source | Source: Ambulatory Visit | Attending: Obstetrics & Gynecology | Admitting: Obstetrics & Gynecology

## 2020-12-09 VITALS — BP 132/71 | HR 84 | Wt 288.0 lb

## 2020-12-09 DIAGNOSIS — Z01419 Encounter for gynecological examination (general) (routine) without abnormal findings: Secondary | ICD-10-CM | POA: Insufficient documentation

## 2020-12-09 DIAGNOSIS — Z1211 Encounter for screening for malignant neoplasm of colon: Secondary | ICD-10-CM | POA: Diagnosis not present

## 2020-12-09 DIAGNOSIS — N939 Abnormal uterine and vaginal bleeding, unspecified: Secondary | ICD-10-CM | POA: Diagnosis present

## 2020-12-09 MED ORDER — NORETHINDRONE ACET-ETHINYL EST 1.5-30 MG-MCG PO TABS: 1.0000 | ORAL_TABLET | Freq: Every day | ORAL | 11 refills | Status: DC

## 2020-12-09 MED FILL — JUNEL 1.5-30 TABLET: 1.5-30 | 63 days supply | Qty: 63 | Fill #0

## 2020-12-09 NOTE — Progress Notes (Signed)
Patient presents for annual exam.  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 Patient states she has had period since the beginning of February. Patient did not have period in January.   Kathrene Alu RN

## 2020-12-09 NOTE — Progress Notes (Signed)
Subjective:     Stephanie Mcconnell is a 50 y.o. female here for a routine exam.  Current complaints: AUB.   Pt reports 'normal' cycles up until Dec. She cycles for Jan was missed. Then since Feb she has bled daily. Sometimes very heavy with clots. Pts 'nomal' cycles last 7+ days with 4-5 heavy days. She now reports hot flushes. The bleeding bothers her more than the hot flushes but, both are bothersome. She was prev on Megace for heavy menses with no relief of the sx. Pt sent a Mychart note in advance of this visit requesting a Endometrial ablation.   Gynecologic History Patient's last menstrual period was 11/09/2020 (approximate). Contraception: ESSURE Last Pap: 03/23/2018. Results were: normal Last mammogram: 03/20/2020. Results were: normal  Obstetric History OB History  Gravida Para Term Preterm AB Living  2 2 2     2   SAB IAB Ectopic Multiple Live Births               # Outcome Date GA Lbr Len/2nd Weight Sex Delivery Anes PTL Lv  2 Term           1 Term              The following portions of the patient's history were reviewed and updated as appropriate: allergies, current medications, past family history, past medical history, past social history, past surgical history and problem list.  Review of Systems Pertinent items are noted in HPI.    Objective:  BP 132/71   Pulse 84   Wt 288 lb (130.6 kg)   LMP 11/09/2020 (Approximate) Comment: Long ongoing period  BMI 47.93 kg/m   General Appearance:    Alert, cooperative, no distress, appears stated age  Head:    Normocephalic, without obvious abnormality, atraumatic  Eyes:    conjunctiva/corneas clear, EOM's intact, both eyes  Ears:    Normal external ear canals, both ears  Nose:   Nares normal, septum midline, mucosa normal, no drainage    or sinus tenderness  Throat:   Lips, mucosa, and tongue normal; teeth and gums normal  Neck:   Supple, symmetrical, trachea midline, no adenopathy;    thyroid:  no  enlargement/tenderness/nodules  Back:     Symmetric, no curvature, ROM normal, no CVA tenderness  Lungs:     respirations unlabored  Chest Wall:    No tenderness or deformity   Heart:    Regular rate and rhythm  Breast Exam:    No tenderness, masses, or nipple abnormality  Abdomen:     Soft, non-tender, bowel sounds active all four quadrants,    no masses, no organomegaly  Genitalia:    Normal female without lesion, discharge or tenderness   Uterus difficult to palpate due to body habitus.   Extremities:   Extremities normal, atraumatic, no cyanosis or edema  Pulses:   2+ and symmetric all extremities  Skin:   Skin color, texture, turgor normal, no rashes or lesions    GYN procedure: The indications for endometrial biopsy were reviewed.   Risks of the biopsy including cramping, bleeding, infection, uterine perforation, inadequate specimen and need for additional procedures  were discussed. The patient states she understands and agrees to undergo procedure today. Consent was signed. Time out was performed. Urine HCG was negative. A sterile speculum was placed in the patient's vagina and the cervix was prepped with Betadine. A single-toothed tenaculum was placed on the anterior lip of the cervix to stabilize it. The 3 mm  pipelle was introduced into the endometrial cavity without difficulty to a depth of 10cm, and a moderate amount of tissue was obtained and sent to pathology. The instruments were removed from the patient's vagina. Minimal bleeding from the cervix was noted. The patient tolerated the procedure well. Routine post-procedure instructions were given to the patient. The patient will follow up to review the results and for further management.     05/31/2018 CLINICAL DATA:  Patient with abnormal uterine bleeding.  EXAM: TRANSABDOMINAL AND TRANSVAGINAL ULTRASOUND OF PELVIS  TECHNIQUE: Both transabdominal and transvaginal ultrasound examinations of the pelvis were performed.  Transabdominal technique was performed for global imaging of the pelvis including uterus, ovaries, adnexal regions, and pelvic cul-de-sac. It was necessary to proceed with endovaginal exam following the transabdominal exam to visualize the endometrium and adnexal structures.  COMPARISON:  None  FINDINGS: Uterus  Measurements: 9.6 x 5.2 x 6.2 cm. Within the anterior uterine body there is a 1.2 x 0.9 x 1.1 cm subserosal fibroid. Within the posterior uterine body there is a 0.8 x 0.6 x 0.8 cm intramural fibroid.  Endometrium  Thickness: 13 mm.  Mildly heterogeneous in echogenicity.  Right ovary  Measurements: 4.2 x 2.3 x 2.9 cm. Normal appearance/no adnexal mass.  Left ovary  Measurements: 1.7 x 2.0 x 1.8 cm. Normal appearance/no adnexal mass.  Other findings  Trace fluid in the pelvis.  IMPRESSION: Endometrium measures 13 mm. If bleeding remains unresponsive to hormonal or medical therapy, sonohysterogram should be considered for focal lesion work-up. (Ref: Radiological Reasoning: Algorithmic Workup of Abnormal Vaginal Bleeding with Endovaginal Sonography and Sonohysterography. AJR 2008; 629:U76-54)  Probable small fibroids within the uterus.  Assessment:    Healthy female exam.   AUB- suspect perimenopause in addition to chronic menorrhagia due to fibroids.  S/p endometrial bx today. D/w pt managemetn options. Pt would like to try meds firstto control bleeding and hot flushes. She was prev on OCPs years ago. No adverse sx. No contraindications.   Breast cancer screen  Colon cancer screen   Plan:     Amaryah was seen today for gynecologic exam.  Diagnoses and all orders for this visit:  Well female exam with routine gynecological exam -     MM DIGITAL SCREENING BILATERAL; Future -     Cytology - PAP( [facility])  Abnormal uterine bleeding (AUB) -     Surgical pathology -     Norethindrone Acetate-Ethinyl Estradiol (LOESTRIN 1.5/30, 21,) 1.5-30  MG-MCG tablet; Take 1 tablet by mouth daily.  Colon cancer screening -     Ambulatory referral to Gastroenterology  f/u in 3 months or sooner prn Will call with results of endom bx  Theodoro Koval L. Harraway-Smith, M.D., [name]

## 2020-12-09 NOTE — Patient Instructions (Signed)
Endometrial Biopsy  An endometrial biopsy is a procedure to remove tissue samples from the endometrium, which is the lining of the uterus. The tissue that is removed can then be checked under a microscope for disease. This procedure is used to diagnose conditions such as endometrial cancer, endometrial tuberculosis, polyps, or other inflammatory conditions. This procedure may also be used to investigate uterine bleeding to determine where you are in your menstrual cycle or how your hormone levels are affecting the lining of the uterus. Tell a health care provider about:  Any allergies you have.  All medicines you are taking, including vitamins, herbs, eye drops, creams, and over-the-counter medicines.  Any problems you or family members have had with anesthetic medicines.  Any blood disorders you have.  Any surgeries you have had.  Any medical conditions you have.  Whether you are pregnant or may be pregnant. What are the risks? Generally, this is a safe procedure. However, problems may occur, including:  Bleeding.  Pelvic infection.  Puncture of the wall of the uterus with the biopsy device (rare).  Allergic reactions to medicines. What happens before the procedure?  Keep a record of your menstrual cycles as told by your health care provider. You may need to schedule your procedure for a specific time in your cycle.  You may want to bring a sanitary pad to wear after the procedure.  Plan to have someone take you home from the hospital or clinic.  Ask your health care provider about: ? Changing or stopping your regular medicines. This is especially important if you are taking diabetes medicines, arthritis medicines, or blood thinners. ? Taking medicines such as aspirin and ibuprofen. These medicines can thin your blood. Do not take these medicines unless your health care provider tells you to take them. ? Taking over-the-counter medicines, vitamins, herbs, and  supplements. What happens during the procedure?  You will lie on an exam table with your feet and legs supported as in a pelvic exam.  Your health care provider will insert an instrument (speculum) into your vagina to see your cervix.  Your cervix will be cleansed with an antiseptic solution.  A medicine (local anesthetic) will be used to numb the cervix.  A forceps instrument (tenaculum) will be used to hold your cervix steady for the biopsy.  A thin, rod-like instrument (uterine sound) will be inserted through your cervix to determine the length of your uterus and the location where the biopsy sample will be removed.  A thin, flexible tube (catheter) will be inserted through your cervix and into the uterus. The catheter will be used to collect the biopsy sample from your endometrial tissue.  The catheter and speculum will then be removed, and the tissue sample will be sent to a lab for examination. The procedure may vary among health care providers and hospitals. What can I expect after procedure?  You will rest in a recovery area until you are ready to go home.  You may have mild cramping and a small amount of vaginal bleeding. This is normal.  You may have a small amount of vaginal bleeding for a few days. This is normal.  It is up to you to get the results of your procedure. Ask your health care provider, or the department that is doing the procedure, when your results will be ready. Follow these instructions at home:  Take over-the-counter and prescription medicines only as told by your health care provider.  Do not douche, use tampons, or have   sexual intercourse until your health care provider approves.  Return to your normal activities as told by your health care provider. Ask your health care provider what activities are safe for you.  Follow instructions from your health care provider about any activity restrictions, such as restrictions on strenuous exercise or heavy  lifting.  Keep all follow-up visits. This is important. Contact a health care provider:  You have heavy bleeding, or bleed for longer than 2 days after the procedure.  You have bad smelling discharge from your vagina.  You have a fever or chills.  You have a burning sensation when urinating or you have difficulty urinating.  You have severe pain in your lower abdomen. Get help right away if you:  You have severe cramps in your stomach or back.  You pass large blood clots.  Your bleeding increases.  You become weak or light-headed, or you faint or lose consciousness. Summary  An endometrial biopsy is a procedure to remove tissue samples is taken from the endometrium, which is the lining of the uterus.  The tissue sample that is removed will be checked under a microscope for disease.  This procedure is used to diagnose conditions such as endometrial cancer, endometrial tuberculosis, polyps, or other inflammatory conditions.  After the procedure, it is common to have mild cramping and a small amount of vaginal bleeding for a few days.  Do not douche, use tampons, or have sexual intercourse until your health care provider approves. Ask your health care provider which activities are safe for you. This information is not intended to replace advice given to you by your health care provider. Make sure you discuss any questions you have with your health care provider. Document Revised: 04/13/2020 Document Reviewed: 04/13/2020 Elsevier Patient Education  2021 [employer] [phone] ed., pp. [phone]). [place], [place]: Elsevier.">  Perimenopause Perimenopause is the normal time of a woman's life when the levels of estrogen, the female hormone produced by the ovaries, begin to decrease. This leads to changes in menstrual periods before they stop completely (menopause). Perimenopause can begin 2-8 years before menopause. During perimenopause, the ovaries may  or may not produce an egg and a woman can still become pregnant. What are the causes? This condition is caused by a natural change in hormone levels that happens as you get older. What increases the risk? This condition is more likely to start at an earlier age if you have certain medical conditions or have undergone treatments, including:  A tumor of the pituitary gland in the brain.  A disease that affects the ovaries and hormone production.  Certain cancer treatments, such as chemotherapy or hormone therapy, or radiation therapy on the pelvis.  Heavy smoking and excessive alcohol use.  Family history of early menopause. What are the signs or symptoms? Perimenopausal changes affect each woman differently. Symptoms of this condition may include:  Hot flashes.  Irregular menstrual periods.  Night sweats.  Changes in feelings about sex. This could be a decrease in sex drive or an increased discomfort around your sexuality.  Vaginal dryness.  Headaches.  Mood swings.  Depression.  Problems sleeping (insomnia).  Memory problems or trouble concentrating.  Irritability.  Tiredness.  Weight gain.  Anxiety.  Trouble getting pregnant. How is this diagnosed? This condition is diagnosed based on your medical history, a physical exam, your age, your menstrual history, and your symptoms. Hormone tests may also be done. How is this treated? In some cases, no treatment is  needed. You and your health care provider should make a decision together about whether treatment is necessary. Treatment will be based on your individual condition and preferences. Various treatments are available, such as:  Menopausal hormone therapy (MHT).  Medicines to treat specific symptoms.  Acupuncture.  Vitamin or herbal supplements. Before starting treatment, make sure to let your health care provider know if you have a personal or family history of:  Heart disease.  Breast cancer.  Blood  clots.  Diabetes.  Osteoporosis. Follow these instructions at home: Medicines  Take over-the-counter and prescription medicines only as told by your health care provider.  Take vitamin supplements only as told by your health care provider.  Talk with your health care provider before starting any herbal supplements. Lifestyle  Do not use any products that contain nicotine or tobacco, such as cigarettes, e-cigarettes, and chewing tobacco. If you need help quitting, ask your health care provider.  Get at least 30 minutes of physical activity on 5 or more days each week.  Eat a balanced diet that includes fresh fruits and vegetables, whole grains, soybeans, eggs, lean meat, and low-fat dairy.  Avoid alcoholic and caffeinated beverages, as well as spicy foods. This may help prevent hot flashes.  Get 7-8 hours of sleep each night.  Dress in layers that can be removed to help you manage hot flashes.  Find ways to manage stress, such as deep breathing, meditation, or journaling.   General instructions  Keep track of your menstrual periods, including: ? When they occur. ? How heavy they are and how long they last. ? How much time passes between periods.  Keep track of your symptoms, noting when they start, how often you have them, and how long they last.  Use vaginal lubricants or moisturizers to help with vaginal dryness and improve comfort during sex.  You can still become pregnant if you are having irregular periods. Make sure you use contraception during perimenopause if you do not want to get pregnant.  Keep all follow-up visits. This is important. This includes any group therapy or counseling.   Contact a health care provider if:  You have heavy vaginal bleeding or pass blood clots.  Your period lasts more than 2 days longer than normal.  Your periods are recurring sooner than 21 days.  You bleed after having sex.  You have pain during sex. Get help right away if you  have:  Chest pain, trouble breathing, or trouble talking.  Severe depression.  Pain when you urinate.  Severe headaches.  Vision problems. Summary  Perimenopause is the time when a woman's body begins to move into menopause. This may happen naturally or as a result of other health problems or medical treatments.  Perimenopause can begin 2-8 years before menopause, and it can last for several years.  Perimenopausal symptoms can be managed through medicines, lifestyle changes, and complementary therapies such as acupuncture. This information is not intended to replace advice given to you by your health care provider. Make sure you discuss any questions you have with your health care provider. Document Revised: 03/05/2020 Document Reviewed: 03/05/2020 Elsevier Patient Education  [place].

## 2020-12-10 ENCOUNTER — Other Ambulatory Visit (HOSPITAL_COMMUNITY): Payer: Self-pay | Admitting: Orthopedic Surgery

## 2020-12-10 LAB — SURGICAL PATHOLOGY

## 2020-12-10 MED FILL — HYDROCODON-APAP 5-325: 5-325 | 5 days supply | Qty: 20 | Fill #0

## 2020-12-10 MED FILL — predniSONE 5 MG (21) TBPK: 5 | 6 days supply | Qty: 21 | Fill #1

## 2020-12-16 LAB — CYTOLOGY - PAP
Comment: NEGATIVE
Diagnosis: UNDETERMINED — AB
High risk HPV: NEGATIVE

## 2020-12-17 ENCOUNTER — Telehealth: Payer: Self-pay

## 2020-12-17 NOTE — Telephone Encounter (Signed)
Called and verified I was speaking with correct person using two patient identifiers. Advised patient that pap smear came back ASCUS HPV negative. Advised patient she will need repeat pap smear next year with yearly exam per Dr. Ihor Dow. Pt agreed and verbalized understanding.

## 2020-12-28 ENCOUNTER — Telehealth: Payer: Self-pay | Admitting: *Deleted

## 2020-12-28 ENCOUNTER — Other Ambulatory Visit: Payer: Self-pay | Admitting: Internal Medicine

## 2020-12-28 ENCOUNTER — Other Ambulatory Visit: Payer: Self-pay

## 2020-12-28 ENCOUNTER — Ambulatory Visit (AMBULATORY_SURGERY_CENTER): Payer: No Typology Code available for payment source | Admitting: *Deleted

## 2020-12-28 VITALS — Ht 65.0 in | Wt 283.8 lb

## 2020-12-28 DIAGNOSIS — Z1210 Encounter for screening for malignant neoplasm of intestinal tract, unspecified: Secondary | ICD-10-CM

## 2020-12-28 MED ORDER — NA SULFATE-K SULFATE-MG SULF 17.5-3.13-1.6 GM/177ML PO SOLN: 1.0000 | Freq: Once | ORAL | 0 refills | Status: DC

## 2020-12-28 MED FILL — SUPREP BOWEL PREP KIT: 17.5-3.13-1 | 1 days supply | Qty: 354 | Fill #0

## 2020-12-28 NOTE — Progress Notes (Signed)
No egg or soy allergy known to patient  No issues with past sedation with any surgeries or procedures Patient denies ever being told they had issues or difficulty with intubation  No FH of Malignant Hyperthermia No diet pills per patient No home 02 use per patient  No blood thinners per patient  Pt denies issues with constipation  No A fib or A flutter  EMMI video to pt or via [facility] 19 guidelines implemented in [place] today with Pt and RN  Pt is fully vaccinated  for Covid   Virtual pre visit completed. Instructions mailed .  Due to the COVID-19 pandemic we are asking patients to follow certain guidelines.  Pt aware of COVID protocols and [facility] guidelines

## 2020-12-28 NOTE — Telephone Encounter (Signed)
Virtual pre visit completed for up-coming colonoscopy.

## 2021-01-06 ENCOUNTER — Ambulatory Visit (AMBULATORY_SURGERY_CENTER): Payer: No Typology Code available for payment source | Admitting: Internal Medicine

## 2021-01-06 ENCOUNTER — Encounter: Payer: Self-pay | Admitting: Internal Medicine

## 2021-01-06 ENCOUNTER — Other Ambulatory Visit: Payer: Self-pay

## 2021-01-06 VITALS — BP 133/69 | HR 78 | Temp 96.2°F | Resp 21 | Ht 65.0 in | Wt 283.8 lb

## 2021-01-06 DIAGNOSIS — Z1210 Encounter for screening for malignant neoplasm of intestinal tract, unspecified: Secondary | ICD-10-CM | POA: Diagnosis not present

## 2021-01-06 HISTORY — PX: COLONOSCOPY: SHX174

## 2021-01-06 MED ORDER — SODIUM CHLORIDE 0.9 % IV SOLN: 500.0000 mL | Freq: Once | INTRAVENOUS | Status: DC

## 2021-01-06 NOTE — Patient Instructions (Signed)
Please read handouts provided. Continue present medications. Repeat colonoscopy in 10 years for screening purposes.     YOU HAD AN ENDOSCOPIC PROCEDURE TODAY AT [facility]:   Refer to the procedure report that was given to you for any specific questions about what was found during the examination.  If the procedure report does not answer your questions, please call your gastroenterologist to clarify.  If you requested that your care partner not be given the details of your procedure findings, then the procedure report has been included in a sealed envelope for you to review at your convenience later.  YOU SHOULD EXPECT: Some feelings of bloating in the abdomen. Passage of more gas than usual.  Walking can help get rid of the air that was put into your GI tract during the procedure and reduce the bloating. If you had a lower endoscopy (such as a colonoscopy or flexible sigmoidoscopy) you may notice spotting of blood in your stool or on the toilet paper. If you underwent a bowel prep for your procedure, you may not have a normal bowel movement for a few days.  Please Note:  You might notice some irritation and congestion in your nose or some drainage.  This is from the oxygen used during your procedure.  There is no need for concern and it should clear up in a day or so.  SYMPTOMS TO REPORT IMMEDIATELY:   Following lower endoscopy (colonoscopy or flexible sigmoidoscopy):  Excessive amounts of blood in the stool  Significant tenderness or worsening of abdominal pains  Swelling of the abdomen that is new, acute  Fever of 100F or higher    For urgent or emergent issues, a gastroenterologist can be reached at any hour by calling [phone]. Do not use MyChart messaging for urgent concerns.    DIET:  We do recommend a small meal at first, but then you may proceed to your regular diet.  Drink plenty of fluids but you should avoid alcoholic beverages for 24  hours.  ACTIVITY:  You should plan to take it easy for the rest of today and you should NOT DRIVE or use heavy machinery until tomorrow (because of the sedation medicines used during the test).    FOLLOW UP: Our staff will call the number listed on your records 48-72 hours following your procedure to check on you and address any questions or concerns that you may have regarding the information given to you following your procedure. If we do not reach you, we will leave a message.  We will attempt to reach you two times.  During this call, we will ask if you have developed any symptoms of COVID 19. If you develop any symptoms (ie: fever, flu-like symptoms, shortness of breath, cough etc.) before then, please call [phone].  If you test positive for Covid 19 in the 2 weeks post procedure, please call and report this information to [place].    If any biopsies were taken you will be contacted by phone or by letter within the next 1-3 weeks.  Please call us at [phone] if you have not heard about the biopsies in 3 weeks.    SIGNATURES/CONFIDENTIALITY: You and/or your care partner have signed paperwork which will be entered into your electronic medical record.  These signatures attest to the fact that that the information above on your After Visit Summary has been reviewed and is understood.  Full responsibility of the confidentiality of this discharge information lies with you and/or  your care-partner. 

## 2021-01-06 NOTE — Op Note (Signed)
[facility] Patient Name: Symphanie Cederberg Procedure Date: 01/06/2021 11:03 AM MRN: [id number] Endoscopist: Docia Chuck. Henrene Pastor , MD Age: 50 Referring MD:  Date of Birth: 23-Jan-1971 Gender: Female Account #: [id number] Procedure:                Colonoscopy Indications:              Screening for colorectal malignant neoplasm Medicines:                Monitored Anesthesia Care Procedure:                Pre-Anesthesia Assessment:                           - Prior to the procedure, a History and Physical                            was performed, and patient medications and                            allergies were reviewed. The patient's tolerance of                            previous anesthesia was also reviewed. The risks                            and benefits of the procedure and the sedation                            options and risks were discussed with the patient.                            All questions were answered, and informed consent                            was obtained. Prior Anticoagulants: The patient has                            taken no previous anticoagulant or antiplatelet                            agents. ASA Grade Assessment: II - A patient with                            mild systemic disease. After reviewing the risks                            and benefits, the patient was deemed in                            satisfactory condition to undergo the procedure.                           After obtaining informed consent, the colonoscope  was passed under direct vision. Throughout the                            procedure, the patient's blood pressure, pulse, and                            oxygen saturations were monitored continuously. The                            Olympus CF-HQ190 [id number]) Colonoscope was                            introduced through the anus and advanced to the the                            cecum, identified by  appendiceal orifice and                            ileocecal valve. The ileocecal valve, appendiceal                            orifice, and rectum were photographed. The quality                            of the bowel preparation was excellent. The                            colonoscopy was performed without difficulty. The                            patient tolerated the procedure well. The bowel                            preparation used was SUPREP via split dose                            instruction. Scope In: 11:13:59 AM Scope Out: 11:25:56 AM Scope Withdrawal Time: 0 hours 9 minutes 24 seconds  Total Procedure Duration: 0 hours 11 minutes 57 seconds  Findings:                 The entire examined colon appeared normal on direct                            and retroflexion views. Complications:            No immediate complications. Estimated blood loss:                            None. Estimated Blood Loss:     Estimated blood loss: none. Impression:               - The entire examined colon is normal on direct and                            retroflexion views.                           -  No specimens collected. Recommendation:           - Repeat colonoscopy in 10 years for screening                            purposes.                           - Patient has a contact number available for                            emergencies. The signs and symptoms of potential                            delayed complications were discussed with the                            patient. Return to normal activities tomorrow.                            Written discharge instructions were provided to the                            patient.                           - Resume previous diet.                           - Continue present medications. Docia Chuck. Henrene Pastor, MD 01/06/2021 11:28:59 AM This report has been signed electronically.

## 2021-01-06 NOTE — Progress Notes (Signed)
Pt's states no medical or surgical changes since previsit or office visit. 

## 2021-01-06 NOTE — Progress Notes (Signed)
pt tolerated well. VSS. awake and to recovery. Report given to RN.  

## 2021-01-08 ENCOUNTER — Telehealth: Payer: Self-pay

## 2021-01-08 NOTE — Telephone Encounter (Signed)
  Follow up Call-  Call back number 01/06/2021  Post procedure Call Back phone  # [phone]  Permission to leave phone message Yes  Some recent data might be hidden     Patient questions:  Do you have a fever, pain , or abdominal swelling? No. Pain Score  0 *  Have you tolerated food without any problems? Yes.    Have you been able to return to your normal activities? Yes.    Do you have any questions about your discharge instructions: Diet   No. Medications  No. Follow up visit  No.  Do you have questions or concerns about your Care? No.  Actions: * If pain score is 4 or above: No action needed, pain <4. 1. Have you developed a fever since your procedure? no  2.   Have you had an respiratory symptoms (SOB or cough) since your procedure? no  3.   Have you tested positive for COVID 19 since your procedure no  4.   Have you had any family members/close contacts diagnosed with the COVID 19 since your procedure?  no   If yes to any of these questions please route to Joylene John, RN and Joella Prince, RN

## 2021-01-13 ENCOUNTER — Telehealth: Payer: Self-pay

## 2021-01-13 ENCOUNTER — Other Ambulatory Visit (HOSPITAL_BASED_OUTPATIENT_CLINIC_OR_DEPARTMENT_OTHER): Payer: Self-pay

## 2021-01-13 ENCOUNTER — Other Ambulatory Visit: Payer: Self-pay | Admitting: Obstetrics & Gynecology

## 2021-01-13 DIAGNOSIS — N939 Abnormal uterine and vaginal bleeding, unspecified: Secondary | ICD-10-CM

## 2021-01-13 MED ORDER — NORETHINDRONE ACETATE 5 MG PO TABS
5.0000 mg | ORAL_TABLET | Freq: Every day | ORAL | 1 refills | Status: DC
  Filled 2021-01-13: qty 30, 30d supply, fill #0

## 2021-01-13 NOTE — Telephone Encounter (Signed)
Patient called stating she wants refill on her birth control.   Dr. Ihor Dow reviewed chart and would like patient to stop OCPs and start 5 mg of Aygestin and make follow up appointment with her.  Patient has follow up in June with Dr. Ihor Dow appointment. Patient instructed to stop taking the birth control pills and begin taking the Aygestin daily. Kathrene Alu RN

## 2021-01-14 ENCOUNTER — Ambulatory Visit: Payer: Self-pay | Admitting: Orthopedic Surgery

## 2021-01-25 ENCOUNTER — Telehealth: Payer: Self-pay

## 2021-01-25 NOTE — Telephone Encounter (Signed)
Patient called stating that the aygestin 5mg   Given on 01/13/2021 is not helping. Patient states that on Saturday she bleed through her pants.  Patient states that the birth control worked better for stopping her bleeding. Patient states that she is actually having knee surgery on May 11th and will be out for six weeks. Patient states she is willing to think about the ablation if it could be done while she is out from knee surgery.  Patient wants something to help her bleeding now.   Will route to provider   Kathrene Alu RN

## 2021-01-26 ENCOUNTER — Other Ambulatory Visit (HOSPITAL_BASED_OUTPATIENT_CLINIC_OR_DEPARTMENT_OTHER): Payer: Self-pay

## 2021-01-26 ENCOUNTER — Other Ambulatory Visit: Payer: Self-pay | Admitting: Obstetrics & Gynecology

## 2021-01-26 DIAGNOSIS — N939 Abnormal uterine and vaginal bleeding, unspecified: Secondary | ICD-10-CM

## 2021-01-26 MED ORDER — NORETHINDRONE ACET-ETHINYL EST 1.5-30 MG-MCG PO TABS
1.0000 | ORAL_TABLET | Freq: Every day | ORAL | 4 refills | Status: DC
Start: 2021-01-26 — End: 2021-04-06
  Filled 2021-01-26 – 2021-01-27 (×3): qty 63, 63d supply, fill #0

## 2021-01-27 ENCOUNTER — Other Ambulatory Visit (HOSPITAL_COMMUNITY): Payer: Self-pay

## 2021-01-27 ENCOUNTER — Other Ambulatory Visit (HOSPITAL_BASED_OUTPATIENT_CLINIC_OR_DEPARTMENT_OTHER): Payer: Self-pay

## 2021-01-27 NOTE — Telephone Encounter (Signed)
Patient called and made aware to take her birth control continously. Patient states understanding and says the birth control better helps control her bleeding.   Patient made aware we will set her up for virtual visit on May 19th for consult for abalation (patient having knee surgery May 11). Explained she would need a note from her orthopedic doctor clearing her for gyn surgery during her knee surgery post op time period. Patient states understanding. Kathrene Alu RN

## 2021-01-28 ENCOUNTER — Encounter (HOSPITAL_COMMUNITY): Payer: Self-pay

## 2021-01-28 ENCOUNTER — Other Ambulatory Visit (HOSPITAL_COMMUNITY): Payer: Self-pay

## 2021-01-28 NOTE — Progress Notes (Signed)
DUE TO COVID-19 ONLY ONE VISITOR IS ALLOWED TO COME WITH YOU AND STAY IN THE WAITING ROOM ONLY DURING PRE OP AND PROCEDURE DAY OF SURGERY. THE 1 VISITOR  MAY VISIT WITH YOU AFTER SURGERY IN YOUR PRIVATE ROOM DURING VISITING HOURS ONLY!  YOU NEED TO HAVE A COVID 19 TEST ON___5/06/2021 ____ @_______ , THIS TEST MUST BE DONE BEFORE SURGERY,  COVID TESTING SITE [address] [place] [address], IT IS ON THE RIGHT GOING OUT [address] APPROXIMATELY  2 MINUTES PAST [employer] ON THE RIGHT. ONCE YOUR COVID TEST IS COMPLETED,  PLEASE BEGIN THE QUARANTINE INSTRUCTIONS AS OUTLINED IN YOUR HANDOUT.                Loa A Lavalais  01/28/2021   Your procedure is scheduled on:  02/10/21   Report to [facility] Main  Entrance   Report to admitting at    0930 AM     Call this number if you have problems the morning of surgery [phone]    REMEMBER: NO  SOLID FOOD CANDY OR GUM AFTER MIDNIGHT. CLEAR LIQUIDS UNTIL    0830am        . NOTHING BY MOUTH EXCEPT CLEAR LIQUIDS UNTIL 0830am    . PLEASE FINISH ENSURE DRINK PER SURGEON ORDER  WHICH NEEDS TO BE COMPLETED AT  0830am     .      CLEAR LIQUID DIET   Foods Allowed                                                                    Coffee and tea, regular and decaf                            Fruit ices (not with fruit pulp)                                      Iced Popsicles                                    Carbonated beverages, regular and diet                                    Cranberry, grape and apple juices Sports drinks like Gatorade Lightly seasoned clear broth or consume(fat free) Sugar, honey syrup ___________________________________________________________________      BRUSH YOUR TEETH MORNING OF SURGERY AND RINSE YOUR MOUTH OUT, NO CHEWING GUM CANDY OR MINTS.     Take these medicines the morning of surgery with A SIP OF WATER:  Singulair, xyzal, nasal spray  DO NOT TAKE ANY DIABETIC MEDICATIONS  DAY OF YOUR SURGERY                               You may not have any metal on your body including hair pins and  piercings  Do not wear jewelry, make-up, lotions, powders or perfumes, deodorant             Do not wear nail polish on your fingernails.  Do not shave  48 hours prior to surgery.              Men may shave face and neck.   Do not bring valuables to the hospital. [place].  Contacts, dentures or bridgework may not be worn into surgery.  Leave suitcase in the car. After surgery it may be brought to your room.     Patients discharged the day of surgery will not be allowed to drive home. IF YOU ARE HAVING SURGERY AND GOING HOME THE SAME DAY, YOU MUST HAVE AN ADULT TO DRIVE YOU HOME AND BE WITH YOU FOR 24 HOURS. YOU MAY GO HOME BY TAXI OR UBER OR ORTHERWISE, BUT AN ADULT MUST ACCOMPANY YOU HOME AND STAY WITH YOU FOR 24 HOURS.  Name and phone number of your driver:  Special Instructions: N/A              Please read over the following fact sheets you were given: _____________________________________________________________________  [employer] [facility] - Preparing for Surgery Before surgery, you can play an important role.  Because skin is not sterile, your skin needs to be as free of germs as possible.  You can reduce the number of germs on your skin by washing with CHG (chlorahexidine gluconate) soap before surgery.  CHG is an antiseptic cleaner which kills germs and bonds with the skin to continue killing germs even after washing. Please DO NOT use if you have an allergy to CHG or antibacterial soaps.  If your skin becomes reddened/irritated stop using the CHG and inform your nurse when you arrive at Short Stay. Do not shave (including legs and underarms) for at least 48 hours prior to the first CHG shower.  You may shave your face/neck. Please follow these instructions carefully:  1.  Shower with CHG Soap the night before  surgery and the  morning of Surgery.  2.  If you choose to wash your hair, wash your hair first as usual with your  normal  shampoo.  3.  After you shampoo, rinse your hair and body thoroughly to remove the  shampoo.                           4.  Use CHG as you would any other liquid soap.  You can apply chg directly  to the skin and wash                       Gently with a scrungie or clean washcloth.  5.  Apply the CHG Soap to your body ONLY FROM THE NECK DOWN.   Do not use on face/ open                           Wound or open sores. Avoid contact with eyes, ears mouth and genitals (private parts).                       Wash face,  Genitals (private parts) with your normal soap.             6.  Wash thoroughly, paying special attention to the area where your surgery  will be performed.  7.  Thoroughly rinse your body with warm water from the neck down.  8.  DO NOT shower/wash with your normal soap after using and rinsing off  the CHG Soap.                9.  Pat yourself dry with a clean towel.            10.  Wear clean pajamas.            11.  Place clean sheets on your bed the night of your first shower and do not  sleep with pets. Day of Surgery : Do not apply any lotions/deodorants the morning of surgery.  Please wear clean clothes to the hospital/surgery center.  FAILURE TO FOLLOW THESE INSTRUCTIONS MAY RESULT IN THE CANCELLATION OF YOUR SURGERY PATIENT SIGNATURE_________________________________  NURSE SIGNATURE__________________________________  ________________________________________________________________________

## 2021-02-01 ENCOUNTER — Encounter (HOSPITAL_COMMUNITY): Payer: Self-pay

## 2021-02-01 ENCOUNTER — Other Ambulatory Visit: Payer: Self-pay

## 2021-02-01 ENCOUNTER — Encounter (HOSPITAL_COMMUNITY)
Admission: RE | Admit: 2021-02-01 | Discharge: 2021-02-01 | Disposition: A | Discharge status: Home / Self Care | Payer: No Typology Code available for payment source | Source: Ambulatory Visit | Attending: Specialist | Admitting: Specialist

## 2021-02-01 DIAGNOSIS — Z01812 Encounter for preprocedural laboratory examination: Secondary | ICD-10-CM | POA: Diagnosis not present

## 2021-02-01 LAB — CBC WITH DIFFERENTIAL/PLATELET
Abs Immature Granulocytes: 0.02 10*3/uL (ref 0.00–0.07)
Basophils Absolute: 0.1 10*3/uL (ref 0.0–0.1)
Basophils Relative: 1 %
Eosinophils Absolute: 0.1 10*3/uL (ref 0.0–0.5)
Eosinophils Relative: 1 %
HCT: 32.1 % — ABNORMAL LOW (ref 36.0–46.0)
Hemoglobin: 9.5 g/dL — ABNORMAL LOW (ref 12.0–15.0)
Immature Granulocytes: 0 %
Lymphocytes Relative: 28 %
Lymphs Abs: 2.1 10*3/uL (ref 0.7–4.0)
MCH: 22.1 pg — ABNORMAL LOW (ref 26.0–34.0)
MCHC: 29.6 g/dL — ABNORMAL LOW (ref 30.0–36.0)
MCV: 74.8 fL — ABNORMAL LOW (ref 80.0–100.0)
Monocytes Absolute: 0.6 10*3/uL (ref 0.1–1.0)
Monocytes Relative: 8 %
Neutro Abs: 4.7 10*3/uL (ref 1.7–7.7)
Neutrophils Relative %: 62 %
Platelets: 323 10*3/uL (ref 150–400)
RBC: 4.29 MIL/uL (ref 3.87–5.11)
RDW: 18.2 % — ABNORMAL HIGH (ref 11.5–15.5)
WBC: 7.6 10*3/uL (ref 4.0–10.5)
nRBC: 0 % (ref 0.0–0.2)

## 2021-02-01 LAB — BASIC METABOLIC PANEL
Anion gap: 7 (ref 5–15)
BUN: 12 mg/dL (ref 6–20)
CO2: 23 mmol/L (ref 22–32)
Calcium: 8.8 mg/dL — ABNORMAL LOW (ref 8.9–10.3)
Chloride: 108 mmol/L (ref 98–111)
Creatinine, Ser: 0.78 mg/dL (ref 0.44–1.00)
GFR, Estimated: >60 mL/min (ref >60)
Glucose, Bld: 113 mg/dL — ABNORMAL HIGH (ref 70–99)
Potassium: 3.8 mmol/L (ref 3.5–5.1)
Sodium: 138 mmol/L (ref 135–145)

## 2021-02-01 NOTE — Progress Notes (Addendum)
Anesthesia Review:  PCP: DR Pershing Cox  Cardiologist : Chest x-ray : EKG : Echo : Stress test: Cardiac Cath :  Activity level: can do ao flight of stairs without difficulty  Sleep Study/ CPAP : none  Fasting Blood Sugar :      / Checks Blood Sugar -- times a day:   Blood Thinner/ Instructions /Last Dose: ASA / Instructions/ Last Dose  CBC done 02/01/21 routed to Dr Tonita Cong.   PT states she has uterine fibroids and is having some bleeding issues.  Has been seen by MD. :

## 2021-02-08 ENCOUNTER — Other Ambulatory Visit (HOSPITAL_COMMUNITY)
Admission: RE | Admit: 2021-02-08 | Discharge: 2021-02-08 | Disposition: A | Discharge status: Home / Self Care | Payer: No Typology Code available for payment source | Source: Ambulatory Visit | Attending: Specialist | Admitting: Specialist

## 2021-02-08 DIAGNOSIS — Z20822 Contact with and (suspected) exposure to covid-19: Secondary | ICD-10-CM | POA: Insufficient documentation

## 2021-02-08 DIAGNOSIS — Z01812 Encounter for preprocedural laboratory examination: Secondary | ICD-10-CM | POA: Insufficient documentation

## 2021-02-09 ENCOUNTER — Ambulatory Visit: Payer: Self-pay | Admitting: Orthopedic Surgery

## 2021-02-09 LAB — SARS CORONAVIRUS 2 (TAT 6-24 HRS): SARS Coronavirus 2: NEGATIVE

## 2021-02-09 NOTE — H&P (Signed)
Stephanie Mcconnell is an 50 y.o. female.   Chief Complaint: right knee pain HPI: Reason for Visit: Diagnositc Results (bilateral knee MRIs) Context: The patient is 3 months out Location (Lower Extremity): knee pain bilateral Severity: pain level 6/10 Timing: constant Associated Symptoms: swelling Medications: The patient is taking Norco and Robaxin as needed for severe pain  Past Medical History:  Diagnosis Date  . Allergy   . Anemia    hx of fibroid issues   . Bulging disc    lower back.   . Fibroids   . 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 Urticaria     Past Surgical History:  Procedure Laterality Date  . ENDOSCOPIC VEIN LASER TREATMENT     [facility]  . HERNIA REPAIR    . MASS EXCISION  03/12/2012   LIPOMA-  Procedure: EXCISION MASS;  Surgeon: Madilyn Hook, DO;  Location: [facility];  Service: General;  Laterality: Right;   . VENTRAL HERNIA REPAIR  03/12/2012   Procedure: LAPAROSCOPIC VENTRAL HERNIA;  Surgeon: Madilyn Hook, DO;  Location: [facility] OR;  Service: General;  Laterality: N/A;    Family History  Problem Relation Age of Onset  . Colon polyps Neg Hx   . Colon cancer Neg Hx   . Esophageal cancer Neg Hx   . Stomach cancer Neg Hx   . Rectal cancer Neg Hx    Social History:  reports that she has never smoked. She has never used smokeless tobacco. She reports current alcohol use of about 1.0 standard drink of alcohol per week. She reports that she does not use drugs.  Allergies: No Known Allergies  Current Meds: cyclobenzaprine 10 mg tablet HYDROcodone 5 mg-acetaminophen 325 mg tablet Junel 1.5/30 (21) 1.5 mg-30 mcg tablet levocetirizine 5 mg tablet methocarbamoL 500 mg tablet montelukast 10 mg tablet  Results for orders placed or performed during the hospital encounter of 02/08/21 (from the past 48 hour(s))  SARS CORONAVIRUS 2 (TAT 6-24 HRS) Nasopharyngeal Nasopharyngeal Swab     Status: None   Collection Time: 02/08/21  2:28 PM    Specimen: Nasopharyngeal Swab  Result Value Ref Range   SARS Coronavirus 2 NEGATIVE NEGATIVE    Comment: (NOTE) SARS-CoV-2 target nucleic acids are NOT DETECTED.  The SARS-CoV-2 RNA is generally detectable in upper and lower respiratory specimens during the acute phase of infection. Negative results do not preclude SARS-CoV-2 infection, do not rule out co-infections with other pathogens, and should not be used as the sole basis for treatment or other patient management decisions. Negative results must be combined with clinical observations, patient history, and epidemiological information. The expected result is Negative.  Fact Sheet for Patients: SugarRoll.be  Fact Sheet for Healthcare Providers: https://www.woods-mathews.com/  This test is not yet approved or cleared by the [place] FDA and  has been authorized for detection and/or diagnosis of SARS-CoV-2 by FDA under an Emergency Use Authorization (EUA). This EUA will remain  in effect (meaning this test can be used) for the duration of the COVID-19 declaration under Se ction 564(b)(1) of the Act, 21 U.S.C. section 360bbb-3(b)(1), unless the authorization is terminated or revoked sooner.  Performed at [facility], [place] [address], [place],  [address]    No results found.  Review of Systems  Constitutional: Negative.   HENT: Negative.   Eyes: Negative.   Respiratory: Negative.   Cardiovascular: Negative.   Gastrointestinal: Negative.   Endocrine: Negative.  Genitourinary: Negative.   Musculoskeletal: Positive for arthralgias and myalgias.  Skin: Negative.   Neurological: Negative.     Last menstrual period 02/01/2021. Physical Exam Constitutional:      Appearance: Normal appearance.  HENT:     Head: Normocephalic.     Right Ear: External ear normal.     Left Ear: External ear normal.     Nose: Nose normal.     Mouth/Throat:     Mouth: Mucous  membranes are moist.  Eyes:     Pupils: Pupils are equal, round, and reactive to light.  Cardiovascular:     Rate and Rhythm: Normal rate and regular rhythm.     Pulses: Normal pulses.  Pulmonary:     Effort: Pulmonary effort is normal.  Abdominal:     General: Bowel sounds are normal.  Musculoskeletal:     Cervical back: Normal range of motion and neck supple.     Comments: Constitutional: General Appearance: healthy-appearing and NAD.  Gait and Station: Appearance: antalgic gait.  Cardiovascular System: Arterial Pulses Right: femoral normal, popliteal normal, dorsalis pedis normal, and posterior tibialis normal. Arterial Pulses Left: femoral normal, popliteal normal, dorsalis pedis normal, and posterior tibialis normal. Edema Right: no edema. Edema Left: no edema. Varicosities Right: no varicosities. Varicosities Left: no varicosities.  Lymph Nodes: Inspection/Palpation Right: no inguinal LAD. Inspection/Palpation Left: no inguinal LAD.  Knees: Inspection Right: no deformity and swelling. Inspection Left: no deformity and swelling. Bony Palpation Right: no tenderness of the inferior pole patella, the superior pole patella, the tibial tubercle, the lateral joint line, the medial tibial plateau, Gerdy's tubercle, or the neck of fibula and tenderness of the medial joint line. Bony Palpation Left: no tenderness of the superior pole patella, the inferior pole patella, the tibial tubercle, the lateral joint line, the medial tibial plateau, Gerdy's tubercle, or the neck of fibula and tenderness of the medial joint line. Soft Tissue Palpation Right: no tenderness of the quadriceps tendon, the prepatellar bursa, the patellar tendon, the medial collateral ligament, the saphenous nerve, the lateral collateral ligament, the infrapatellar tendon, or the common peroneal nerve. Soft Tissue Palpation Left: no tenderness of the quadriceps tendon, the prepatellar bursa, the patellar tendon, the medial collateral  ligament, or the infrapatellar tendon. Active Range of Motion Right: limited. Active Range of Motion Left: limited. Passive Range of Motion RIght: limited. Passive Range of Motion Left: limited. Stability Right: no laxity or ligamentous instability and anterior drawer sign negative, posterior drawer sign negative, and Lachman test negative. Stability Left: no laxity or ligamentous instability and anterior drawer sign negative and Lachman test negative. Special Tests Right: McMurray's test positive. Special Tests Left: McMurray's test positive. Strength Right: no hamstring weakness or quadriceps weakness and flexion 5/5 and extension 5/5. Strength Left: no hamstring weakness or quadriceps weakness and flexion 5/5 and extension 5/5.  Skin: Right Lower Extremity: normal. Left Lower Extremity: normal.  Neurologic: Ankle Reflex Right: normal (2). Ankle Reflex Left: normal (2). Knee Reflex Right: normal (2). Knee Reflex Left: normal (2). Sensation on the Right: L2 normal, L3 normal, L4 normal, L5 normal, and S1 normal. Sensation on the Left: L2 normal, L3 normal, L4 normal, L5 normal, and S1 normal.  Psychiatric: Mood and Affect: active and alert and normal mood.  Skin:    General: Skin is warm and dry.  Neurological:     Mental Status: She is alert.    MRI of the right knee demonstrates a longitudinal tear in the body and posterior horn  of the medial meniscus with the medial gutter flap and mild medial chondromalacia. Moderate trochlear chondromalacia.  MRI of the left knee demonstrates a high-grade root root equivalent radial tear the posterior horn the medial meniscus 3 mm extrusion chondral fissuring of the lateral trochlea. Medial joint line soft tissue edema pes anserinus bursitis   Assessment/Plan Impression:  Symptomatic medial meniscus tear of the right knee posterior horn meniscus flap with mild chondromalacia  Left symptomatic high-grade root tear radial horn left knee  Plan:  Given  her mechanical symptoms failing conservative treatment on the right we discussed knee arthroscopy partial medial meniscectomy  I discussed the risk and benefits of knee arthroscopy including no changes in their symptoms worsening in their symptoms DVT, PE, anesthetic complications etc. Surgical possibilities include chondroplasty, microfracture, partial meniscectomy, plica excision etc. We also discussed the possible need for repeat arthroscopy in the future as well as possible continued treatment including corticosteroid injections and possible Visco supplementation. In addition we discussed the possibility of even eventually required a total knee replacement if significant arthritis encountered. Also indicating that it is an outpatient procedure. 1-2 days on crutches. Postoperative DVT prophylaxis with aspirin if tolerated. Follow-up in the office in 2 weeks following the surgery. Possible consideration of formal of supervised physical therapy as well. She does have an elevated BMI we discussed the possibility that may hinder preclude proceeding with the arthroscopy. No history of DVT chest pain heart attack or MRSA.  She prefers a walker afterwards.  We can discuss knee arthroscopy on the left after surgery on her right.  Plan right knee arthroscopy, partial medial meniscectomy, debridement  Cecilie Kicks, PA-C  For Dr. Tonita Cong 02/09/2021, 10:28 PM

## 2021-02-09 NOTE — H&P (Deleted)
  The note originally documented on this encounter has been moved the the encounter in which it belongs.  

## 2021-02-09 NOTE — Anesthesia Preprocedure Evaluation (Addendum)
Anesthesia Evaluation  Patient identified by MRN, date of birth, ID band Patient awake    Reviewed: Allergy & Precautions, NPO status , Patient's Chart, lab work & pertinent test results  History of Anesthesia Complications Negative for: history of anesthetic complications  Airway Mallampati: II  TM Distance: >3 FB Neck ROM: Full    Dental no notable dental hx. (+) Dental Advisory Given   Pulmonary neg pulmonary ROS,    Pulmonary exam normal        Cardiovascular negative cardio ROS Normal cardiovascular exam     Neuro/Psych negative neurological ROS     GI/Hepatic negative GI ROS, Neg liver ROS,   Endo/Other  Morbid obesity  Renal/GU negative Renal ROS     Musculoskeletal negative musculoskeletal ROS (+)   Abdominal   Peds  Hematology  (+) anemia ,   Anesthesia Other Findings   Reproductive/Obstetrics                            Anesthesia Physical Anesthesia Plan  ASA: III  Anesthesia Plan: General   Post-op Pain Management:    Induction: Intravenous  PONV Risk Score and Plan: 4 or greater and Ondansetron, Dexamethasone, Midazolam and Scopolamine patch - Pre-op  Airway Management Planned: LMA  Additional Equipment:   Intra-op Plan:   Post-operative Plan: Extubation in OR  Informed Consent: I have reviewed the patients History and Physical, chart, labs and discussed the procedure including the risks, benefits and alternatives for the proposed anesthesia with the patient or authorized representative who has indicated his/her understanding and acceptance.     Dental advisory given  Plan Discussed with: Anesthesiologist and CRNA  Anesthesia Plan Comments:        Anesthesia Quick Evaluation

## 2021-02-10 ENCOUNTER — Encounter (HOSPITAL_COMMUNITY): Payer: Self-pay | Admitting: Specialist

## 2021-02-10 ENCOUNTER — Other Ambulatory Visit (HOSPITAL_COMMUNITY): Payer: Self-pay

## 2021-02-10 ENCOUNTER — Encounter (HOSPITAL_COMMUNITY)
Admission: RE | Disposition: A | Discharge status: Home / Self Care | Payer: Self-pay | Source: Home / Self Care | Attending: Specialist

## 2021-02-10 ENCOUNTER — Ambulatory Visit (HOSPITAL_COMMUNITY)
Admission: RE | Admit: 2021-02-10 | Discharge: 2021-02-10 | Disposition: A | Discharge status: Home / Self Care | Payer: No Typology Code available for payment source | Attending: Specialist | Admitting: Specialist

## 2021-02-10 ENCOUNTER — Ambulatory Visit (HOSPITAL_COMMUNITY): Payer: No Typology Code available for payment source | Admitting: Physician Assistant

## 2021-02-10 ENCOUNTER — Ambulatory Visit (HOSPITAL_COMMUNITY): Payer: No Typology Code available for payment source | Admitting: Anesthesiology

## 2021-02-10 DIAGNOSIS — S83231A Complex tear of medial meniscus, current injury, right knee, initial encounter: Secondary | ICD-10-CM | POA: Diagnosis present

## 2021-02-10 DIAGNOSIS — Z8719 Personal history of other diseases of the digestive system: Secondary | ICD-10-CM | POA: Insufficient documentation

## 2021-02-10 DIAGNOSIS — X58XXXA Exposure to other specified factors, initial encounter: Secondary | ICD-10-CM | POA: Insufficient documentation

## 2021-02-10 DIAGNOSIS — M2241 Chondromalacia patellae, right knee: Secondary | ICD-10-CM | POA: Insufficient documentation

## 2021-02-10 HISTORY — PX: KNEE ARTHROSCOPY WITH MEDIAL MENISECTOMY: SHX5651

## 2021-02-10 LAB — PREGNANCY, URINE: Preg Test, Ur: NEGATIVE

## 2021-02-10 SURGERY — ARTHROSCOPY, KNEE, WITH MEDIAL MENISCECTOMY
Anesthesia: General | Site: Knee | Laterality: Right

## 2021-02-10 MED ORDER — FENTANYL CITRATE (PF) 100 MCG/2ML IJ SOLN
INTRAMUSCULAR | Status: AC
  Filled 2021-02-10: qty 2

## 2021-02-10 MED ORDER — CEFAZOLIN IN SODIUM CHLORIDE 3-0.9 GM/100ML-% IV SOLN
3.0000 g | INTRAVENOUS | Status: AC
  Administered 2021-02-10: 3 g via INTRAVENOUS
  Filled 2021-02-10: qty 100

## 2021-02-10 MED ORDER — EPINEPHRINE PF 1 MG/ML IJ SOLN
INTRAMUSCULAR | Status: DC | PRN
  Administered 2021-02-10: 2 mL

## 2021-02-10 MED ORDER — EPINEPHRINE PF 1 MG/ML IJ SOLN
INTRAMUSCULAR | Status: AC
  Filled 2021-02-10: qty 2

## 2021-02-10 MED ORDER — LACTATED RINGERS IV SOLN: INTRAVENOUS | Status: DC

## 2021-02-10 MED ORDER — FENTANYL CITRATE (PF) 100 MCG/2ML IJ SOLN
50.0000 ug | INTRAMUSCULAR | Status: DC
Start: 2021-02-10 — End: 2021-02-10

## 2021-02-10 MED ORDER — SODIUM CHLORIDE 0.9 % IR SOLN
Status: DC | PRN
  Administered 2021-02-10: 6000 mL

## 2021-02-10 MED ORDER — PROPOFOL 10 MG/ML IV BOLUS
INTRAVENOUS | Status: DC | PRN
  Administered 2021-02-10: 160 mg via INTRAVENOUS

## 2021-02-10 MED ORDER — OXYCODONE-ACETAMINOPHEN 5-325 MG PO TABS
1.0000 | ORAL_TABLET | ORAL | 0 refills | Status: AC | PRN
  Filled 2021-02-10: qty 40, 4d supply, fill #0

## 2021-02-10 MED ORDER — BUPIVACAINE-EPINEPHRINE (PF) 0.5% -1:200000 IJ SOLN
INTRAMUSCULAR | Status: AC
  Filled 2021-02-10: qty 30

## 2021-02-10 MED ORDER — DOCUSATE SODIUM 100 MG PO CAPS
100.0000 mg | ORAL_CAPSULE | Freq: Two times a day (BID) | ORAL | 1 refills | Status: DC | PRN
  Filled 2021-02-10: qty 30, 15d supply, fill #0

## 2021-02-10 MED ORDER — ACETAMINOPHEN 500 MG PO TABS
1000.0000 mg | ORAL_TABLET | Freq: Once | ORAL | Status: AC
  Administered 2021-02-10: 1000 mg via ORAL
  Filled 2021-02-10: qty 2

## 2021-02-10 MED ORDER — MIDAZOLAM HCL 5 MG/5ML IJ SOLN
INTRAMUSCULAR | Status: DC | PRN
  Administered 2021-02-10: 2 mg via INTRAVENOUS

## 2021-02-10 MED ORDER — MIDAZOLAM HCL 2 MG/2ML IJ SOLN: 1.0000 mg | INTRAMUSCULAR | Status: DC

## 2021-02-10 MED ORDER — ASPIRIN 81 MG PO TBEC
81.0000 mg | DELAYED_RELEASE_TABLET | Freq: Every day | ORAL | 1 refills | Status: DC
  Filled 2021-02-10: qty 14, 14d supply, fill #0

## 2021-02-10 MED ORDER — CHLORHEXIDINE GLUCONATE 0.12 % MT SOLN
15.0000 mL | Freq: Once | OROMUCOSAL | Status: AC
  Administered 2021-02-10: 15 mL via OROMUCOSAL

## 2021-02-10 MED ORDER — CELECOXIB 200 MG PO CAPS
200.0000 mg | ORAL_CAPSULE | Freq: Once | ORAL | Status: AC
  Administered 2021-02-10: 200 mg via ORAL
  Filled 2021-02-10: qty 1

## 2021-02-10 MED ORDER — FENTANYL CITRATE (PF) 100 MCG/2ML IJ SOLN
25.0000 ug | INTRAMUSCULAR | Status: DC | PRN
  Administered 2021-02-10 (×3): 50 ug via INTRAVENOUS

## 2021-02-10 MED ORDER — PHENYLEPHRINE 40 MCG/ML (10ML) SYRINGE FOR IV PUSH (FOR BLOOD PRESSURE SUPPORT)
PREFILLED_SYRINGE | INTRAVENOUS | Status: DC | PRN
  Administered 2021-02-10: 120 ug via INTRAVENOUS

## 2021-02-10 MED ORDER — ONDANSETRON HCL 4 MG/2ML IJ SOLN
INTRAMUSCULAR | Status: DC | PRN
  Administered 2021-02-10: 4 mg via INTRAVENOUS

## 2021-02-10 MED ORDER — SCOPOLAMINE 1 MG/3DAYS TD PT72
1.0000 | MEDICATED_PATCH | TRANSDERMAL | Status: DC
  Administered 2021-02-10: 1.5 mg via TRANSDERMAL
  Filled 2021-02-10: qty 1

## 2021-02-10 MED ORDER — DEXAMETHASONE SODIUM PHOSPHATE 10 MG/ML IJ SOLN
INTRAMUSCULAR | Status: DC | PRN
  Administered 2021-02-10: 10 mg via INTRAVENOUS

## 2021-02-10 MED ORDER — BUPIVACAINE-EPINEPHRINE 0.5% -1:200000 IJ SOLN
INTRAMUSCULAR | Status: DC | PRN
  Administered 2021-02-10: 30 mL

## 2021-02-10 MED ORDER — ORAL CARE MOUTH RINSE: 15.0000 mL | Freq: Once | OROMUCOSAL | Status: AC

## 2021-02-10 MED ORDER — MIDAZOLAM HCL 2 MG/2ML IJ SOLN
INTRAMUSCULAR | Status: AC
  Filled 2021-02-10: qty 2

## 2021-02-10 MED ORDER — DEXAMETHASONE SODIUM PHOSPHATE 10 MG/ML IJ SOLN
INTRAMUSCULAR | Status: AC
  Filled 2021-02-10: qty 1

## 2021-02-10 MED ORDER — LIDOCAINE 2% (20 MG/ML) 5 ML SYRINGE
INTRAMUSCULAR | Status: DC | PRN
  Administered 2021-02-10: 80 mg via INTRAVENOUS

## 2021-02-10 MED ORDER — FENTANYL CITRATE (PF) 250 MCG/5ML IJ SOLN
INTRAMUSCULAR | Status: DC | PRN
  Administered 2021-02-10 (×2): 100 ug via INTRAVENOUS
  Administered 2021-02-10 (×2): 25 ug via INTRAVENOUS
  Administered 2021-02-10: 50 ug via INTRAVENOUS

## 2021-02-10 MED ORDER — LIDOCAINE 2% (20 MG/ML) 5 ML SYRINGE
INTRAMUSCULAR | Status: AC
  Filled 2021-02-10: qty 5

## 2021-02-10 SURGICAL SUPPLY — 28 items
ABLATOR ASPIRATE 50D MULTI-PRT (SURGICAL WAND) ×1 IMPLANT
BLADE SHAVER TORPEDO 4X13 (MISCELLANEOUS) ×1 IMPLANT
BNDG ELASTIC 6X5.8 VLCR STR LF (GAUZE/BANDAGES/DRESSINGS) ×2 IMPLANT
BOOTIES KNEE HIGH SLOAN (MISCELLANEOUS) ×4 IMPLANT
COVER SURGICAL LIGHT HANDLE (MISCELLANEOUS) ×2 IMPLANT
COVER WAND RF STERILE (DRAPES) ×2 IMPLANT
DRAPE INCISE IOBAN 66X45 STRL (DRAPES) ×1 IMPLANT
DRSG PAD ABDOMINAL 8X10 ST (GAUZE/BANDAGES/DRESSINGS) ×2 IMPLANT
DURAPREP 26ML APPLICATOR (WOUND CARE) ×2 IMPLANT
EXCALIBUR 3.8MM X 13CM (MISCELLANEOUS) ×1 IMPLANT
GAUZE SPONGE 4X4 12PLY STRL (GAUZE/BANDAGES/DRESSINGS) ×2 IMPLANT
GLOVE SURG POLYISO LF SZ7.5 (GLOVE) ×2 IMPLANT
GLOVE SURG POLYISO LF SZ8 (GLOVE) ×2 IMPLANT
GLOVE SURG UNDER POLY LF SZ7.5 (GLOVE) ×2 IMPLANT
GOWN STRL REUS W/TWL XL LVL3 (GOWN DISPOSABLE) ×4 IMPLANT
KIT TURNOVER KIT A (KITS) ×2 IMPLANT
MANIFOLD NEPTUNE II (INSTRUMENTS) ×2 IMPLANT
NDL SAFETY ECLIPSE 18X1.5 (NEEDLE) IMPLANT
NEEDLE HYPO 18GX1.5 SHARP (NEEDLE) ×2
PACK ARTHROSCOPY WL (CUSTOM PROCEDURE TRAY) ×2 IMPLANT
PADDING CAST COTTON 6X4 STRL (CAST SUPPLIES) ×2 IMPLANT
PENCIL SMOKE EVACUATOR (MISCELLANEOUS) IMPLANT
SUT ETHILON 4 0 PS 2 18 (SUTURE) ×2 IMPLANT
SYR 3ML LL SCALE MARK (SYRINGE) ×1 IMPLANT
TOWEL OR 17X26 10 PK STRL BLUE (TOWEL DISPOSABLE) ×2 IMPLANT
TUBING ARTHROSCOPY IRRIG 16FT (MISCELLANEOUS) ×2 IMPLANT
WIPE CHG CHLORHEXIDINE 2% (PERSONAL CARE ITEMS) ×2 IMPLANT
WRAP KNEE MAXI GEL POST OP (GAUZE/BANDAGES/DRESSINGS) ×2 IMPLANT

## 2021-02-10 NOTE — Brief Op Note (Signed)
02/10/2021  12:48 PM  PATIENT:  Stephanie Mcconnell  50 y.o. female  PRE-OPERATIVE DIAGNOSIS:  Right knee medial meniscus tear  POST-OPERATIVE DIAGNOSIS:  Right knee medial meniscus tear  PROCEDURE:  Procedure(s) with comments: RIGHT KNEE ARTHROSCOPY WITH PARTIAL MEDIAL MENISECTOMY AND DEBRIDEMENT (Right) - 60 MINS  SURGEON:  Surgeon(s) and Role:    Susa Day, MD - Primary  PHYSICIAN ASSISTANT:   ASSISTANTS: Bissell   ANESTHESIA:   general  EBL:  min   BLOOD ADMINISTERED:none  DRAINS: none   LOCAL MEDICATIONS USED:  MARCAINE     SPECIMEN:  No Specimen  DISPOSITION OF SPECIMEN:  N/A  COUNTS:  YES  TOURNIQUET:  * No tourniquets in log *  DICTATION: .Other Dictation: Dictation Number [id number]  PLAN OF CARE: Discharge to home after PACU  PATIENT DISPOSITION:  PACU - hemodynamically stable.   Delay start of Pharmacological VTE agent (>24hrs) due to surgical blood loss or risk of bleeding: no

## 2021-02-10 NOTE — Anesthesia Procedure Notes (Signed)
Procedure Name: LMA Insertion Date/Time: 02/10/2021 12:05 PM Performed by: Myna Bright, CRNA Pre-anesthesia Checklist: Patient identified, Emergency Drugs available, Suction available and Patient being monitored Patient Re-evaluated:Patient Re-evaluated prior to induction Oxygen Delivery Method: Circle system utilized Preoxygenation: Pre-oxygenation with 100% oxygen Induction Type: IV induction Ventilation: Mask ventilation without difficulty LMA: LMA inserted LMA Size: 4.0 Number of attempts: 1 Placement Confirmation: positive ETCO2 and breath sounds checked- equal and bilateral Tube secured with: Tape Dental Injury: Teeth and Oropharynx as per pre-operative assessment

## 2021-02-10 NOTE — Anesthesia Postprocedure Evaluation (Signed)
Anesthesia Post Note  Patient: Stephanie Mcconnell  Procedure(s) Performed: RIGHT KNEE ARTHROSCOPY WITH PARTIAL MEDIAL MENISECTOMY AND DEBRIDEMENT (Right Knee)     Patient location during evaluation: PACU Anesthesia Type: General Level of consciousness: sedated Pain management: pain level controlled Vital Signs Assessment: post-procedure vital signs reviewed and stable Respiratory status: spontaneous breathing and respiratory function stable Cardiovascular status: stable Postop Assessment: no apparent nausea or vomiting Anesthetic complications: no   No complications documented.  Last Vitals:  Vitals:   02/10/21 1330 02/10/21 1345  BP: 133/82 132/76  Pulse: 74 79  Resp: 12 16  Temp:  (!) 36.4 C  SpO2: 94% 96%    Last Pain:  Vitals:   02/10/21 1345  TempSrc:   PainSc: Asleep                 Tanmay Halteman DANIEL

## 2021-02-10 NOTE — Discharge Instructions (Signed)
ARTHROSCOPIC KNEE SURGERY HOME CARE INSTRUCTIONS   PAIN You will be expected to have a moderate amount of pain in the affected knee for approximately two weeks.  However, the first two to four days will be the most severe in terms of the pain you will experience.  Prescriptions have been provided for you to take as needed for the pain.  The pain can be markedly reduced by using the ice/compressive bandage given.  Exchange the ice packs whenever they thaw.  During the night, keep the bandage on because it will still provide some compression for the swelling.  Also, keep the leg elevated on pillows above your heart, and this will help alleviate the pain and swelling.  MEDICATION Prescriptions have been provided to take as needed for pain. To prevent blood clots, take Aspirin 325mg daily with a meal if not on a blood thinner and if no history of stomach ulcers.  ACTIVITY It is preferred that you stay on bedrest for approximately 24 hours.  However, you may go to the bathroom with help.  After this, you can start to be up and about progressively more.  Remember that the swelling may still increase after three to four days if you are up and doing too much.  You may put as much weight on the affected leg as pain will allow.  Use your crutches for comfort and safety.  However, as soon as you are able, you may discard the crutches and go without them.   DRESSING Keep the current dressing as dry as possible.  Two days after your surgery, you may remove the ice/compressive wrap, and surgical dressing.  You may now take a shower, but do not scrub the sounds directly with soap.  Let water rinse over these and gently wipe with your hand.  Reapply band-aids over the puncture wounds and more gauze if needed.  A slight amount of thin drainage can be normal at this time, and do not let it frighten you.  Reapply the ice/compressive wrap.  You may now repeat this every day each time you shower.  SYMPTOMS TO REPORT TO  YOUR DOCTOR  -Extreme pain.  -Extreme swelling.  -Temperature above 101 degrees that does not come down with acetaminophen     (Tylenol).  -Any changes in the feeling, color or movement of your toes.  -Extreme redness, heat, swelling or drainage at your incision  EXERCISE It is preferred that you begin to exercise on the day of your surgery.  Straight leg raises and short arc quads should be begun the afternoon or evening of surgery and continued until you come back for your follow-up appointment.   Attached is an instruction sheet on how to perform these two simple exercises.  Do these at least three times per day if not more.  You may bend your knee as much as is comfortable.  The puncture wounds may occasionally be slightly uncomfortable with bending of the knee.  Do not let this frighten you.  It is important to keep your knee motion, but do not overdo it.  If you have significant pain, simply do not bend the knee as far.   You will be given more exercises to perform at your first return visit.    RETURN APPOINTMENT Please make an appointment to be seen by your doctor in 10-14 days from your surgery.  Patient Signature:  ________________________________________________________  Nurse's Signature:  ________________________________________________________ 

## 2021-02-10 NOTE — Transfer of Care (Signed)
Immediate Anesthesia Transfer of Care Note  Patient: Laree A Kil  Procedure(s) Performed: RIGHT KNEE ARTHROSCOPY WITH PARTIAL MEDIAL MENISECTOMY AND DEBRIDEMENT (Right Knee)  Patient Location: PACU  Anesthesia Type:General  Level of Consciousness: awake, alert , oriented and patient cooperative  Airway & Oxygen Therapy: Patient Spontanous Breathing and Patient connected to face mask oxygen  Post-op Assessment: Report given to RN, Post -op Vital signs reviewed and stable and Patient moving all extremities  Post vital signs: Reviewed and stable  Last Vitals:  Vitals Value Taken Time  BP 148/92 02/10/21 1302  Temp    Pulse 86 02/10/21 1304  Resp 16 02/10/21 1304  SpO2 100 % 02/10/21 1304  Vitals shown include unvalidated device data.  Last Pain:  Vitals:   02/10/21 0940  TempSrc: Oral  PainSc: 5       Patients Stated Pain Goal: 4 (84/53/64 [id number])  Complications: No complications documented.

## 2021-02-11 ENCOUNTER — Encounter (HOSPITAL_COMMUNITY): Payer: Self-pay | Admitting: Specialist

## 2021-02-11 NOTE — Op Note (Signed)
Stephanie Mcconnell, Stephanie Mcconnell MEDICAL RECORD NO: [id number] ACCOUNT NO: [id number] DATE OF BIRTH: 1971-07-13 FACILITY: [facility] LOCATION: [facility] PHYSICIAN: Johnn Hai, MD  Operative Report   DATE OF PROCEDURE: 02/10/2021  PREOPERATIVE DIAGNOSES: 1.  Medial meniscus tear, right knee. 2.  Elevated BMI of 46.8.  POSTOPERATIVE DIAGNOSES: 1.  Medial meniscus tear, complex, right knee. 2.  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  PROCEDURE PERFORMED:   1.  Right knee arthroscopy. 2.  Partial medial meniscectomy. 3.  Chondroplasty of patella, femoral sulcus and medial femoral condyle.  ANESTHESIA:  General.  ASSISTANT:  Lacie Draft, PA  DESCRIPTION OF PROCEDURE:  With the patient in supine beach chair position, after induction of adequate general anesthesia, 3 grams Kefzol, the right lower extremity was prepped and draped in the usual sterile fashion.  A lateral parapatellar portal was  fashioned with a #11 blade.  Ingress cannula atraumatically placed.  Irrigant was utilized to insufflate the joint.  The patient had elevated BMI.  Clear synovial fluid was evacuated.  Under direct visualization, a medial parapatellar portal was  fashioned with a #11 blade after localization with an 18-gauge needle, sparing the medial meniscus.  65 mL of murky arthroscopic fluid was utilized.  Noted was extensive grade III changes in the medial femoral condyle, introduced a shaver and performed a  light chondroplasty of the medial femoral condyle.  There is a flap tear of the medial meniscus, extending to the medial aspect of the tibial plateau.  This was reduced with a probe.  I then resected this with a combination of a straight upbiting basket  shaver and ArthroWand.  This extended posteriorly to the posterior horn.  Approximately one-third of the posterior third was excised and flap tear continued into the mid third.  Approximately half of the mid third was excised and  it was beveled on  either side of the resection.  The remnant was stable to probe palpation.  ACL was unremarkable.  Lateral compartment revealed normal femoral condyle and tibial plateau and meniscus stable to probe palpation.  Suprapatellar pouch revealed some grade III changes of the patella, grade III changes of the femoral sulcus.  Light chondroplasty performed of both.  There was normal patellofemoral tracking.  Gutters unremarkable.  I revisited all compartments.  No further pathology amenable to arthroscopic intervention.  I, therefore, removed all instrumentation.  Portals were closed with 4-0 nylon simple sutures.  0.25% Marcaine with epinephrine was infiltrated in the joint.   Wound was dressed sterilely, awoken without difficulty and transported to the recovery room in satisfactory condition.  The patient tolerated the procedure well.  No complications.  Assistant, Lacie Draft, [place].  Minimal blood loss.   SHW D: 02/10/2021 12:52:35 pm T: 02/11/2021 1:39:00 am  JOB: [id number]/ [id number]

## 2021-02-18 ENCOUNTER — Telehealth (INDEPENDENT_AMBULATORY_CARE_PROVIDER_SITE_OTHER): Payer: No Typology Code available for payment source | Admitting: Obstetrics & Gynecology

## 2021-02-18 ENCOUNTER — Encounter: Payer: Self-pay | Admitting: Obstetrics & Gynecology

## 2021-02-18 DIAGNOSIS — N939 Abnormal uterine and vaginal bleeding, unspecified: Secondary | ICD-10-CM | POA: Diagnosis not present

## 2021-02-18 NOTE — Progress Notes (Signed)
GYNECOLOGY VIRTUAL VISIT ENCOUNTER NOTE  Provider location: [facility]   Patient location: Home  I connected with Stephanie Mcconnell on 02/18/21 at  1:15 PM EDT by MyChart Video Encounter and verified that I am speaking with the correct person using two identifiers.   I discussed the limitations, risks, security and privacy concerns of performing an evaluation and management service virtually and the availability of in person appointments. I also discussed with the patient that there may be a patient responsible charge related to this service. The patient expressed understanding and agreed to proceed.   History:  Stephanie Mcconnell is a 50 y.o. [id number] female being evaluated today for AUB. Pt has been followed and reports small amounts of daily bleeding.  She is currently on LoEstrin 1.5/30 1 pill daily. She bleeds a small amount in spite of the meds. She has failed progestins and requests definitve management.  Pt is s/p repair of meniscus on right knee on May 11.  She will be home for 6 weeks and would like a procedure done during this time.  She is ambulatory. She denies any abnormal vaginal discharge, pelvic pain or other concerns.       Past Medical History:  Diagnosis Date  . Allergy   . Anemia    hx of fibroid issues   . Bulging disc    lower back.   . Fibroids   . 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 Urticaria    Past Surgical History:  Procedure Laterality Date  . ENDOSCOPIC VEIN LASER TREATMENT     [facility]  . HERNIA REPAIR    . KNEE ARTHROSCOPY WITH MEDIAL MENISECTOMY Right 02/10/2021   Procedure: RIGHT KNEE ARTHROSCOPY WITH PARTIAL MEDIAL MENISECTOMY AND DEBRIDEMENT;  Surgeon: Susa Day, MD;  Location: [facility];  Service: Orthopedics;  Laterality: Right;  60 MINS  . MASS EXCISION  03/12/2012   LIPOMA-  Procedure: EXCISION MASS;  Surgeon: Madilyn Hook, DO;  Location: [place];  Service: General;   Laterality: Right;   . VENTRAL HERNIA REPAIR  03/12/2012   Procedure: LAPAROSCOPIC VENTRAL HERNIA;  Surgeon: Madilyn Hook, DO;  Location: [place];  Service: General;  Laterality: N/A;   The following portions of the patient's history were reviewed and updated as appropriate: allergies, current medications, past family history, past medical history, past social history, past surgical history and problem list.     Review of Systems:  Pertinent items noted in HPI and remainder of comprehensive ROS otherwise negative.  Physical Exam:   General:  Alert, oriented and cooperative. Patient appears to be in no acute distress.  Mental Status: Normal mood and affect. Normal behavior. Normal judgment and thought content.   Respiratory: Normal respiratory effort, no problems with respiration noted  Rest of physical exam deferred due to type of encounter  Labs and Imaging Results for orders placed or performed during the hospital encounter of 02/10/21 (from the past 336 hour(s))  Pregnancy, urine   Collection Time: 02/10/21  9:30 AM  Result Value Ref Range   Preg Test, Ur NEGATIVE NEGATIVE  Results for orders placed or performed during the hospital encounter of 02/08/21 (from the past 336 hour(s))  SARS CORONAVIRUS 2 (TAT 6-24 HRS) Nasopharyngeal Nasopharyngeal Swab   Collection Time: 02/08/21  2:28 PM   Specimen: Nasopharyngeal Swab  Result Value Ref Range   SARS Coronavirus 2 NEGATIVE NEGATIVE   12/09/2020  FINAL MICROSCOPIC DIAGNOSIS:   A. ENDOMETRIUM, BIOPSY:  - Inactive endometrium with breakdown and surface reactive change. No  malignancy identified.   Assessment and Plan:     AUB- not improved with pharmacologic management.       Patient desires surgical management with hysteroscopy, dilation and curettage and endometrial ablation.   The risks of surgery were discussed in detail with the patient including but not limited to: bleeding which may require transfusion or reoperation;  infection which may require prolonged hospitalization or re-hospitalization and antibiotic therapy; injury to bowel, bladder, ureters and major vessels or other surrounding organs; need for additional procedures including laparotomy; thromboembolic phenomenon, incisional problems and other postoperative or anesthesia complications.  Patient was told that the likelihood that her condition and symptoms will be treated effectively with this surgical management was very high; the postoperative expectations were also discussed in detail. The patient also understands the alternative treatment options which were discussed in full. All questions were answered.  She was told that she will be contacted by our surgical scheduler regarding the time and date of her surgery; routine preoperative instructions of having nothing to eat or drink after midnight on the day prior to surgery and also coming to the hospital 1 1/2 hours prior to her time of surgery were also emphasized.  She was told she may be called for a preoperative appointment about a week prior to surgery and will be given further preoperative instructions at that visit. Printed patient education handouts about the procedure were given to the patient to review at home.  I discussed the assessment and treatment plan with the patient. The patient was provided an opportunity to ask questions and all were answered. The patient agreed with the plan and demonstrated an understanding of the instructions.   The patient was advised to call back or seek an in-person evaluation/go to the ED if the symptoms worsen or if the condition fails to improve as anticipated.  I provided 25 minutes of face-to-face time, review of records and charting for w during this encounter.   Lavonia Drafts, MD [facility], [place]

## 2021-02-22 ENCOUNTER — Encounter: Payer: Self-pay | Admitting: *Deleted

## 2021-02-22 ENCOUNTER — Telehealth: Payer: Self-pay | Admitting: *Deleted

## 2021-02-22 NOTE — Telephone Encounter (Signed)
Call to patient. Advised surgery scheduled for 03-09-21 at 1130- arrive 0930 at [facility]. Advised will receive letter in mail with directions and pre-op nurse will call with additional instructions. Patient agreeable to date.  Encounter closed.

## 2021-03-03 ENCOUNTER — Encounter: Payer: Self-pay | Admitting: General Practice

## 2021-03-04 ENCOUNTER — Encounter (HOSPITAL_BASED_OUTPATIENT_CLINIC_OR_DEPARTMENT_OTHER): Payer: Self-pay | Admitting: Obstetrics & Gynecology

## 2021-03-04 DIAGNOSIS — Z8616 Personal history of COVID-19: Secondary | ICD-10-CM

## 2021-03-04 DIAGNOSIS — U071 COVID-19: Secondary | ICD-10-CM

## 2021-03-04 HISTORY — DX: COVID-19: U07.1

## 2021-03-04 HISTORY — DX: Personal history of COVID-19: Z86.16

## 2021-03-08 ENCOUNTER — Ambulatory Visit: Payer: No Typology Code available for payment source | Admitting: Obstetrics & Gynecology

## 2021-03-09 ENCOUNTER — Ambulatory Visit (HOSPITAL_BASED_OUTPATIENT_CLINIC_OR_DEPARTMENT_OTHER): Admit: 2021-03-09 | Payer: No Typology Code available for payment source | Admitting: Obstetrics & Gynecology

## 2021-03-09 HISTORY — DX: Excessive and frequent menstruation with regular cycle: N92.0

## 2021-03-09 SURGERY — HYSTEROSCOPY WITH NOVASURE
Anesthesia: Choice

## 2021-03-16 ENCOUNTER — Other Ambulatory Visit (HOSPITAL_COMMUNITY): Payer: Self-pay

## 2021-03-16 MED ORDER — LEVOCETIRIZINE DIHYDROCHLORIDE 5 MG PO TABS
5.0000 mg | ORAL_TABLET | Freq: Every day | ORAL | 3 refills | Status: DC
  Filled 2021-03-16 – 2021-04-06 (×2): qty 90, 90d supply, fill #0
  Filled 2021-10-21: qty 90, 90d supply, fill #1

## 2021-03-16 MED ORDER — FLUTICASONE PROPIONATE 50 MCG/ACT NA SUSP
NASAL | 3 refills | Status: DC
  Filled 2021-03-16 – 2021-04-06 (×2): qty 48, 90d supply, fill #0

## 2021-03-16 MED ORDER — MONTELUKAST SODIUM 10 MG PO TABS
10.0000 mg | ORAL_TABLET | Freq: Every day | ORAL | 3 refills | Status: DC
  Filled 2021-03-16 – 2021-10-21 (×2): qty 90, 90d supply, fill #0

## 2021-03-16 MED ORDER — AZELASTINE HCL 137 MCG/SPRAY NA SOLN
NASAL | 1 refills | Status: DC
  Filled 2021-03-16 – 2021-04-06 (×2): qty 60, 90d supply, fill #0

## 2021-03-25 ENCOUNTER — Other Ambulatory Visit (HOSPITAL_COMMUNITY): Payer: Self-pay

## 2021-03-25 ENCOUNTER — Ambulatory Visit
Admission: RE | Admit: 2021-03-25 | Discharge: 2021-03-25 | Disposition: A | Discharge status: Home / Self Care | Payer: 59 | Source: Ambulatory Visit | Attending: Family Medicine | Admitting: Family Medicine

## 2021-03-25 ENCOUNTER — Other Ambulatory Visit: Payer: Self-pay

## 2021-03-25 DIAGNOSIS — Z1231 Encounter for screening mammogram for malignant neoplasm of breast: Secondary | ICD-10-CM

## 2021-04-01 ENCOUNTER — Other Ambulatory Visit: Payer: Self-pay

## 2021-04-01 ENCOUNTER — Encounter (HOSPITAL_BASED_OUTPATIENT_CLINIC_OR_DEPARTMENT_OTHER): Payer: Self-pay | Admitting: Obstetrics & Gynecology

## 2021-04-01 NOTE — Progress Notes (Addendum)
ADDENDUM:  Chart reviewed w/ anesthesia, Dr Margarita Grizzle, pt BMI 46.59, pt had recent surgery at [facility] 02-10-2021 airway evaluated at that time.  Dr Elgie Congo stated ok to proceed.   Spoke w/ via phone for pre-op interview--- Pt Lab needs dos----  Urine preg             Lab results------ no COVID test -----patient states asymptomatic no test needed Arrive at ------- 0645 on 04-06-2021 NPO after MN NO Solid Food.  Clear liquids from MN until--- 0545 Med rec completed Medications to take morning of surgery ----- NONE Diabetic medication ----- n/a Patient instructed no nail polish to be worn day of surgery Patient instructed to bring photo id and insurance card day of surgery Patient aware to have Driver (ride ) / caregiver for 24 hours after surgery -- husband, [facility] Patient Special Instructions ----- n/a Pre-Op special Istructions -----n/a Patient verbalized understanding of instructions that were given at this phone interview. Patient denies shortness of breath, chest pain, fever, cough at this phone interview. 5

## 2021-04-05 NOTE — Anesthesia Preprocedure Evaluation (Addendum)
Anesthesia Evaluation  Patient identified by MRN, date of birth, ID band Patient awake    Reviewed: Allergy & Precautions, NPO status , Patient's Chart, lab work & pertinent test results  Airway Mallampati: II  TM Distance: >3 FB Neck ROM: Full    Dental no notable dental hx. (+) Teeth Intact, Dental Advisory Given   Pulmonary neg pulmonary ROS,    Pulmonary exam normal breath sounds clear to auscultation       Cardiovascular Normal cardiovascular exam Rhythm:Regular Rate:Normal     Neuro/Psych negative neurological ROS  negative psych ROS   GI/Hepatic negative GI ROS, Neg liver ROS,   Endo/Other  Morbid obesity  Renal/GU negative Renal ROS     Musculoskeletal  (+) Arthritis ,   Abdominal (+) + obese,   Peds  Hematology  (+) anemia , Lab Results      Component                Value               Date                      WBC                      7.6                 02/01/2021                HGB                      9.5 (L)             02/01/2021                HCT                      32.1 (L)            02/01/2021                MCV                      74.8 (L)            02/01/2021                PLT                      323                 02/01/2021              Anesthesia Other Findings   Reproductive/Obstetrics                            Anesthesia Physical Anesthesia Plan  ASA: 3  Anesthesia Plan: General   Post-op Pain Management:    Induction: Intravenous  PONV Risk Score and Plan: 4 or greater and Midazolam, Treatment may vary due to age or medical condition and Ondansetron  Airway Management Planned: LMA  Additional Equipment: None  Intra-op Plan:   Post-operative Plan:   Informed Consent: I have reviewed the patients History and Physical, chart, labs and discussed the procedure including the risks, benefits and alternatives for the proposed anesthesia with the  patient or authorized representative who has indicated his/her understanding and acceptance.  Dental advisory given  Plan Discussed with: CRNA  Anesthesia Plan Comments: (LMA GA)       Anesthesia Quick Evaluation

## 2021-04-06 ENCOUNTER — Ambulatory Visit (HOSPITAL_BASED_OUTPATIENT_CLINIC_OR_DEPARTMENT_OTHER): Payer: No Typology Code available for payment source | Admitting: Anesthesiology

## 2021-04-06 ENCOUNTER — Encounter (HOSPITAL_BASED_OUTPATIENT_CLINIC_OR_DEPARTMENT_OTHER)
Admission: RE | Disposition: A | Discharge status: Home / Self Care | Payer: Self-pay | Source: Home / Self Care | Attending: Obstetrics & Gynecology

## 2021-04-06 ENCOUNTER — Encounter (HOSPITAL_BASED_OUTPATIENT_CLINIC_OR_DEPARTMENT_OTHER): Payer: Self-pay | Admitting: Obstetrics & Gynecology

## 2021-04-06 ENCOUNTER — Ambulatory Visit (HOSPITAL_BASED_OUTPATIENT_CLINIC_OR_DEPARTMENT_OTHER)
Admission: RE | Admit: 2021-04-06 | Discharge: 2021-04-06 | Disposition: A | Discharge status: Home / Self Care | Payer: No Typology Code available for payment source | Attending: Obstetrics & Gynecology | Admitting: Obstetrics & Gynecology

## 2021-04-06 ENCOUNTER — Other Ambulatory Visit (HOSPITAL_COMMUNITY): Payer: Self-pay

## 2021-04-06 ENCOUNTER — Other Ambulatory Visit: Payer: Self-pay

## 2021-04-06 DIAGNOSIS — N814 Uterovaginal prolapse, unspecified: Secondary | ICD-10-CM | POA: Diagnosis not present

## 2021-04-06 DIAGNOSIS — Z7982 Long term (current) use of aspirin: Secondary | ICD-10-CM | POA: Diagnosis not present

## 2021-04-06 DIAGNOSIS — Z79899 Other long term (current) drug therapy: Secondary | ICD-10-CM | POA: Diagnosis not present

## 2021-04-06 DIAGNOSIS — Z793 Long term (current) use of hormonal contraceptives: Secondary | ICD-10-CM | POA: Insufficient documentation

## 2021-04-06 DIAGNOSIS — D259 Leiomyoma of uterus, unspecified: Secondary | ICD-10-CM | POA: Diagnosis not present

## 2021-04-06 DIAGNOSIS — N920 Excessive and frequent menstruation with regular cycle: Secondary | ICD-10-CM | POA: Diagnosis present

## 2021-04-06 DIAGNOSIS — N813 Complete uterovaginal prolapse: Secondary | ICD-10-CM | POA: Diagnosis not present

## 2021-04-06 DIAGNOSIS — Z8616 Personal history of COVID-19: Secondary | ICD-10-CM | POA: Diagnosis not present

## 2021-04-06 DIAGNOSIS — D252 Subserosal leiomyoma of uterus: Secondary | ICD-10-CM | POA: Diagnosis not present

## 2021-04-06 DIAGNOSIS — N939 Abnormal uterine and vaginal bleeding, unspecified: Secondary | ICD-10-CM

## 2021-04-06 DIAGNOSIS — Z6841 Body Mass Index (BMI) 40.0 and over, adult: Secondary | ICD-10-CM | POA: Diagnosis not present

## 2021-04-06 HISTORY — DX: Unspecified osteoarthritis, unspecified site: M19.90

## 2021-04-06 HISTORY — DX: Other seasonal allergic rhinitis: J30.2

## 2021-04-06 HISTORY — PX: HYSTEROSCOPY WITH NOVASURE: SHX5574

## 2021-04-06 HISTORY — DX: Asymptomatic varicose veins of unspecified lower extremity: I83.90

## 2021-04-06 HISTORY — DX: Unspecified tear of unspecified meniscus, current injury, left knee, initial encounter: S83.207A

## 2021-04-06 HISTORY — DX: Leiomyoma of uterus, unspecified: D25.9

## 2021-04-06 LAB — POCT PREGNANCY, URINE: Preg Test, Ur: NEGATIVE

## 2021-04-06 SURGERY — HYSTEROSCOPY WITH NOVASURE
Anesthesia: General

## 2021-04-06 MED ORDER — LIDOCAINE HCL (CARDIAC) PF 50 MG/5ML IV SOSY
PREFILLED_SYRINGE | INTRAVENOUS | Status: DC | PRN
  Administered 2021-04-06: 100 mg via INTRAVENOUS
  Administered 2021-04-06: 60 mg via INTRAVENOUS

## 2021-04-06 MED ORDER — LIDOCAINE HCL (PF) 2 % IJ SOLN
INTRAMUSCULAR | Status: AC
  Filled 2021-04-06: qty 5

## 2021-04-06 MED ORDER — BUPIVACAINE HCL (PF) 0.5 % IJ SOLN
INTRAMUSCULAR | Status: DC | PRN
  Administered 2021-04-06: 20 mL

## 2021-04-06 MED ORDER — ONDANSETRON HCL 4 MG/2ML IJ SOLN
INTRAMUSCULAR | Status: AC
  Filled 2021-04-06: qty 2

## 2021-04-06 MED ORDER — ONDANSETRON HCL 4 MG/2ML IJ SOLN: 4.0000 mg | Freq: Once | INTRAMUSCULAR | Status: DC | PRN

## 2021-04-06 MED ORDER — OXYCODONE HCL 5 MG/5ML PO SOLN: 5.0000 mg | Freq: Once | ORAL | Status: DC | PRN

## 2021-04-06 MED ORDER — IBUPROFEN 600 MG PO TABS
600.0000 mg | ORAL_TABLET | Freq: Three times a day (TID) | ORAL | 0 refills | Status: AC | PRN
  Filled 2021-04-06: qty 30, 10d supply, fill #0

## 2021-04-06 MED ORDER — DEXMEDETOMIDINE (PRECEDEX) IN NS 20 MCG/5ML (4 MCG/ML) IV SYRINGE
PREFILLED_SYRINGE | INTRAVENOUS | Status: DC | PRN
  Administered 2021-04-06: 4 ug via INTRAVENOUS
  Administered 2021-04-06: 12 ug via INTRAVENOUS
  Administered 2021-04-06: 4 ug via INTRAVENOUS

## 2021-04-06 MED ORDER — ONDANSETRON HCL 4 MG/2ML IJ SOLN
INTRAMUSCULAR | Status: DC | PRN
  Administered 2021-04-06: 4 mg via INTRAVENOUS

## 2021-04-06 MED ORDER — PROPOFOL 10 MG/ML IV BOLUS
INTRAVENOUS | Status: AC
  Filled 2021-04-06: qty 20

## 2021-04-06 MED ORDER — FENTANYL CITRATE (PF) 100 MCG/2ML IJ SOLN
INTRAMUSCULAR | Status: DC | PRN
  Administered 2021-04-06 (×2): 50 ug via INTRAVENOUS

## 2021-04-06 MED ORDER — MIDAZOLAM HCL 2 MG/2ML IJ SOLN
INTRAMUSCULAR | Status: AC
  Filled 2021-04-06: qty 2

## 2021-04-06 MED ORDER — SODIUM CHLORIDE 0.9 % IR SOLN
Status: DC | PRN
  Administered 2021-04-06: 1000 mL

## 2021-04-06 MED ORDER — LACTATED RINGERS IV SOLN
INTRAVENOUS | Status: DC
Start: 2021-04-06 — End: 2021-04-06

## 2021-04-06 MED ORDER — LACTATED RINGERS IV SOLN: INTRAVENOUS | Status: DC

## 2021-04-06 MED ORDER — DEXMEDETOMIDINE (PRECEDEX) IN NS 20 MCG/5ML (4 MCG/ML) IV SYRINGE
PREFILLED_SYRINGE | INTRAVENOUS | Status: AC
  Filled 2021-04-06: qty 5

## 2021-04-06 MED ORDER — KETOROLAC TROMETHAMINE 30 MG/ML IJ SOLN
INTRAMUSCULAR | Status: DC | PRN
  Administered 2021-04-06: 30 mg via INTRAVENOUS

## 2021-04-06 MED ORDER — DEXAMETHASONE SODIUM PHOSPHATE 4 MG/ML IJ SOLN
INTRAMUSCULAR | Status: DC | PRN
  Administered 2021-04-06: 10 mg via INTRAVENOUS

## 2021-04-06 MED ORDER — MIDAZOLAM HCL 5 MG/5ML IJ SOLN
INTRAMUSCULAR | Status: DC | PRN
  Administered 2021-04-06: 2 mg via INTRAVENOUS

## 2021-04-06 MED ORDER — KETOROLAC TROMETHAMINE 30 MG/ML IJ SOLN: 30.0000 mg | Freq: Once | INTRAMUSCULAR | Status: DC | PRN

## 2021-04-06 MED ORDER — OXYCODONE HCL 5 MG PO TABS: 5.0000 mg | ORAL_TABLET | Freq: Once | ORAL | Status: DC | PRN

## 2021-04-06 MED ORDER — PROPOFOL 10 MG/ML IV BOLUS
INTRAVENOUS | Status: DC | PRN
  Administered 2021-04-06: 200 mg via INTRAVENOUS

## 2021-04-06 MED ORDER — HYDROMORPHONE HCL 1 MG/ML IJ SOLN: 0.2500 mg | INTRAMUSCULAR | Status: DC | PRN

## 2021-04-06 MED ORDER — DEXAMETHASONE SODIUM PHOSPHATE 10 MG/ML IJ SOLN
INTRAMUSCULAR | Status: AC
  Filled 2021-04-06: qty 1

## 2021-04-06 MED ORDER — FENTANYL CITRATE (PF) 100 MCG/2ML IJ SOLN
INTRAMUSCULAR | Status: AC
  Filled 2021-04-06: qty 2

## 2021-04-06 MED ORDER — KETOROLAC TROMETHAMINE 30 MG/ML IJ SOLN
INTRAMUSCULAR | Status: AC
  Filled 2021-04-06: qty 1

## 2021-04-06 SURGICAL SUPPLY — 12 items
ABLATOR SURESOUND NOVASURE (ABLATOR) ×3 IMPLANT
CATH ROBINSON RED A/P 16FR (CATHETERS) ×2 IMPLANT
GAUZE 4X4 16PLY ~~LOC~~+RFID DBL (SPONGE) ×3 IMPLANT
GLOVE SURG ENC MOIS LTX SZ7 (GLOVE) ×3 IMPLANT
GLOVE SURG UNDER POLY LF SZ7 (GLOVE) ×3 IMPLANT
GOWN STRL REUS W/TWL LRG LVL3 (GOWN DISPOSABLE) ×3 IMPLANT
KIT PROCEDURE FLUENT (KITS) ×3 IMPLANT
KIT TURNOVER CYSTO (KITS) ×3 IMPLANT
PACK VAGINAL MINOR WOMEN LF (CUSTOM PROCEDURE TRAY) ×3 IMPLANT
PAD OB MATERNITY 4.3X12.25 (PERSONAL CARE ITEMS) ×3 IMPLANT
SEAL ROD LENS SCOPE MYOSURE (ABLATOR) ×3 IMPLANT
TOWEL OR 17X26 10 PK STRL BLUE (TOWEL DISPOSABLE) ×3 IMPLANT

## 2021-04-06 NOTE — Discharge Instructions (Signed)

## 2021-04-06 NOTE — Anesthesia Postprocedure Evaluation (Signed)
Anesthesia Post Note  Patient: Stephanie Mcconnell  Procedure(s) Performed: [place]     Patient location during evaluation: PACU Anesthesia Type: General Level of consciousness: awake and alert Pain management: pain level controlled Vital Signs Assessment: post-procedure vital signs reviewed and stable Respiratory status: spontaneous breathing, nonlabored ventilation, respiratory function stable and patient connected to nasal cannula oxygen Cardiovascular status: blood pressure returned to baseline and stable Postop Assessment: no apparent nausea or vomiting Anesthetic complications: no   No notable events documented.  Last Vitals:  Vitals:   04/06/21 1000 04/06/21 1040  BP: 131/89 129/76  Pulse: 63 61  Resp: 16 16  Temp:  36.4 C  SpO2: 100% 100%    Last Pain:  Vitals:   04/06/21 1040  TempSrc: Oral  PainSc: 3                  Barnet Glasgow

## 2021-04-06 NOTE — Anesthesia Postprocedure Evaluation (Signed)
Anesthesia Post Note  Patient: Stephanie Mcconnell  Procedure(s) Performed: [place]     Patient location during evaluation: PACU Anesthesia Type: General Level of consciousness: awake and alert Pain management: pain level controlled Vital Signs Assessment: post-procedure vital signs reviewed and stable Respiratory status: spontaneous breathing, nonlabored ventilation, respiratory function stable and patient connected to nasal cannula oxygen Cardiovascular status: blood pressure returned to baseline and stable Postop Assessment: no apparent nausea or vomiting Anesthetic complications: no   No notable events documented.  Last Vitals:  Vitals:   04/06/21 1000 04/06/21 1040  BP: 131/89 129/76  Pulse: 63 61  Resp: 16 16  Temp:  36.4 C  SpO2: 100% 100%    Last Pain:  Vitals:   04/06/21 1040  TempSrc: Oral  PainSc: 3                  Barnet Glasgow

## 2021-04-06 NOTE — Op Note (Signed)
04/06/2021  9:26 AM  PATIENT:  Stephanie Mcconnell  50 y.o. female  PRE-OPERATIVE DIAGNOSIS:  MENORRHAGIA  POST-OPERATIVE DIAGNOSIS:  MENORRHAGIA, Fibroids, cystocele, uterine prolapse  PROCEDURE:  Procedure(s): HYSTEROSCOPY WITH NOVASURE (N/A)  SURGEON:  Surgeon(s) and Role:    * Lavonia Drafts, MD - Primary  ANESTHESIA:   general and paracervical block  EBL:  2 mL   BLOOD ADMINISTERED:none  DRAINS: none   LOCAL MEDICATIONS USED:  MARCAINE     SPECIMEN:  Source of Specimen:  endometrial curettings.   DISPOSITION OF SPECIMEN:  PATHOLOGY  COUNTS:  YES  TOURNIQUET:  * No tourniquets in log *  DICTATION: .Note written in [place]: Discharge to home after PACU  PATIENT DISPOSITION:  PACU - hemodynamically stable.   Delay start of Pharmacological VTE agent (>24hrs) due to surgical blood loss or risk of bleeding: not applicable  Complications: none immediate.   Indications: perimenopausal patient with heavy abnormal uterine bleeding not controlled with conservative or pharmacologic measures.   Findings: there was a cystocele and uterine prolapse noted. The endometrium was thickened but after curettage there were no residual polyps or submucosal fibroids noted.    Procedure: The risks, benefits, and alternatives of surgery were explained, understood, and accepted. The consents were signed and all questions were answered. She was taken to the operating room and general anesthesia was applied without complication. She was placed in the dorsal lithotomy position and her vagina and abdomen were prepped and draped in the usual sterile fashion. A bimanual exam revealed a normal size and shape anteverted mobile uterus. Her adnexa were non-enlarged.   A bivalved speculum was placed in the patients' vagina and the anterior lip of the cervix was grasped with a single toothed tenaculum. A paracervical block was performed at 5 and 7 o'clock with 20cc of 0.5% Marcaine.   The  endometrial cavity was sounded to 10.5cm and the endocervical length measured 4cm. A hysteroscope was inserted and the endometrium was noted to be thickened with several polyps.  The ostia on both sides were noted.  The scope was removed and a sharp currete was used to scape the lining of the uterus until a gritty texture was noted throughout.  Specimens were sent to pathology.  The NovaSure device was then inserted and seated using 6.5cm as the cavity length and 4.6cm as the cavity width.  The total activation time was 80 sec at a power of 164w.  The hysteroscope was reinserted and an even burn pattern was noted to the fundus.  The single toothed tenaculum was removed at the end of the case and no bleeding was noted from the cervix.   The patient was extubated and taken to the recovery room in stable condition.  Sponge, lap and instrument counts were correct.  There were no complications.  Azra Abrell L. Harraway-Smith, M.D., [name]

## 2021-04-06 NOTE — Brief Op Note (Signed)
04/06/2021  9:26 AM  PATIENT:  Stephanie Mcconnell  50 y.o. female  PRE-OPERATIVE DIAGNOSIS:  MENORRHAGIA  POST-OPERATIVE DIAGNOSIS:  MENORRHAGIA, Fibroids, cystocele, uterine prolapse  PROCEDURE:  Procedure(s): HYSTEROSCOPY WITH NOVASURE (N/A)  SURGEON:  Surgeon(s) and Role:    * Lavonia Drafts, MD - Primary  ANESTHESIA:   general and paracervical block  EBL:  2 mL   BLOOD ADMINISTERED:none  DRAINS: none   LOCAL MEDICATIONS USED:  MARCAINE     SPECIMEN:  Source of Specimen:  endometrial curettings.   DISPOSITION OF SPECIMEN:  PATHOLOGY  COUNTS:  YES  TOURNIQUET:  * No tourniquets in log *  DICTATION: .Note written in [place]: Discharge to home after PACU  PATIENT DISPOSITION:  PACU - hemodynamically stable.   Delay start of Pharmacological VTE agent (>24hrs) due to surgical blood loss or risk of bleeding: not applicable  Complications: none immediate.   [name], M.D., [name]

## 2021-04-06 NOTE — H&P (Addendum)
Preoperative History and Physical  Stephanie Mcconnell is a 50 y.o. [id number] here for surgical management of Abnormal uterine bleeding.   Proposed surgery: hysteroscopy with dilation and curettage with endometrial ablation using Novasure.   Past Medical History:  Diagnosis Date   Acute meniscal tear of left knee    Anemia    secondary to uterine fibroids with AUB   Bulging disc    lower back.    History of 2019 novel coronavirus disease (COVID-19) 03/04/2021   per pt positive home covid test,  only sore throat that resolved   History of exposure to tuberculosis 2003   per pt treated but never dx'd   Menorrhagia    OA (osteoarthritis)    Seasonal allergic rhinitis    Uterine fibroid    Varicose vein of leg    Past Surgical History:  Procedure Laterality Date   COLONOSCOPY  01/06/2021   dr Henrene Pastor   ENDOSCOPIC VEIN LASER TREATMENT     [facility]   ESSURE TUBAL LIGATION     per pt in GYN office, approx. 2017   KNEE ARTHROSCOPY WITH MEDIAL MENISECTOMY Right 02/10/2021   Procedure: RIGHT KNEE ARTHROSCOPY WITH PARTIAL MEDIAL MENISECTOMY AND DEBRIDEMENT;  Surgeon: Susa Day, MD;  Location: [facility];  Service: Orthopedics;  Laterality: Right;  60 MINS   MASS EXCISION  03/12/2012   LIPOMA-  Procedure: EXCISION MASS;  Surgeon: Madilyn Hook, DO;  Location: [place];  Service: General;  Laterality: Right;    VENTRAL HERNIA REPAIR  03/12/2012   Procedure: LAPAROSCOPIC VENTRAL HERNIA;  Surgeon: Madilyn Hook, DO;  Location: [place];  Service: General;  Laterality: N/A;   OB History     Gravida  2   Para  2   Term  2   Preterm      AB      Living  2      SAB      IAB      Ectopic      Multiple      Live Births             Patient denies any cervical dysplasia or STIs. Medications Prior to Admission  Medication Sig Dispense Refill Last Dose   aspirin 81 MG EC tablet Take 1 tablet (81 mg total) by mouth daily. 14 tablet 1 Past Week   Azelastine HCl 137 MCG/SPRAY SOLN  use 1 spray in each nostril twice a day (Patient taking differently: Place into both nostrils daily.) 90 mL 1 Past Week   docusate sodium (COLACE) 100 MG capsule Take 1 capsule (100 mg total) by mouth 2 (two) times daily as needed for mild constipation. 30 capsule 1 Past Week   Ferrous Sulfate (IRON PO) Take by mouth. PT STATES UNDER NAME BLOOD BUILDER FROM WHOLE FOODS   Past Week   fluticasone (FLONASE) 50 MCG/ACT nasal spray Use 1-2 sprays into both nostrils daily (Patient taking differently: Place into both nostrils every evening.) 48 g 3 Past Week   levocetirizine (XYZAL ALLERGY 24HR) 5 MG tablet Take 1 tablet (5 mg total) by mouth daily. (Patient taking differently: Take 5 mg by mouth every evening.) 90 tablet 3 Past Week   methocarbamol (ROBAXIN) 500 MG tablet TAKE 1 TABLET BY MOUTH EVERY 8 HOURS AS NEEDED. (Patient taking differently: Take 500 mg by mouth every 8 (eight) hours as needed for muscle spasms.) 30 tablet 0 Past Month   montelukast (SINGULAIR) 10 MG tablet Take 1 tablet (10 mg total) by  mouth daily. (Patient taking differently: Take 10 mg by mouth at bedtime.) 90 tablet 3 04/02/2021   Multiple Vitamin (MULTIVITAMIN) capsule Take 1 capsule by mouth daily.   Past Week   Norethindrone Acetate-Ethinyl Estradiol (LOESTRIN 1.5/30, 21,) 1.5-30 MG-MCG tablet Take 1 tablet by mouth daily. **Take continuously, do not take inactive tablets** 63 tablet 4 Unknown   OVER THE COUNTER MEDICATION as needed. AMBEREN   Unknown   oxyCODONE-acetaminophen (PERCOCET) 10-325 MG tablet Take 1 tablet by mouth every 4 (four) hours as needed for pain.   Unknown    No Known Allergies Social History:   reports that she has never smoked. She has never used smokeless tobacco. She reports current alcohol use of about 1.0 standard drink of alcohol per week. She reports that she does not use drugs. Family History  Problem Relation Age of Onset   Colon polyps Neg Hx    Colon cancer Neg Hx    Esophageal cancer Neg Hx     Stomach cancer Neg Hx    Rectal cancer Neg Hx     Review of Systems: Noncontributory  PHYSICAL EXAM: Blood pressure 133/73, pulse 84, temperature 98.5 F (36.9 C), temperature source Oral, resp. rate 18, height 5\' 5"  (1.651 m), weight 131 kg, last menstrual period 03/18/2021, SpO2 100 %. General appearance - alert, well appearing, and in no distress Chest - clear to auscultation, no wheezes, rales or rhonchi, symmetric air entry Heart - normal rate and regular rhythm Abdomen - soft, nontender, nondistended, no masses or organomegaly Pelvic - small and mobile on prior exam. Not repeated today  Extremities - peripheral pulses normal, no pedal edema, no clubbing or cyanosis  Labs: Results for orders placed or performed during the hospital encounter of 04/06/21 (from the past 336 hour(s))  Pregnancy, urine POC   Collection Time: 04/06/21  6:22 AM  Result Value Ref Range   Preg Test, Ur NEGATIVE NEGATIVE   12/09/2020 FINAL MICROSCOPIC DIAGNOSIS:   A. ENDOMETRIUM, BIOPSY:  - Inactive endometrium with breakdown and surface reactive change.  No  malignancy identified.   Imaging Studies: MM 3D SCREEN BREAST BILATERAL  Result Date: 03/25/2021 CLINICAL DATA:  Screening. EXAM: DIGITAL SCREENING BILATERAL MAMMOGRAM WITH TOMOSYNTHESIS AND CAD TECHNIQUE: Bilateral screening digital craniocaudal and mediolateral oblique mammograms were obtained. Bilateral screening digital breast tomosynthesis was performed. The images were evaluated with computer-aided detection. COMPARISON:  Previous exam(s). ACR Breast Density Category c: The breast tissue is heterogeneously dense, which may obscure small masses. FINDINGS: There are no findings suspicious for malignancy. IMPRESSION: No mammographic evidence of malignancy. A result letter of this screening mammogram will be mailed directly to the patient. RECOMMENDATION: Screening mammogram in one year. (Code:SM-B-01Y) BI-RADS CATEGORY  1: Negative.  Electronically Signed   By: Lajean Manes M.D.   On: 03/25/2021 16:32    05/31/2021 CLINICAL DATA:  Patient with abnormal uterine bleeding.   EXAM: TRANSABDOMINAL AND TRANSVAGINAL ULTRASOUND OF PELVIS   TECHNIQUE: Both transabdominal and transvaginal ultrasound examinations of the pelvis were performed. Transabdominal technique was performed for global imaging of the pelvis including uterus, ovaries, adnexal regions, and pelvic cul-de-sac. It was necessary to proceed with endovaginal exam following the transabdominal exam to visualize the endometrium and adnexal structures.   COMPARISON:  None   FINDINGS: Uterus   Measurements: 9.6 x 5.2 x 6.2 cm. Within the anterior uterine body there is a 1.2 x 0.9 x 1.1 cm subserosal fibroid. Within the posterior uterine body there is a 0.8 x 0.6 x  0.8 cm intramural fibroid.   Endometrium   Thickness: 13 mm.  Mildly heterogeneous in echogenicity.   Right ovary   Measurements: 4.2 x 2.3 x 2.9 cm. Normal appearance/no adnexal mass.   Left ovary   Measurements: 1.7 x 2.0 x 1.8 cm. Normal appearance/no adnexal mass.   Other findings   Trace fluid in the pelvis.   IMPRESSION: Endometrium measures 13 mm. If bleeding remains unresponsive to hormonal or medical therapy, sonohysterogram should be considered for focal lesion work-up. (Ref: Radiological Reasoning: Algorithmic Workup of Abnormal Vaginal Bleeding with Endovaginal Sonography and Sonohysterography. AJR 2008; 321:Y24-82)   Probable small fibroids within the uterus.   Assessment: Patient Active Problem List   Diagnosis Date Noted   Reactive airway disease 07/30/2018   Other allergic rhinitis 07/30/2018   Morbid obesity with BMI of 45.0-49.9, adult ([place]) 07/30/2018   Right foot pain 08/12/2017   Acute pharyngitis 02/02/2016   Severe obesity (BMI >= 40) ([place]) 08/08/2015    Plan: Patient will undergo surgical management with hysteroscopy with dilation and curettage with  endometrial ablation using Novasure.   The risks of surgery were discussed in detail with the patient including but not limited to: bleeding which may require transfusion or reoperation; infection which may require antibiotics; injury to surrounding organs which may involve bowel, bladder, ureters ; need for additional procedures including laparoscopy or laparotomy; thromboembolic phenomenon, surgical site problems and other postoperative/anesthesia complications. Likelihood of success in alleviating the patient's condition was discussed. Routine postoperative instructions will be reviewed with the patient and her family in detail after surgery.  The patient concurred with the proposed plan, giving informed written consent for the surgery.  Patient has been NPO since last night she will remain NPO for procedure.  Anesthesia and OR aware.  Preoperative prophylactic antibiotics and SCDs ordered on call to the OR.  To OR when ready.  Stephanie Mcconnell L. Ihor Dow, M.D., [facility] 04/06/2021 8:18 AM

## 2021-04-06 NOTE — Anesthesia Procedure Notes (Signed)
Procedure Name: LMA Insertion Date/Time: 04/06/2021 8:35 AM Performed by: Rogers Blocker, CRNA Pre-anesthesia Checklist: Patient identified, Emergency Drugs available, Suction available and Patient being monitored Patient Re-evaluated:Patient Re-evaluated prior to induction Oxygen Delivery Method: Circle System Utilized Preoxygenation: Pre-oxygenation with 100% oxygen Induction Type: IV induction Ventilation: Mask ventilation without difficulty LMA: LMA inserted LMA Size: 4.0 Number of attempts: 1 Placement Confirmation: positive ETCO2 Tube secured with: Tape Dental Injury: Teeth and Oropharynx as per pre-operative assessment

## 2021-04-06 NOTE — Transfer of Care (Signed)
Immediate Anesthesia Transfer of Care Note  Patient: [name]  Procedure(s) Performed: HYSTEROSCOPY WITH NOVASURE  Patient Location: PACU  Anesthesia Type:General  Level of Consciousness: awake, alert  and patient cooperative  Airway & Oxygen Therapy: Patient Spontanous Breathing and Patient connected to face mask oxygen  Post-op Assessment: Report given to RN and Post -op Vital signs reviewed and stable  Post vital signs: Reviewed and stable  Last Vitals:  Vitals Value Taken Time  BP 143/88 04/06/21 0916  Temp    Pulse 95 04/06/21 0913  Resp 20 04/06/21 0918  SpO2 100 % 04/06/21 0913  Vitals shown include unvalidated device data.  Last Pain:  Vitals:   04/06/21 0629  TempSrc: Oral         Complications: No notable events documented.

## 2021-04-07 ENCOUNTER — Encounter (HOSPITAL_BASED_OUTPATIENT_CLINIC_OR_DEPARTMENT_OTHER): Payer: Self-pay | Admitting: Obstetrics & Gynecology

## 2021-04-07 LAB — SURGICAL PATHOLOGY

## 2021-04-12 ENCOUNTER — Other Ambulatory Visit (HOSPITAL_BASED_OUTPATIENT_CLINIC_OR_DEPARTMENT_OTHER): Payer: Self-pay

## 2021-04-12 MED ORDER — AMOXICILLIN-POT CLAVULANATE 875-125 MG PO TABS
ORAL_TABLET | ORAL | 0 refills | Status: DC
  Filled 2021-04-12: qty 10, 5d supply, fill #0

## 2021-04-13 NOTE — Progress Notes (Signed)
DUE TO COVID-19 ONLY ONE VISITOR IS ALLOWED TO COME WITH YOU AND STAY IN THE WAITING ROOM ONLY DURING PRE OP AND PROCEDURE DAY OF SURGERY. THE 1 VISITOR  MAY VISIT WITH YOU AFTER SURGERY IN YOUR PRIVATE ROOM DURING VISITING HOURS ONLY!  YOU NEED TO HAVE A COVID 19 TEST ON__ _____ @_______ , THIS TEST MUST BE DONE BEFORE SURGERY,  COVID TESTING SITE [address] [place] [address], IT IS ON THE RIGHT GOING OUT [address] APPROXIMATELY  2 MINUTES PAST [employer] ON THE RIGHT. ONCE YOUR COVID TEST IS COMPLETED,  PLEASE BEGIN THE QUARANTINE INSTRUCTIONS AS OUTLINED IN YOUR HANDOUT.                Stephanie Mcconnell  04/13/2021   Your procedure is scheduled on:  04/23/21   Report to [facility] Main  Entrance   Report to admitting at  1030   AM     Call this number if you have problems the morning of surgery [phone]    Remember: Do not eat food , candy gum or mints :After Midnight. You may have clear liquids from midnight until  0930am    CLEAR LIQUID DIET   Foods Allowed                                                                       Coffee and tea, regular and decaf                              Plain Jell-O any favor except red or purple                                            Fruit ices (not with fruit pulp)                                      Iced Popsicles                                     Carbonated beverages, regular and diet                                    Cranberry, grape and apple juices Sports drinks like Gatorade Lightly seasoned clear broth or consume(fat free) Sugar, honey syrup   _____________________________________________________________________    BRUSH YOUR TEETH MORNING OF SURGERY AND RINSE YOUR MOUTH OUT, NO CHEWING GUM CANDY OR MINTS.     Take these medicines the morning of surgery with A SIP OF WATER: xyzal   DO NOT TAKE ANY DIABETIC MEDICATIONS DAY OF YOUR SURGERY                               You may  not have any metal on your  body including hair pins and              piercings  Do not wear jewelry, make-up, lotions, powders or perfumes, deodorant             Do not wear nail polish on your fingernails.  Do not shave  48 hours prior to surgery.              Men may shave face and neck.   Do not bring valuables to the hospital. [place].  Contacts, dentures or bridgework may not be worn into surgery.  Leave suitcase in the car. After surgery it may be brought to your room.     Patients discharged the day of surgery will not be allowed to drive home. IF YOU ARE HAVING SURGERY AND GOING HOME THE SAME DAY, YOU MUST HAVE AN ADULT TO DRIVE YOU HOME AND BE WITH YOU FOR 24 HOURS. YOU MAY GO HOME BY TAXI OR UBER OR ORTHERWISE, BUT AN ADULT MUST ACCOMPANY YOU HOME AND STAY WITH YOU FOR 24 HOURS.  Name and phone number of your driver:  Special Instructions: N/A              Please read over the following fact sheets you were given: _____________________________________________________________________  [facility] - Preparing for Surgery Before surgery, you can play an important role.  Because skin is not sterile, your skin needs to be as free of germs as possible.  You can reduce the number of germs on your skin by washing with CHG (chlorahexidine gluconate) soap before surgery.  CHG is an antiseptic cleaner which kills germs and bonds with the skin to continue killing germs even after washing. Please DO NOT use if you have an allergy to CHG or antibacterial soaps.  If your skin becomes reddened/irritated stop using the CHG and inform your nurse when you arrive at Short Stay. Do not shave (including legs and underarms) for at least 48 hours prior to the first CHG shower.  You may shave your face/neck. Please follow these instructions carefully:  1.  Shower with CHG Soap the night before surgery and the  morning of Surgery.  2.  If you choose to wash  your hair, wash your hair first as usual with your  normal  shampoo.  3.  After you shampoo, rinse your hair and body thoroughly to remove the  shampoo.                           4.  Use CHG as you would any other liquid soap.  You can apply chg directly  to the skin and wash                       Gently with a scrungie or clean washcloth.  5.  Apply the CHG Soap to your body ONLY FROM THE NECK DOWN.   Do not use on face/ open                           Wound or open sores. Avoid contact with eyes, ears mouth and genitals (private parts).                       Wash face,  Development worker, international aid (private  parts) with your normal soap.             6.  Wash thoroughly, paying special attention to the area where your surgery  will be performed.  7.  Thoroughly rinse your body with warm water from the neck down.  8.  DO NOT shower/wash with your normal soap after using and rinsing off  the CHG Soap.                9.  Pat yourself dry with a clean towel.            10.  Wear clean pajamas.            11.  Place clean sheets on your bed the night of your first shower and do not  sleep with pets. Day of Surgery : Do not apply any lotions/deodorants the morning of surgery.  Please wear clean clothes to the hospital/surgery center.  FAILURE TO FOLLOW THESE INSTRUCTIONS MAY RESULT IN THE CANCELLATION OF YOUR SURGERY PATIENT SIGNATURE_________________________________  NURSE SIGNATURE__________________________________  ________________________________________________________________________

## 2021-04-14 ENCOUNTER — Ambulatory Visit: Payer: Self-pay | Admitting: Orthopedic Surgery

## 2021-04-15 ENCOUNTER — Encounter (HOSPITAL_COMMUNITY)
Admission: RE | Admit: 2021-04-15 | Discharge: 2021-04-15 | Disposition: A | Discharge status: Home / Self Care | Payer: No Typology Code available for payment source | Source: Ambulatory Visit | Attending: Specialist | Admitting: Specialist

## 2021-04-15 ENCOUNTER — Other Ambulatory Visit: Payer: Self-pay

## 2021-04-15 ENCOUNTER — Encounter (HOSPITAL_COMMUNITY): Payer: Self-pay

## 2021-04-15 ENCOUNTER — Ambulatory Visit: Payer: Self-pay | Admitting: Orthopedic Surgery

## 2021-04-15 DIAGNOSIS — Z01812 Encounter for preprocedural laboratory examination: Secondary | ICD-10-CM | POA: Insufficient documentation

## 2021-04-15 LAB — CBC
HCT: 37.5 % (ref 36.0–46.0)
Hemoglobin: 11.3 g/dL — ABNORMAL LOW (ref 12.0–15.0)
MCH: 22.6 pg — ABNORMAL LOW (ref 26.0–34.0)
MCHC: 30.1 g/dL (ref 30.0–36.0)
MCV: 74.9 fL — ABNORMAL LOW (ref 80.0–100.0)
Platelets: 320 10*3/uL (ref 150–400)
RBC: 5.01 MIL/uL (ref 3.87–5.11)
RDW: 20.2 % — ABNORMAL HIGH (ref 11.5–15.5)
WBC: 5.9 10*3/uL (ref 4.0–10.5)
nRBC: 0 % (ref 0.0–0.2)

## 2021-04-15 NOTE — H&P (Signed)
Stephanie Mcconnell is an 50 y.o. female.   Chief Complaint: left knee pain HPI: For location (lower extremity), patient reports knee pain bilateral. For associated symptoms, patient reports swelling. For reason for visit, patient reports diagnositc results (bilateral knee mris). For severity, patient reports pain level 6/10. For timing, patient reports constant.  Past Medical History:  Diagnosis Date   Acute meniscal tear of left knee    Anemia    secondary to uterine fibroids with AUB   Bulging disc    lower back.    History of 2019 novel coronavirus disease (COVID-19) 03/04/2021   per pt positive home covid test,  only sore throat that resolved   History of exposure to tuberculosis 2003   per pt treated but never dx'd   Menorrhagia    OA (osteoarthritis)    Seasonal allergic rhinitis    Uterine fibroid    Varicose vein of leg     Past Surgical History:  Procedure Laterality Date   COLONOSCOPY  01/06/2021   dr Henrene Pastor   ENDOSCOPIC VEIN LASER TREATMENT     [facility]   ESSURE TUBAL LIGATION     per pt in GYN office, approx. 2017   HYSTEROSCOPY WITH NOVASURE N/A 04/06/2021   Procedure: HYSTEROSCOPY WITH NOVASURE;  Surgeon: Lavonia Drafts, MD;  Location: [place];  Service: Gynecology;  Laterality: N/A;   KNEE ARTHROSCOPY WITH MEDIAL MENISECTOMY Right 02/10/2021   Procedure: RIGHT KNEE ARTHROSCOPY WITH PARTIAL MEDIAL MENISECTOMY AND DEBRIDEMENT;  Surgeon: Susa Day, MD;  Location[facility];  Service: Orthopedics;  Laterality: Right;  60 MINS   MASS EXCISION  03/12/2012   LIPOMA-  Procedure: EXCISION MASS;  Surgeon: Madilyn Hook, DO;  Location: [place];  Service: General;  Laterality: Right;    VENTRAL HERNIA REPAIR  03/12/2012   Procedure: LAPAROSCOPIC VENTRAL HERNIA;  Surgeon: Madilyn Hook, DO;  Location: [place] [facility];  Service: General;  Laterality: N/A;    Family History  Problem Relation Age of Onset   Colon polyps Neg Hx    Colon cancer Neg Hx     Esophageal cancer Neg Hx    Stomach cancer Neg Hx    Rectal cancer Neg Hx    Social History:  reports that she has never smoked. She has never used smokeless tobacco. She reports current alcohol use of about 1.0 standard drink of alcohol per week. She reports that she does not use drugs.  Allergies: No Known Allergies  (Not in a hospital admission)   Results for orders placed or performed during the hospital encounter of 04/15/21 (from the past 48 hour(s))  CBC per protocol     Status: Abnormal   Collection Time: 04/15/21 10:21 AM  Result Value Ref Range   WBC 5.9 4.0 - 10.5 K/uL   RBC 5.01 3.87 - 5.11 MIL/uL   Hemoglobin 11.3 (L) 12.0 - 15.0 g/dL   HCT 37.5 36.0 - 46.0 %   MCV 74.9 (L) 80.0 - 100.0 fL   MCH 22.6 (L) 26.0 - 34.0 pg   MCHC 30.1 30.0 - 36.0 g/dL   RDW 20.2 (H) 11.5 - 15.5 %   Platelets 320 150 - 400 K/uL   nRBC 0.0 0.0 - 0.2 %    Comment: Performed at [facility], [place] [address] [place], [place] [address]   No results found.  Review of Systems  Constitutional: Negative.   HENT: Negative.    Eyes: Negative.   Respiratory: Negative.    Cardiovascular:  Negative.   Gastrointestinal: Negative.   Endocrine: Negative.   Genitourinary: Negative.   Musculoskeletal:  Positive for arthralgias and joint swelling.  Skin: Negative.   Neurological: Negative.   Psychiatric/Behavioral: Negative.     Last menstrual period 04/15/2021. Physical Exam Constitutional:      Appearance: She is obese.  HENT:     Head: Normocephalic and atraumatic.     Right Ear: External ear normal.     Left Ear: External ear normal.     Nose: Nose normal.     Mouth/Throat:     Pharynx: Oropharynx is clear.  Eyes:     Conjunctiva/sclera: Conjunctivae normal.  Cardiovascular:     Rate and Rhythm: Normal rate and regular rhythm.     Pulses: Normal pulses.  Pulmonary:     Effort: Pulmonary effort is normal.     Breath sounds: Normal breath sounds.  Abdominal:      General: Bowel sounds are normal.  Musculoskeletal:     Cervical back: Normal range of motion.     Comments: Left knee tender medial joint line. Positive McMurrays. Limited ROM. No calf pain or sign of DVT  Skin:    General: Skin is warm and dry.    MRI of the left knee demonstrates a high-grade root root equivalent radial tear the posterior horn the medial meniscus 3 mm extrusion chondral fissuring of the lateral trochlea. Medial joint line soft tissue edema pes anserinus bursitis  Assessment/Plan Left symptomatic high-grade root tear radial horn left knee  Plan:  Given her mechanical symptoms failing conservative treatment on the left we discussed knee arthroscopy partial medial meniscectomy  I discussed the risk and benefits of knee arthroscopy including no changes in their symptoms worsening in their symptoms DVT, PE, anesthetic complications etc. Surgical possibilities include chondroplasty, microfracture, partial meniscectomy, plica excision etc. We also discussed the possible need for repeat arthroscopy in the future as well as possible continued treatment including corticosteroid injections and possible Visco supplementation. In addition we discussed the possibility of even eventually required a total knee replacement if significant arthritis encountered. Also indicating that it is an outpatient procedure. 1-2 days on crutches. Postoperative DVT prophylaxis with aspirin if tolerated. Follow-up in the office in 2 weeks following the surgery. Possible consideration of formal of supervised physical therapy as well. No history of DVT chest pain heart attack or MRSA.  She prefers a walker afterwards.  Plan Left knee arthroscopy, partial medial meniscectomy, debridement  Cecilie Kicks, PA-C for Dr. Tonita Cong 04/15/2021, 2:36 PM

## 2021-04-15 NOTE — H&P (View-Only) (Signed)
Stephanie Mcconnell is an 50 y.o. female.   Chief Complaint: left knee pain HPI: For location (lower extremity), patient reports knee pain bilateral. For associated symptoms, patient reports swelling. For reason for visit, patient reports diagnositc results (bilateral knee mris). For severity, patient reports pain level 6/10. For timing, patient reports constant.  Past Medical History:  Diagnosis Date   Acute meniscal tear of left knee    Anemia    secondary to uterine fibroids with AUB   Bulging disc    lower back.    History of 2019 novel coronavirus disease (COVID-19) 03/04/2021   per pt positive home covid test,  only sore throat that resolved   History of exposure to tuberculosis 2003   per pt treated but never dx'd   Menorrhagia    OA (osteoarthritis)    Seasonal allergic rhinitis    Uterine fibroid    Varicose vein of leg     Past Surgical History:  Procedure Laterality Date   COLONOSCOPY  01/06/2021   dr Henrene Pastor   ENDOSCOPIC VEIN LASER TREATMENT     [facility]   ESSURE TUBAL LIGATION     per pt in GYN office, approx. 2017   HYSTEROSCOPY WITH NOVASURE N/A 04/06/2021   Procedure: HYSTEROSCOPY WITH NOVASURE;  Surgeon: Lavonia Drafts, MD;  Location: [facility];  Service: Gynecology;  Laterality: N/A;   KNEE ARTHROSCOPY WITH MEDIAL MENISECTOMY Right 02/10/2021   Procedure: RIGHT KNEE ARTHROSCOPY WITH PARTIAL MEDIAL MENISECTOMY AND DEBRIDEMENT;  Surgeon: Susa Day, MD;  Location: [facility];  Service: Orthopedics;  Laterality: Right;  60 MINS   MASS EXCISION  03/12/2012   LIPOMA-  Procedure: EXCISION MASS;  Surgeon: Madilyn Hook, DO;  Location: [facility];  Service: General;  Laterality: Right;    VENTRAL HERNIA REPAIR  03/12/2012   Procedure: LAPAROSCOPIC VENTRAL HERNIA;  Surgeon: Madilyn Hook, DO;  Location: [facility];  Service: General;  Laterality: N/A;    Family History  Problem Relation Age of Onset   Colon polyps Neg Hx    Colon cancer Neg Hx     Esophageal cancer Neg Hx    Stomach cancer Neg Hx    Rectal cancer Neg Hx    Social History:  reports that she has never smoked. She has never used smokeless tobacco. She reports current alcohol use of about 1.0 standard drink of alcohol per week. She reports that she does not use drugs.  Allergies: No Known Allergies  (Not in a hospital admission)   Results for orders placed or performed during the hospital encounter of 04/15/21 (from the past 48 hour(s))  CBC per protocol     Status: Abnormal   Collection Time: 04/15/21 10:21 AM  Result Value Ref Range   WBC 5.9 4.0 - 10.5 K/uL   RBC 5.01 3.87 - 5.11 MIL/uL   Hemoglobin 11.3 (L) 12.0 - 15.0 g/dL   HCT 37.5 36.0 - 46.0 %   MCV 74.9 (L) 80.0 - 100.0 fL   MCH 22.6 (L) 26.0 - 34.0 pg   MCHC 30.1 30.0 - 36.0 g/dL   RDW 20.2 (H) 11.5 - 15.5 %   Platelets 320 150 - 400 K/uL   nRBC 0.0 0.0 - 0.2 %    Comment: Performed at [facility], [place] [address], [place], [place] [address]   No results found.  Review of Systems  Constitutional: Negative.   HENT: Negative.    Eyes: Negative.   Respiratory: Negative.    Cardiovascular:  Negative.   Gastrointestinal: Negative.   Endocrine: Negative.   Genitourinary: Negative.   Musculoskeletal:  Positive for arthralgias and joint swelling.  Skin: Negative.   Neurological: Negative.   Psychiatric/Behavioral: Negative.     Last menstrual period 04/15/2021. Physical Exam Constitutional:      Appearance: She is obese.  HENT:     Head: Normocephalic and atraumatic.     Right Ear: External ear normal.     Left Ear: External ear normal.     Nose: Nose normal.     Mouth/Throat:     Pharynx: Oropharynx is clear.  Eyes:     Conjunctiva/sclera: Conjunctivae normal.  Cardiovascular:     Rate and Rhythm: Normal rate and regular rhythm.     Pulses: Normal pulses.  Pulmonary:     Effort: Pulmonary effort is normal.     Breath sounds: Normal breath sounds.  Abdominal:      General: Bowel sounds are normal.  Musculoskeletal:     Cervical back: Normal range of motion.     Comments: Left knee tender medial joint line. Positive McMurrays. Limited ROM. No calf pain or sign of DVT  Skin:    General: Skin is warm and dry.    MRI of the left knee demonstrates a high-grade root root equivalent radial tear the posterior horn the medial meniscus 3 mm extrusion chondral fissuring of the lateral trochlea. Medial joint line soft tissue edema pes anserinus bursitis  Assessment/Plan Left symptomatic high-grade root tear radial horn left knee  Plan:  Given her mechanical symptoms failing conservative treatment on the left we discussed knee arthroscopy partial medial meniscectomy  I discussed the risk and benefits of knee arthroscopy including no changes in their symptoms worsening in their symptoms DVT, PE, anesthetic complications etc. Surgical possibilities include chondroplasty, microfracture, partial meniscectomy, plica excision etc. We also discussed the possible need for repeat arthroscopy in the future as well as possible continued treatment including corticosteroid injections and possible Visco supplementation. In addition we discussed the possibility of even eventually required a total knee replacement if significant arthritis encountered. Also indicating that it is an outpatient procedure. 1-2 days on crutches. Postoperative DVT prophylaxis with aspirin if tolerated. Follow-up in the office in 2 weeks following the surgery. Possible consideration of formal of supervised physical therapy as well. No history of DVT chest pain heart attack or MRSA.  She prefers a walker afterwards.  Plan Left knee arthroscopy, partial medial meniscectomy, debridement  Cecilie Kicks, PA-C for Dr. Tonita Cong 04/15/2021, 2:36 PM

## 2021-04-15 NOTE — Progress Notes (Addendum)
        Anesthesia Review:  PCP: DR  Cardiologist : Chest x-ray : EKG : none  Echo : Stress test: Cardiac Cath :  Activity level: can do a flight of stairs without difficulty  Sleep Study/ CPAP : none  Fasting Blood Sugar :      / Checks Blood Sugar -- times a day:   Blood Thinner/ Instructions /Last Dose: ASA / Instructions/ Last Dose :  04/06/2021- hysteroscopy   Rihgt knee scope- 02/10/21

## 2021-04-23 ENCOUNTER — Encounter (HOSPITAL_COMMUNITY)
Admission: RE | Disposition: A | Discharge status: Home / Self Care | Payer: Self-pay | Source: Ambulatory Visit | Attending: Specialist

## 2021-04-23 ENCOUNTER — Ambulatory Visit (HOSPITAL_COMMUNITY): Payer: No Typology Code available for payment source | Admitting: Anesthesiology

## 2021-04-23 ENCOUNTER — Other Ambulatory Visit (HOSPITAL_COMMUNITY): Payer: Self-pay

## 2021-04-23 ENCOUNTER — Ambulatory Visit (HOSPITAL_COMMUNITY)
Admission: RE | Admit: 2021-04-23 | Discharge: 2021-04-23 | Disposition: A | Discharge status: Home / Self Care | Payer: No Typology Code available for payment source | Source: Ambulatory Visit | Attending: Specialist | Admitting: Specialist

## 2021-04-23 ENCOUNTER — Other Ambulatory Visit: Payer: Self-pay

## 2021-04-23 ENCOUNTER — Encounter (HOSPITAL_COMMUNITY): Payer: Self-pay | Admitting: Specialist

## 2021-04-23 DIAGNOSIS — M1712 Unilateral primary osteoarthritis, left knee: Secondary | ICD-10-CM | POA: Diagnosis not present

## 2021-04-23 DIAGNOSIS — Z8616 Personal history of COVID-19: Secondary | ICD-10-CM | POA: Insufficient documentation

## 2021-04-23 DIAGNOSIS — S83242A Other tear of medial meniscus, current injury, left knee, initial encounter: Secondary | ICD-10-CM | POA: Diagnosis present

## 2021-04-23 DIAGNOSIS — Z6841 Body Mass Index (BMI) 40.0 and over, adult: Secondary | ICD-10-CM | POA: Insufficient documentation

## 2021-04-23 DIAGNOSIS — X58XXXA Exposure to other specified factors, initial encounter: Secondary | ICD-10-CM | POA: Insufficient documentation

## 2021-04-23 HISTORY — PX: KNEE ARTHROSCOPY WITH MEDIAL MENISECTOMY: SHX5651

## 2021-04-23 LAB — CBC
HCT: 35.7 % — ABNORMAL LOW (ref 36.0–46.0)
Hemoglobin: 10.9 g/dL — ABNORMAL LOW (ref 12.0–15.0)
MCH: 22.9 pg — ABNORMAL LOW (ref 26.0–34.0)
MCHC: 30.5 g/dL (ref 30.0–36.0)
MCV: 74.8 fL — ABNORMAL LOW (ref 80.0–100.0)
Platelets: 386 10*3/uL (ref 150–400)
RBC: 4.77 MIL/uL (ref 3.87–5.11)
RDW: 19.4 % — ABNORMAL HIGH (ref 11.5–15.5)
WBC: 8.7 10*3/uL (ref 4.0–10.5)
nRBC: 0 % (ref 0.0–0.2)

## 2021-04-23 LAB — BASIC METABOLIC PANEL
Anion gap: 9 (ref 5–15)
BUN: 11 mg/dL (ref 6–20)
CO2: 25 mmol/L (ref 22–32)
Calcium: 9 mg/dL (ref 8.9–10.3)
Chloride: 105 mmol/L (ref 98–111)
Creatinine, Ser: 0.59 mg/dL (ref 0.44–1.00)
GFR, Estimated: >60 mL/min (ref >60)
Glucose, Bld: 117 mg/dL — ABNORMAL HIGH (ref 70–99)
Potassium: 3.9 mmol/L (ref 3.5–5.1)
Sodium: 139 mmol/L (ref 135–145)

## 2021-04-23 LAB — PREGNANCY, URINE: Preg Test, Ur: NEGATIVE

## 2021-04-23 SURGERY — ARTHROSCOPY, KNEE, WITH MEDIAL MENISCECTOMY
Anesthesia: General | Site: Knee | Laterality: Left

## 2021-04-23 MED ORDER — MEPERIDINE HCL 50 MG/ML IJ SOLN: 6.2500 mg | INTRAMUSCULAR | Status: DC | PRN

## 2021-04-23 MED ORDER — LACTATED RINGERS IV SOLN: INTRAVENOUS | Status: DC

## 2021-04-23 MED ORDER — PROPOFOL 10 MG/ML IV BOLUS
INTRAVENOUS | Status: DC | PRN
  Administered 2021-04-23: 200 mg via INTRAVENOUS

## 2021-04-23 MED ORDER — MIDAZOLAM HCL 2 MG/2ML IJ SOLN
INTRAMUSCULAR | Status: AC
  Filled 2021-04-23: qty 2

## 2021-04-23 MED ORDER — DEXAMETHASONE SODIUM PHOSPHATE 10 MG/ML IJ SOLN
INTRAMUSCULAR | Status: AC
  Filled 2021-04-23: qty 1

## 2021-04-23 MED ORDER — CEFAZOLIN IN SODIUM CHLORIDE 3-0.9 GM/100ML-% IV SOLN
3.0000 g | INTRAVENOUS | Status: AC
  Administered 2021-04-23: 3 g via INTRAVENOUS
  Filled 2021-04-23: qty 100

## 2021-04-23 MED ORDER — FENTANYL CITRATE (PF) 100 MCG/2ML IJ SOLN
INTRAMUSCULAR | Status: AC
  Administered 2021-04-23: 50 ug via INTRAVENOUS
  Filled 2021-04-23: qty 2

## 2021-04-23 MED ORDER — MIDAZOLAM HCL 5 MG/5ML IJ SOLN
INTRAMUSCULAR | Status: DC | PRN
  Administered 2021-04-23: 2 mg via INTRAVENOUS

## 2021-04-23 MED ORDER — ACETAMINOPHEN 160 MG/5ML PO SOLN: 325.0000 mg | ORAL | Status: DC | PRN

## 2021-04-23 MED ORDER — BUPIVACAINE-EPINEPHRINE (PF) 0.5% -1:200000 IJ SOLN
INTRAMUSCULAR | Status: AC
  Filled 2021-04-23: qty 30

## 2021-04-23 MED ORDER — DEXAMETHASONE SODIUM PHOSPHATE 10 MG/ML IJ SOLN
INTRAMUSCULAR | Status: DC | PRN
  Administered 2021-04-23: 10 mg via INTRAVENOUS

## 2021-04-23 MED ORDER — FENTANYL CITRATE (PF) 100 MCG/2ML IJ SOLN: 25.0000 ug | INTRAMUSCULAR | Status: DC | PRN

## 2021-04-23 MED ORDER — ACETAMINOPHEN 325 MG PO TABS: 325.0000 mg | ORAL_TABLET | ORAL | Status: DC | PRN

## 2021-04-23 MED ORDER — LACTATED RINGERS IR SOLN
Status: DC | PRN
  Administered 2021-04-23: 6000 mL

## 2021-04-23 MED ORDER — EPINEPHRINE PF 1 MG/ML IJ SOLN
INTRAMUSCULAR | Status: DC | PRN
  Administered 2021-04-23: 2 mg

## 2021-04-23 MED ORDER — ORAL CARE MOUTH RINSE: 15.0000 mL | Freq: Once | OROMUCOSAL | Status: AC

## 2021-04-23 MED ORDER — LIDOCAINE 2% (20 MG/ML) 5 ML SYRINGE
INTRAMUSCULAR | Status: AC
  Filled 2021-04-23: qty 5

## 2021-04-23 MED ORDER — CHLORHEXIDINE GLUCONATE 0.12 % MT SOLN
15.0000 mL | Freq: Once | OROMUCOSAL | Status: AC
  Administered 2021-04-23: 15 mL via OROMUCOSAL

## 2021-04-23 MED ORDER — ONDANSETRON HCL 4 MG/2ML IJ SOLN
INTRAMUSCULAR | Status: AC
  Filled 2021-04-23: qty 2

## 2021-04-23 MED ORDER — ONDANSETRON HCL 4 MG/2ML IJ SOLN: 4.0000 mg | Freq: Once | INTRAMUSCULAR | Status: DC | PRN

## 2021-04-23 MED ORDER — ONDANSETRON HCL 4 MG/2ML IJ SOLN
INTRAMUSCULAR | Status: DC | PRN
Start: 2021-04-23 — End: 2021-04-23
  Administered 2021-04-23: 4 mg via INTRAVENOUS

## 2021-04-23 MED ORDER — OXYCODONE-ACETAMINOPHEN 5-325 MG PO TABS
1.0000 | ORAL_TABLET | ORAL | 0 refills | Status: DC | PRN
  Filled 2021-04-23: qty 30, 5d supply, fill #0

## 2021-04-23 MED ORDER — FENTANYL CITRATE (PF) 250 MCG/5ML IJ SOLN
INTRAMUSCULAR | Status: AC
  Filled 2021-04-23: qty 5

## 2021-04-23 MED ORDER — OXYCODONE HCL 5 MG PO TABS: 5.0000 mg | ORAL_TABLET | Freq: Once | ORAL | Status: DC | PRN

## 2021-04-23 MED ORDER — OXYCODONE HCL 5 MG/5ML PO SOLN: 5.0000 mg | Freq: Once | ORAL | Status: DC | PRN

## 2021-04-23 MED ORDER — EPINEPHRINE PF 1 MG/ML IJ SOLN
INTRAMUSCULAR | Status: AC
  Filled 2021-04-23: qty 2

## 2021-04-23 MED ORDER — FENTANYL CITRATE (PF) 100 MCG/2ML IJ SOLN
INTRAMUSCULAR | Status: DC | PRN
  Administered 2021-04-23 (×5): 50 ug via INTRAVENOUS

## 2021-04-23 MED ORDER — BUPIVACAINE-EPINEPHRINE 0.5% -1:200000 IJ SOLN
INTRAMUSCULAR | Status: DC | PRN
  Administered 2021-04-23: 24 mL

## 2021-04-23 MED ORDER — LIDOCAINE 2% (20 MG/ML) 5 ML SYRINGE
INTRAMUSCULAR | Status: DC | PRN
Start: 2021-04-23 — End: 2021-04-23
  Administered 2021-04-23: 100 mg via INTRAVENOUS

## 2021-04-23 MED ORDER — PROPOFOL 10 MG/ML IV BOLUS
INTRAVENOUS | Status: AC
  Filled 2021-04-23: qty 20

## 2021-04-23 SURGICAL SUPPLY — 27 items
BAG COUNTER SPONGE SURGICOUNT (BAG) ×2 IMPLANT
BAG SPNG CNTER NS LX DISP (BAG) ×1
BLADE SHAVER TORPEDO 4X13 (MISCELLANEOUS) ×1 IMPLANT
BNDG CMPR MED 10X6 ELC LF (GAUZE/BANDAGES/DRESSINGS) ×1
BNDG CONFORM 4 STRL LF (GAUZE/BANDAGES/DRESSINGS) ×1 IMPLANT
BNDG ELASTIC 6X10 VLCR STRL LF (GAUZE/BANDAGES/DRESSINGS) ×1 IMPLANT
BNDG ELASTIC 6X5.8 VLCR STR LF (GAUZE/BANDAGES/DRESSINGS) ×2 IMPLANT
BOOTIES KNEE HIGH SLOAN (MISCELLANEOUS) ×4 IMPLANT
COVER SURGICAL LIGHT HANDLE (MISCELLANEOUS) ×2 IMPLANT
DRSG PAD ABDOMINAL 8X10 ST (GAUZE/BANDAGES/DRESSINGS) ×2 IMPLANT
DURAPREP 26ML APPLICATOR (WOUND CARE) ×2 IMPLANT
GAUZE SPONGE 4X4 12PLY STRL (GAUZE/BANDAGES/DRESSINGS) ×2 IMPLANT
GLOVE SURG POLYISO LF SZ7.5 (GLOVE) ×2 IMPLANT
GLOVE SURG POLYISO LF SZ8 (GLOVE) ×2 IMPLANT
GLOVE SURG UNDER POLY LF SZ7.5 (GLOVE) ×2 IMPLANT
GOWN STRL REUS W/TWL XL LVL3 (GOWN DISPOSABLE) ×4 IMPLANT
KIT TURNOVER KIT A (KITS) ×2 IMPLANT
MANIFOLD NEPTUNE II (INSTRUMENTS) ×2 IMPLANT
PACK ARTHROSCOPY WL (CUSTOM PROCEDURE TRAY) ×2 IMPLANT
PADDING CAST COTTON 6X4 STRL (CAST SUPPLIES) ×2 IMPLANT
PENCIL SMOKE EVACUATOR (MISCELLANEOUS) IMPLANT
SUT ETHILON 4 0 PS 2 18 (SUTURE) ×2 IMPLANT
TOWEL OR 17X26 10 PK STRL BLUE (TOWEL DISPOSABLE) ×2 IMPLANT
TUBING ARTHROSCOPY IRRIG 16FT (MISCELLANEOUS) ×2 IMPLANT
WAND APOLLORF SJ50 AR-9845 (SURGICAL WAND) IMPLANT
WIPE CHG CHLORHEXIDINE 2% (PERSONAL CARE ITEMS) ×2 IMPLANT
WRAP KNEE MAXI GEL POST OP (GAUZE/BANDAGES/DRESSINGS) ×2 IMPLANT

## 2021-04-23 NOTE — Anesthesia Procedure Notes (Signed)
Procedure Name: LMA Insertion Date/Time: 04/23/2021 12:42 PM Performed by: Lollie Sails, CRNA Pre-anesthesia Checklist: Patient identified, Emergency Drugs available, Suction available, Patient being monitored and Timeout performed Patient Re-evaluated:Patient Re-evaluated prior to induction Oxygen Delivery Method: Circle system utilized Preoxygenation: Pre-oxygenation with 100% oxygen Induction Type: IV induction Ventilation: Mask ventilation without difficulty LMA: LMA inserted LMA Size: 4.0 Number of attempts: 1 Placement Confirmation: positive ETCO2 and breath sounds checked- equal and bilateral Tube secured with: Tape Dental Injury: Teeth and Oropharynx as per pre-operative assessment

## 2021-04-23 NOTE — Brief Op Note (Signed)
04/23/2021  1:28 PM  PATIENT:  Stephanie Mcconnell  50 y.o. female  PRE-OPERATIVE DIAGNOSIS:  Left knee medial meniscal tear, degenerative joint disease  POST-OPERATIVE DIAGNOSIS:  Left knee medial meniscal tear, degenerative joint disease  PROCEDURE:  Procedure(s): KNEE ARTHROSCOPY WITH PARTIAL MEDIAL MENISECTOMY AND DEBRIDEMENT (Left)  SURGEON:  Surgeon(s) and Role:    Susa Day, MD - Primary  PHYSICIAN ASSISTANT:   ASSISTANTS: [place]   ANESTHESIA:   general  EBL:  5 mL   BLOOD ADMINISTERED:none  DRAINS: none   LOCAL MEDICATIONS USED:  MARCAINE     SPECIMEN:  No Specimen  DISPOSITION OF SPECIMEN:  N/A  COUNTS:  YES  TOURNIQUET:  * No tourniquets in log *  DICTATION: .Other Dictation: Dictation Number [id number]  PLAN OF CARE: Discharge to home after PACU  PATIENT DISPOSITION:  PACU - hemodynamically stable.   Delay start of Pharmacological VTE agent (>24hrs) due to surgical blood loss or risk of bleeding: no

## 2021-04-23 NOTE — Op Note (Signed)
NAMEJALEEN, Stephanie Mcconnell MEDICAL RECORD NO: [id number] ACCOUNT NO: [id number] DATE OF BIRTH: 1971-09-27 FACILITY: [facility] LOCATION: [facility] PHYSICIAN: Johnn Hai, MD  Operative Report   DATE OF PROCEDURE: 04/23/2021  PREOPERATIVE DIAGNOSIS:  Meniscus tear, degenerative joint disease, left knee.  POSTOPERATIVE DIAGNOSES:  1.  Meniscus tear, degenerative joint disease, left knee. 2.  Lateral meniscus tear.  PROCEDURES PERFORMED:    1.  Left knee arthroscopy. 2.  Partial medial meniscectomy. 3.  Chondroplasty of medial femoral condyle and the tibia, patella, patellofemoral joint. 4.  Lateral meniscectomy.  ANESTHESIA:  General.  ASSISTANT: Lacie Draft, PA, who was used for the case to hold the leg and position the leg due to the patient's elevated BMI of 48.  INDICATIONS:  This is a 50 year old female with locking, popping, giving way, swelling of the knee.  Despite rest, activity modification, corticosteroid injections, MRI indicated degenerative change in the medial compartment and posterior root tear of  the medial meniscus.  She was indicated for knee arthroscopy, partial meniscectomy, and debridement.  Risks and benefits discussed including bleeding, infection, damage to neurovascular structures, no change in symptoms, worsening symptoms, DVT, PE,  anesthetic complications, etc.  TECHNIQUE:  With the patient in supine position, after induction of adequate general anesthesia and 3 g of Kefzol, left lower extremity was prepped and draped in the usual sterile fashion.  A lateral post was well padded.  After prepping and draping in  the usual sterile fashion, timeout was performed identifying the left leg.  Next, a lateral parapatellar portal was fashioned with a #11 blade.  Ingress cannula atraumatically placed.  Irrigant was utilized to insufflate the joint.  Under direct visualization, a medial parapatellar portal was fashioned with a #11 blade after localization with an  18-gauge needle sparing the medial meniscus.  Noted was grade III change in the tibial plateau and the femoral condyle.  There was a  posterior horn root tear of the medial meniscus.  This was probed and displaced into the joint.  I introduced an upbiting basket and resected approximately one third of the posterior third of the meniscus to a stable base.  The remnant was stable to  probe palpation.  I performed light chondroplasty of the femoral condyle and the tibial plateau.  Attention was next turned towards the patellofemoral joint.  Camera was redirected in the subacromial space.  There were some grade III changes of the patella in the sulcus.  A light chondroplasty was performed of both.  There was normal patellofemoral tracking.  The gutters were unremarkable.  Examination of the  lateral compartment revealed some mild fraying of the lateral meniscus.  Minimal grade III changes.  Introduced a shaver and shaved the lateral meniscus to a stable base.  The remnant was stable to probe palpation.  No grade IV changes.  The ACL was  unremarkable.  I revisited all compartments.  No further pathology amenable to arthroscopic intervention.  I, therefore, removed all instrumentation.  Portals were closed with 4-0 nylon simple sutures.  0.25% Marcaine with epinephrine was infiltrated in the joint.   Wound was dressed sterilely, awoken without difficulty, and transported to the recovery room in satisfactory condition.  The patient tolerated the procedure well.  There were no complications.  Assistant was [employer], [place], again was utilized due to the patient's elevated BMI and need for placement of the leg in figure-of-four position.   ROH D: 04/23/2021 1:34:12 pm T: 04/23/2021 10:38:00 pm  JOB: [id number]

## 2021-04-23 NOTE — Interval H&P Note (Signed)
History and Physical Interval Note:  04/23/2021 12:32 PM  Stephanie Mcconnell  has presented today for surgery, with the diagnosis of Left knee medial meniscal tear, degenerative joint disease.  The various methods of treatment have been discussed with the patient and family. After consideration of risks, benefits and other options for treatment, the patient has consented to  Procedure(s): KNEE ARTHROSCOPY WITH PARTIAL MEDIAL MENISECTOMY AND DEBRIDEMENT (Left) as a surgical intervention.  The patient's history has been reviewed, patient examined, no change in status, stable for surgery.  I have reviewed the patient's chart and labs.  Questions were answered to the patient's satisfaction.     Johnn Hai

## 2021-04-23 NOTE — Transfer of Care (Signed)
Immediate Anesthesia Transfer of Care Note  Patient: Stephanie Mcconnell  Procedure(s) Performed: KNEE ARTHROSCOPY WITH PARTIAL MEDIAL MENISECTOMY AND DEBRIDEMENT (Left: Knee)  Patient Location: PACU  Anesthesia Type:General  Level of Consciousness: awake, alert  and patient cooperative  Airway & Oxygen Therapy: Patient Spontanous Breathing and Patient connected to face mask oxygen  Post-op Assessment: Report given to RN and Post -op Vital signs reviewed and stable  Post vital signs: Reviewed and stable  Last Vitals:  Vitals Value Taken Time  BP 143/97 04/23/21 1339  Temp    Pulse 89 04/23/21 1341  Resp 14 04/23/21 1341  SpO2 100 % 04/23/21 1341  Vitals shown include unvalidated device data.  Last Pain:  Vitals:   04/23/21 1043  TempSrc:   PainSc: 0-No pain         Complications: No notable events documented.

## 2021-04-23 NOTE — Anesthesia Postprocedure Evaluation (Signed)
Anesthesia Post Note  Patient: Stephanie Mcconnell  Procedure(s) Performed: KNEE ARTHROSCOPY WITH PARTIAL MEDIAL MENISECTOMY AND DEBRIDEMENT (Left: Knee)     Patient location during evaluation: PACU Anesthesia Type: General Level of consciousness: awake and alert Pain management: pain level controlled Vital Signs Assessment: post-procedure vital signs reviewed and stable Respiratory status: spontaneous breathing, nonlabored ventilation, respiratory function stable and patient connected to nasal cannula oxygen Cardiovascular status: blood pressure returned to baseline and stable Postop Assessment: no apparent nausea or vomiting Anesthetic complications: no   No notable events documented.  Last Vitals:  Vitals:   04/23/21 1340 04/23/21 1400  BP: (!) 143/97 (!) 148/89  Pulse: 91 79  Resp: 15 15  Temp: (!) 36.3 C   SpO2: 100% 100%    Last Pain:  Vitals:   04/23/21 1400  TempSrc:   PainSc: 0-No pain                 Brailyn Killion

## 2021-04-23 NOTE — Anesthesia Preprocedure Evaluation (Signed)
Anesthesia Evaluation  Patient identified by MRN, date of birth, ID band Patient awake    Reviewed: Allergy & Precautions, NPO status , Patient's Chart, lab work & pertinent test results  Airway Mallampati: II  TM Distance: >3 FB Neck ROM: Full    Dental no notable dental hx. (+) Teeth Intact, Dental Advisory Given   Pulmonary neg pulmonary ROS,    Pulmonary exam normal breath sounds clear to auscultation       Cardiovascular Normal cardiovascular exam Rhythm:Regular Rate:Normal     Neuro/Psych negative neurological ROS  negative psych ROS   GI/Hepatic negative GI ROS, Neg liver ROS,   Endo/Other  Morbid obesity  Renal/GU negative Renal ROS     Musculoskeletal  (+) Arthritis ,   Abdominal (+) + obese,   Peds  Hematology  (+) Blood dyscrasia, anemia ,   Anesthesia Other Findings   Reproductive/Obstetrics                             Anesthesia Physical  Anesthesia Plan  ASA: 3  Anesthesia Plan: General   Post-op Pain Management:    Induction: Intravenous  PONV Risk Score and Plan: 4 or greater and Midazolam, Treatment may vary due to age or medical condition and Ondansetron  Airway Management Planned: LMA  Additional Equipment: None  Intra-op Plan:   Post-operative Plan:   Informed Consent: I have reviewed the patients History and Physical, chart, labs and discussed the procedure including the risks, benefits and alternatives for the proposed anesthesia with the patient or authorized representative who has indicated his/her understanding and acceptance.     Dental advisory given  Plan Discussed with: CRNA and Anesthesiologist  Anesthesia Plan Comments: (LMA GA)        Anesthesia Quick Evaluation

## 2021-04-23 NOTE — Discharge Instructions (Signed)
ARTHROSCOPIC KNEE SURGERY HOME CARE INSTRUCTIONS   PAIN You will be expected to have a moderate amount of pain in the affected knee for approximately two weeks.  However, the first two to four days will be the most severe in terms of the pain you will experience.  Prescriptions have been provided for you to take as needed for the pain.  The pain can be markedly reduced by using the ice/compressive bandage given.  Exchange the ice packs whenever they thaw.  During the night, keep the bandage on because it will still provide some compression for the swelling.  Also, keep the leg elevated on pillows above your heart, and this will help alleviate the pain and swelling.  MEDICATION Prescriptions have been provided to take as needed for pain. To prevent blood clots, take Aspirin 325mg daily with a meal if not on a blood thinner and if no history of stomach ulcers.  ACTIVITY It is preferred that you stay on bedrest for approximately 24 hours.  However, you may go to the bathroom with help.  After this, you can start to be up and about progressively more.  Remember that the swelling may still increase after three to four days if you are up and doing too much.  You may put as much weight on the affected leg as pain will allow.  Use your crutches for comfort and safety.  However, as soon as you are able, you may discard the crutches and go without them.   DRESSING Keep the current dressing as dry as possible.  Two days after your surgery, you may remove the ice/compressive wrap, and surgical dressing.  You may now take a shower, but do not scrub the sounds directly with soap.  Let water rinse over these and gently wipe with your hand.  Reapply band-aids over the puncture wounds and more gauze if needed.  A slight amount of thin drainage can be normal at this time, and do not let it frighten you.  Reapply the ice/compressive wrap.  You may now repeat this every day each time you shower.  SYMPTOMS TO REPORT TO  YOUR DOCTOR  -Extreme pain.  -Extreme swelling.  -Temperature above 101 degrees that does not come down with acetaminophen     (Tylenol).  -Any changes in the feeling, color or movement of your toes.  -Extreme redness, heat, swelling or drainage at your incision  EXERCISE It is preferred that you begin to exercise on the day of your surgery.  Straight leg raises and short arc quads should be begun the afternoon or evening of surgery and continued until you come back for your follow-up appointment.   Attached is an instruction sheet on how to perform these two simple exercises.  Do these at least three times per day if not more.  You may bend your knee as much as is comfortable.  The puncture wounds may occasionally be slightly uncomfortable with bending of the knee.  Do not let this frighten you.  It is important to keep your knee motion, but do not overdo it.  If you have significant pain, simply do not bend the knee as far.   You will be given more exercises to perform at your first return visit.    RETURN APPOINTMENT Please make an appointment to be seen by your doctor in 10-14 days from your surgery.  Patient Signature:  ________________________________________________________  Nurse's Signature:  ________________________________________________________ 

## 2021-04-23 NOTE — Interval H&P Note (Signed)
History and Physical Interval Note:  04/23/2021 12:32 PM  Stephanie Mcconnell  has presented today for surgery, with the diagnosis of Left knee medial meniscal tear, degenerative joint disease.  The various methods of treatment have been discussed with the patient and family. After consideration of risks, benefits and other options for treatment, the patient has consented to  Procedure(s): KNEE ARTHROSCOPY WITH PARTIAL MEDIAL MENISECTOMY AND DEBRIDEMENT (Left) as a surgical intervention.  The patient's history has been reviewed, patient examined, no change in status, stable for surgery.  I have reviewed the patient's chart and labs.  Questions were answered to the patient's satisfaction.     Stephanie Mcconnell

## 2021-04-26 ENCOUNTER — Encounter (HOSPITAL_COMMUNITY): Payer: Self-pay | Admitting: Specialist

## 2021-04-28 ENCOUNTER — Encounter: Payer: Self-pay | Admitting: Obstetrics & Gynecology

## 2021-04-28 ENCOUNTER — Ambulatory Visit (INDEPENDENT_AMBULATORY_CARE_PROVIDER_SITE_OTHER): Payer: No Typology Code available for payment source | Admitting: Obstetrics & Gynecology

## 2021-04-28 ENCOUNTER — Other Ambulatory Visit (HOSPITAL_BASED_OUTPATIENT_CLINIC_OR_DEPARTMENT_OTHER): Payer: Self-pay

## 2021-04-28 ENCOUNTER — Other Ambulatory Visit: Payer: Self-pay

## 2021-04-28 ENCOUNTER — Other Ambulatory Visit
Admission: RE | Admit: 2021-04-28 | Discharge: 2021-04-28 | Disposition: A | Discharge status: Home / Self Care | Payer: No Typology Code available for payment source | Source: Ambulatory Visit | Attending: Obstetrics & Gynecology | Admitting: Obstetrics & Gynecology

## 2021-04-28 ENCOUNTER — Ambulatory Visit: Payer: No Typology Code available for payment source

## 2021-04-28 VITALS — Wt 281.4 lb

## 2021-04-28 DIAGNOSIS — Z23 Encounter for immunization: Secondary | ICD-10-CM | POA: Diagnosis not present

## 2021-04-28 DIAGNOSIS — N760 Acute vaginitis: Secondary | ICD-10-CM

## 2021-04-28 DIAGNOSIS — N898 Other specified noninflammatory disorders of vagina: Secondary | ICD-10-CM

## 2021-04-28 DIAGNOSIS — B9689 Other specified bacterial agents as the cause of diseases classified elsewhere: Secondary | ICD-10-CM | POA: Diagnosis not present

## 2021-04-28 DIAGNOSIS — N939 Abnormal uterine and vaginal bleeding, unspecified: Secondary | ICD-10-CM | POA: Diagnosis not present

## 2021-04-28 MED ORDER — METRONIDAZOLE 500 MG PO TABS
500.0000 mg | ORAL_TABLET | Freq: Two times a day (BID) | ORAL | 0 refills | Status: DC
  Filled 2021-04-28: qty 14, 7d supply, fill #0

## 2021-04-28 NOTE — Progress Notes (Signed)
History:  50 y.o.LMP here today for eval of vaginal odor post op. She is s/p hysteroscopy with endometrial ablation on 04/06/2021.  The odor just started and is new for her. She reports continued bleeding that is very light in nature. Pt reports that she is doing well. She is eating and passing stools without difficulty.   She denies pain. She has had knee surgery since this procedure.   The following portions of the patient's history were reviewed and updated as appropriate: allergies, current medications, past family history, past medical history, past social history, past surgical history and problem list.  Review of Systems:  Pertinent items are noted in HPI.    Objective:  Physical Exam Wt 281 lb 6.4 oz (127.6 kg)   LMP 04/15/2021   BMI 46.83 kg/m   CONSTITUTIONAL: Well-developed, well-nourished female in no acute distress.  HENT:  Normocephalic, atraumatic EYES: Conjunctivae and EOM are normal. No scleral icterus.  NECK: Normal range of motion SKIN: Skin is warm and dry. No rash noted. Not diaphoretic.No pallor. [place]: Alert and oriented to person, place, and time. Normal coordination.  Abd: Soft, nontender and nondistended; her port sites are healing well. .  Pelvic: no abnormal discharge noted. No CMT. No blood in vault.   Labs and Imaging Surg path 04/06/2021 FINAL MICROSCOPIC DIAGNOSIS:   A. ENDOMETRIUM, CURETTAGE:  - Benign proliferative endometrium  - Negative for hyperplasia or malignancy     Assessment & Plan:  Stephanie Mcconnell was seen today for post-op follow-up.  Diagnoses and all orders for this visit:  Vaginal odor -     Cervicovaginal ancillary only( [place]) -     metroNIDAZOLE (FLAGYL) 500 MG tablet; Take 1 tablet (500 mg total) by mouth 2 (two) times daily.  Abnormal uterine bleeding (AUB)  BV (bacterial vaginosis) -     metroNIDAZOLE (FLAGYL) 500 MG tablet; Take 1 tablet (500 mg total) by mouth 2 (two) times daily.   4 week post op check following  hysteroscopy with endometrial ablation on 04/06/2021  Doing well  Reviewed her surg path.   Reviewed post op instructions and activities  Gradual increase in activities  F/u in 1 year or sooner prn  All questions answered.   Stephanie Mcconnell L. Harraway-Smith, M.D., [name]

## 2021-04-28 NOTE — Progress Notes (Signed)
Patient has odor and discomfort.

## 2021-04-28 NOTE — Progress Notes (Signed)
   Covid-19 Vaccination Clinic  Name:  [name]    MRN: [id number] DOB: 06/03/1971  04/28/2021  [name] was observed post Covid-19 immunization for 15 minutes without incident. She was provided with Vaccine Information Sheet and instruction to access the V-Safe system.   [name] was instructed to call 911 with any severe reactions post vaccine: Difficulty breathing  Swelling of face and throat  A fast heartbeat  A bad rash all over body  Dizziness and weakness   Immunizations Administered     Name Date Dose VIS Date Route   PFIZER Comrnaty(Gray TOP) Covid-19 Vaccine 04/28/2021 11:37 AM 0.3 mL 09/10/2020 Intramuscular   Manufacturer: [place]   Lot: [id number]   [place]: [phone]

## 2021-04-30 LAB — CERVICOVAGINAL ANCILLARY ONLY
Bacterial Vaginitis (gardnerella): NEGATIVE
Candida Glabrata: NEGATIVE
Candida Vaginitis: NEGATIVE
Comment: NEGATIVE
Comment: NEGATIVE
Comment: NEGATIVE

## 2021-05-03 ENCOUNTER — Encounter: Payer: No Typology Code available for payment source | Admitting: Obstetrics & Gynecology

## 2021-05-04 ENCOUNTER — Other Ambulatory Visit (HOSPITAL_BASED_OUTPATIENT_CLINIC_OR_DEPARTMENT_OTHER): Payer: Self-pay

## 2021-05-04 MED ORDER — COVID-19 MRNA VAC-TRIS(PFIZER) 30 MCG/0.3ML IM SUSP
INTRAMUSCULAR | 0 refills | Status: DC
  Filled 2021-05-04: qty 0.3, 1d supply, fill #0

## 2021-05-10 ENCOUNTER — Encounter: Payer: No Typology Code available for payment source | Admitting: Obstetrics & Gynecology

## 2021-06-03 ENCOUNTER — Other Ambulatory Visit (HOSPITAL_COMMUNITY): Payer: Self-pay

## 2021-06-03 MED ORDER — OXYCODONE-ACETAMINOPHEN 5-325 MG PO TABS
ORAL_TABLET | ORAL | 0 refills | Status: DC
  Filled 2021-06-03: qty 14, 7d supply, fill #0

## 2021-06-05 ENCOUNTER — Other Ambulatory Visit (HOSPITAL_COMMUNITY): Payer: Self-pay

## 2021-10-21 ENCOUNTER — Other Ambulatory Visit (HOSPITAL_COMMUNITY): Payer: Self-pay

## 2021-10-21 MED ORDER — OXYCODONE-ACETAMINOPHEN 5-325 MG PO TABS
ORAL_TABLET | ORAL | 0 refills | Status: DC
  Filled 2021-10-21: qty 30, 8d supply, fill #0

## 2021-12-24 NOTE — Progress Notes (Signed)
? ?New Patient Office Visit ? ?Subjective:  ?Patient ID: Stephanie Mcconnell, female    DOB: 1971/03/22  Age: 51 y.o. MRN: [id number] ? ?CC:  ?Chief Complaint  ?Patient presents with  ? Establish Care  ?  Np. Est care. Discuss weight management  ? ? ?HPI ?Stephanie Mcconnell presents for new patient visit to establish care.  Introduced to Designer, jewellery role and practice setting.  All questions answered.  Discussed provider/patient relationship and expectations. ? ?She has been losing and gaining weight for the past several years. She has been trying to lose weight on her own, including intermittent fasting, she has tried counting calories and went to a weight loss clinic. She has tried [place] in the past. She tries to exercise inside, as much as she can with her recent knee surgery. She is thinking about swimming at the [facility]. This is the heaviest weight she has been. She is interested in starting ozempic or [id number].  She denies shortness of breath, chest pain. ? ?She has a history of bilateral meniscus tears in her knee.  She had surgery on both knees to repair this, with the last one in 04/2021.  She has been afraid to exercise after her surgery, however knows that losing weight will help with the ongoing pain. ? ? ?Past Medical History:  ?Diagnosis Date  ? Acute meniscal tear of left knee   ? Anemia   ? secondary to uterine fibroids with AUB  ? Bulging disc   ? lower back.   ? History of 2019 novel coronavirus disease (COVID-19) 03/04/2021  ? per pt positive home covid test,  only sore throat that resolved  ? History of exposure to tuberculosis 2003  ? per pt treated but never dx'd  ? Menorrhagia   ? OA (osteoarthritis)   ? Seasonal allergic rhinitis   ? Uterine fibroid   ? Varicose vein of leg   ? ? ?Past Surgical History:  ?Procedure Laterality Date  ? COLONOSCOPY  01/06/2021  ? dr Henrene Pastor  ? ENDOSCOPIC VEIN LASER TREATMENT    ? [facility]  ? ESSURE TUBAL LIGATION    ? per pt in GYN office, approx. 2017  ?  HYSTEROSCOPY WITH NOVASURE N/A 04/06/2021  ? Procedure: HYSTEROSCOPY WITH NOVASURE;  Surgeon: Lavonia Drafts, MD;  Location: [facility];  Service: Gynecology;  Laterality: N/A;  ? KNEE ARTHROSCOPY WITH MEDIAL MENISECTOMY Right 02/10/2021  ? Procedure: RIGHT KNEE ARTHROSCOPY WITH PARTIAL MEDIAL MENISECTOMY AND DEBRIDEMENT;  Surgeon: Susa Day, MD;  Location: [facility];  Service: Orthopedics;  Laterality: Right;  60 MINS  ? KNEE ARTHROSCOPY WITH MEDIAL MENISECTOMY Left 04/23/2021  ? Procedure: KNEE ARTHROSCOPY WITH PARTIAL MEDIAL MENISECTOMY AND DEBRIDEMENT;  Surgeon: Susa Day, MD;  Location: [facility];  Service: Orthopedics;  Laterality: Left;  ? MASS EXCISION  03/12/2012  ? LIPOMA-  Procedure: EXCISION MASS;  Surgeon: Madilyn Hook, DO;  Location: [place];  Service: General;  Laterality: Right;   ? VENTRAL HERNIA REPAIR  03/12/2012  ? Procedure: LAPAROSCOPIC VENTRAL HERNIA;  Surgeon: Madilyn Hook, DO;  Location: [place];  Service: General;  Laterality: N/A;  ? ? ?Family History  ?Problem Relation Age of Onset  ? Healthy Mother   ? Healthy Father   ? Colon polyps Neg Hx   ? Colon cancer Neg Hx   ? Esophageal cancer Neg Hx   ? Stomach cancer Neg Hx   ? Rectal cancer Neg Hx   ? ? ?Social History  ? ?  Socioeconomic History  ? Marital status: Married  ?  Spouse name: Not on file  ? Number of children: 2  ? Years of education: Not on file  ? Highest education level: Not on file  ?Occupational History  ? Occupation: Chartered certified accountant  ?  Comment: Flex, weekends  ?Tobacco Use  ? Smoking status: Never  ? Smokeless tobacco: Never  ?Vaping Use  ? Vaping Use: Never used  ?Substance and Sexual Activity  ? Alcohol use: Yes  ?  Alcohol/week: 1.0 standard drink  ?  Types: 1 Glasses of wine per week  ?  Comment: rarely  ? Drug use: Never  ? Sexual activity: Yes  ?  Partners: Male  ?  Birth control/protection: Other-see comments  ?  Comment: ESSURE DEVICE  ?Other Topics Concern  ? Not on file  ?Social History Narrative   ? Not on file  ? ?Social Determinants of Health  ? ?Financial Resource Strain: Not on file  ?Food Insecurity: Not on file  ?Transportation Needs: Not on file  ?Physical Activity: Not on file  ?Stress: Not on file  ?Social Connections: Not on file  ?Intimate Partner Violence: Not on file  ? ? ?ROS ?Review of Systems  ?Constitutional:  Positive for fatigue.  ?HENT: Negative.    ?Eyes: Negative.   ?Respiratory: Negative.    ?Cardiovascular: Negative.   ?Gastrointestinal: Negative.   ?Genitourinary: Negative.   ?Musculoskeletal:  Positive for arthralgias (chronic knee pain).  ?Skin: Negative.   ?Neurological: Negative.   ?Psychiatric/Behavioral: Negative.    ? ?Objective:  ? ?Today's Vitals: BP 130/88 (BP Location: Right Arm, Patient Position: Sitting, Cuff Size: Large)   Pulse 70   Temp (!) 96.4 ?F (35.8 ?C) (Temporal)   Ht '5\' 5"'$  (1.651 m)   Wt 292 lb 6.4 oz (132.6 kg)   SpO2 98%   BMI 48.66 kg/m?  ? ?Physical Exam ?Vitals and nursing note reviewed.  ?Constitutional:   ?   General: She is not in acute distress. ?   Appearance: Normal appearance.  ?HENT:  ?   Head: Normocephalic.  ?Eyes:  ?   Conjunctiva/sclera: Conjunctivae normal.  ?Cardiovascular:  ?   Rate and Rhythm: Normal rate and regular rhythm.  ?   Pulses: Normal pulses.  ?   Heart sounds: Normal heart sounds.  ?Pulmonary:  ?   Effort: Pulmonary effort is normal.  ?   Breath sounds: Normal breath sounds.  ?Abdominal:  ?   Palpations: Abdomen is soft.  ?   Tenderness: There is no abdominal tenderness.  ?Musculoskeletal:  ?   Cervical back: Normal range of motion. No tenderness.  ?   Right lower leg: No edema.  ?   Left lower leg: No edema.  ?Lymphadenopathy:  ?   Cervical: No cervical adenopathy.  ?Skin: ?   General: Skin is warm.  ?Neurological:  ?   General: No focal deficit present.  ?   Mental Status: She is alert and oriented to person, place, and time.  ?Psychiatric:     ?   Mood and Affect: Mood normal.     ?   Behavior: Behavior normal.     ?    Thought Content: Thought content normal.     ?   Judgment: Judgment normal.  ? ? ?Assessment & Plan:  ? ?Problem List Items Addressed This Visit   ? ?  ? Other  ? Morbid obesity ([place])  ?  BMI 48.6.  She has tried multiple ways to lose  weight in the past including counting calories, intermittent fasting, going to a weight loss clinic, and Saxenda without results.  She walks at lunch on her breaks, however she is afraid to exercise intensively with her knee injury and pain.  Discussed diet, exercise that she can do including swimming which may be easier on her joints.  We will start Wegovy 0.25 mg weekly injection.  Discussed possible side effects.  Goal is to lose 1 to 2 pounds per week.  Follow-up in 6 to 8 weeks. ?  ?  ? Relevant Medications  ? Semaglutide-Weight Management (WEGOVY) 0.25 MG/0.5ML SOAJ  ? Chronic pain of both knees  ?  Chronic pain in both knees after having bilateral meniscus tears and surgery to repair this.  Encouraged weight loss as this can help with pain.  Continue over-the-counter Tylenol or ibuprofen as needed for pain. ?  ?  ? Prediabetes - Primary  ?  A1c today is 6.0%.  Discussed that this is prediabetes.  Discussed limiting carbs and added sugars, along with losing weight.  Starting [facility] once a week injection to help with weight loss as discussed above.  Last CMP, CBC reviewed from 04/2021.  Follow-up in 6 months. ?  ?  ? Relevant Orders  ? POCT HgB A1C (Completed)  ? ? ?Outpatient Encounter Medications as of 12/27/2021  ?Medication Sig  ? Semaglutide-Weight Management (WEGOVY) 0.25 MG/0.5ML SOAJ Inject 0.25 mg into the skin once a week.  ? aspirin 81 MG EC tablet Take 1 tablet (81 mg total) by mouth daily.  ? Azelastine HCl 137 MCG/SPRAY SOLN use 1 spray in each nostril twice a day (Patient taking differently: Place 1 spray into both nostrils daily as needed (allergies).)  ? fluticasone (FLONASE) 50 MCG/ACT nasal spray Use 1-2 sprays into both nostrils daily (Patient taking differently:  Place 2 sprays into both nostrils daily as needed for allergies.)  ? ibuprofen (ADVIL) 600 MG tablet Take 1 tablet (600 mg total) by mouth every 8 (eight) hours as needed for moderate pain or cramping.  ? levocetiri

## 2021-12-27 ENCOUNTER — Other Ambulatory Visit (HOSPITAL_BASED_OUTPATIENT_CLINIC_OR_DEPARTMENT_OTHER): Payer: Self-pay

## 2021-12-27 ENCOUNTER — Encounter: Payer: Self-pay | Admitting: Nurse Practitioner

## 2021-12-27 ENCOUNTER — Ambulatory Visit (INDEPENDENT_AMBULATORY_CARE_PROVIDER_SITE_OTHER): Payer: No Typology Code available for payment source | Admitting: Nurse Practitioner

## 2021-12-27 VITALS — BP 130/88 | HR 70 | Temp 96.4°F | Ht 65.0 in | Wt 292.4 lb

## 2021-12-27 DIAGNOSIS — M25561 Pain in right knee: Secondary | ICD-10-CM

## 2021-12-27 DIAGNOSIS — G8929 Other chronic pain: Secondary | ICD-10-CM

## 2021-12-27 DIAGNOSIS — R7303 Prediabetes: Secondary | ICD-10-CM | POA: Diagnosis not present

## 2021-12-27 DIAGNOSIS — M25562 Pain in left knee: Secondary | ICD-10-CM | POA: Diagnosis not present

## 2021-12-27 LAB — POCT GLYCOSYLATED HEMOGLOBIN (HGB A1C)
HbA1c POC (<> result, manual entry): 6 % (ref 4.0–5.6)
HbA1c, POC (controlled diabetic range): 6 % (ref 0.0–7.0)
HbA1c, POC (prediabetic range): 6 % (ref 5.7–6.4)
Hemoglobin A1C: 6 % — AB (ref 4.0–5.6)

## 2021-12-27 MED ORDER — WEGOVY 0.25 MG/0.5ML ~~LOC~~ SOAJ
0.2500 mg | SUBCUTANEOUS | 1 refills | Status: DC
  Filled 2021-12-27: qty 2, 28d supply, fill #0

## 2021-12-27 NOTE — Patient Instructions (Signed)
It was great to see you! ? ?Start Wegovy injection 0.'25mg'$  once a week. Continue dietary changes and exercising as able.  ? ?Let's follow-up in 6-8 weeks, sooner if you have concerns. ? ?If a referral was placed today, you will be contacted for an appointment. Please note that routine referrals can sometimes take up to 3-4 weeks to process. Please call our office if you haven't heard anything after this time frame. ? ?Take care, ? ?Vance Peper, NP ? ?

## 2021-12-27 NOTE — Assessment & Plan Note (Signed)
BMI 48.6.  She has tried multiple ways to lose weight in the past including counting calories, intermittent fasting, going to a weight loss clinic, and Saxenda without results.  She walks at lunch on her breaks, however she is afraid to exercise intensively with her knee injury and pain.  Discussed diet, exercise that she can do including swimming which may be easier on her joints.  We will start Wegovy 0.25 mg weekly injection.  Discussed possible side effects.  Goal is to lose 1 to 2 pounds per week.  Follow-up in 6 to 8 weeks. ?

## 2021-12-27 NOTE — Assessment & Plan Note (Signed)
Chronic pain in both knees after having bilateral meniscus tears and surgery to repair this.  Encouraged weight loss as this can help with pain.  Continue over-the-counter Tylenol or ibuprofen as needed for pain. ?

## 2021-12-27 NOTE — Assessment & Plan Note (Addendum)
A1c today is 6.0%.  Discussed that this is prediabetes.  Discussed limiting carbs and added sugars, along with losing weight.  Starting [facility] once a week injection to help with weight loss as discussed above.  Last CMP, CBC reviewed from 04/2021.  Follow-up in 6 months. ?

## 2021-12-28 ENCOUNTER — Other Ambulatory Visit (HOSPITAL_BASED_OUTPATIENT_CLINIC_OR_DEPARTMENT_OTHER): Payer: Self-pay

## 2021-12-29 ENCOUNTER — Other Ambulatory Visit (HOSPITAL_BASED_OUTPATIENT_CLINIC_OR_DEPARTMENT_OTHER): Payer: Self-pay

## 2021-12-30 ENCOUNTER — Other Ambulatory Visit (HOSPITAL_BASED_OUTPATIENT_CLINIC_OR_DEPARTMENT_OTHER): Payer: Self-pay

## 2022-01-03 ENCOUNTER — Telehealth: Payer: Self-pay

## 2022-01-03 NOTE — Telephone Encounter (Signed)
A user error has taken place: encounter opened in error, closed for administrative reasons.

## 2022-01-03 NOTE — Telephone Encounter (Signed)
PA approved for [facility] 0.'25mg'$ /0.66m. Patient notified via phone call. ? ?   ?

## 2022-01-14 ENCOUNTER — Other Ambulatory Visit (HOSPITAL_BASED_OUTPATIENT_CLINIC_OR_DEPARTMENT_OTHER): Payer: Self-pay

## 2022-01-20 ENCOUNTER — Encounter: Payer: Self-pay | Admitting: Nurse Practitioner

## 2022-01-21 ENCOUNTER — Other Ambulatory Visit (HOSPITAL_BASED_OUTPATIENT_CLINIC_OR_DEPARTMENT_OTHER): Payer: Self-pay

## 2022-01-21 MED ORDER — WEGOVY 0.5 MG/0.5ML ~~LOC~~ SOAJ
0.5000 mg | SUBCUTANEOUS | 0 refills | Status: DC
  Filled 2022-01-21: qty 2, 28d supply, fill #0

## 2022-01-24 ENCOUNTER — Other Ambulatory Visit (HOSPITAL_BASED_OUTPATIENT_CLINIC_OR_DEPARTMENT_OTHER): Payer: Self-pay

## 2022-02-06 NOTE — Progress Notes (Signed)
? ?Established Patient Office Visit ? ?Subjective   ?Patient ID: Stephanie Mcconnell, female    DOB: Jan 04, 1971  Age: 51 y.o. MRN: [id number] ? ?Chief Complaint  ?Patient presents with  ? Follow-up  ?  6 wk f/u  ? ? ?HPI ? ?Stephanie Mcconnell presents today to follow-up on weight management after starting [facility] 0.'25mg'$  injection weekly.  ? ?Overall she states that she is doing well on this medication.  She has lost 3 pounds since her last visit.  She is currently taking Wegovy 0.5 mg injection weekly.  She states that she does have a little bit of stomach cramping, however she started taking the medication at night which is helping some.  She also notes that she has a mild headache on the first day of increasing the dose of her medication.  She has been limiting the amount of food that she has been eating, watching portion sizes, and has also tried to increase her exercise.  She denies chest pain, shortness of breath. ? ? ? ?ROS ?See pertinent positives and negatives per HPI. ?  ?Objective:  ?  ? ?BP 130/88 (BP Location: Right Wrist, Cuff Size: Normal)   Pulse 95   Wt 289 lb 3.2 oz (131.2 kg)   SpO2 100%   BMI 48.13 kg/m?  ?Wt Readings from Last 3 Encounters:  ?02/07/22 289 lb 3.2 oz (131.2 kg)  ?12/27/21 292 lb 6.4 oz (132.6 kg)  ?04/28/21 281 lb 6.4 oz (127.6 kg)  ? ?Physical Exam ?Vitals and nursing note reviewed.  ?Constitutional:   ?   General: She is not in acute distress. ?   Appearance: Normal appearance.  ?HENT:  ?   Head: Normocephalic.  ?Eyes:  ?   Conjunctiva/sclera: Conjunctivae normal.  ?Cardiovascular:  ?   Rate and Rhythm: Normal rate and regular rhythm.  ?   Pulses: Normal pulses.  ?   Heart sounds: Normal heart sounds.  ?Pulmonary:  ?   Effort: Pulmonary effort is normal.  ?   Breath sounds: Normal breath sounds.  ?Musculoskeletal:  ?   Cervical back: Normal range of motion.  ?Skin: ?   General: Skin is warm.  ?Neurological:  ?   General: No focal deficit present.  ?   Mental Status: She is alert and oriented  to person, place, and time.  ?Psychiatric:     ?   Mood and Affect: Mood normal.     ?   Behavior: Behavior normal.     ?   Thought Content: Thought content normal.     ?   Judgment: Judgment normal.  ? ?  ?Assessment & Plan:  ? ?Problem List Items Addressed This Visit   ? ?  ? Other  ? Morbid obesity ([place])  ?  She has lost 3 pounds since her last visit.  Congratulated her on this.  She is tolerating the Wegovy injections for the most part, with a little bit of a headache when first increasing the dose and a little bit of stomach cramping.  She would like to continue the medication.  We will increase her to [facility] 1 mg weekly for 4 weeks, then increase to 1.7 mg weekly.  Continue monitoring her diet, limiting portions, and continuing exercise.  Follow-up in 2 months or sooner with concerns. ? ?  ?  ? Relevant Medications  ? Semaglutide-Weight Management (WEGOVY) 1 MG/0.5ML SOAJ  ? Semaglutide-Weight Management (WEGOVY) 1.7 MG/0.75ML SOAJ (Start on 03/14/2022)  ? ? ?Return in about 2 months (around  04/09/2022) for CPE.  ? ? ?Charyl Dancer, NP ? ?

## 2022-02-07 ENCOUNTER — Ambulatory Visit (INDEPENDENT_AMBULATORY_CARE_PROVIDER_SITE_OTHER): Payer: No Typology Code available for payment source | Admitting: Nurse Practitioner

## 2022-02-07 ENCOUNTER — Other Ambulatory Visit (HOSPITAL_BASED_OUTPATIENT_CLINIC_OR_DEPARTMENT_OTHER): Payer: Self-pay

## 2022-02-07 ENCOUNTER — Encounter: Payer: Self-pay | Admitting: Nurse Practitioner

## 2022-02-07 MED ORDER — WEGOVY 1 MG/0.5ML ~~LOC~~ SOAJ
1.0000 mg | SUBCUTANEOUS | 0 refills | Status: DC
  Filled 2022-02-07: qty 2, 28d supply, fill #0

## 2022-02-07 MED ORDER — WEGOVY 1.7 MG/0.75ML ~~LOC~~ SOAJ
1.7000 mg | SUBCUTANEOUS | 0 refills | Status: DC
  Filled 2022-02-07: qty 3, fill #0
  Filled 2022-03-17 – 2022-03-22 (×2): qty 3, 28d supply, fill #0

## 2022-02-07 NOTE — Assessment & Plan Note (Signed)
She has lost 3 pounds since her last visit.  Congratulated her on this.  She is tolerating the Wegovy injections for the most part, with a little bit of a headache when first increasing the dose and a little bit of stomach cramping.  She would like to continue the medication.  We will increase her to [facility] 1 mg weekly for 4 weeks, then increase to 1.7 mg weekly.  Continue monitoring her diet, limiting portions, and continuing exercise.  Follow-up in 2 months or sooner with concerns. ?

## 2022-02-07 NOTE — Patient Instructions (Signed)
It was great to see you! ? ?Continue Wegovy injection once a week. Increase to '1mg'$  weekly after your finish your 0.'5mg'$  injection. Then in 4 weeks increase again to 1.'7mg'$  weekly.  ? ?Let's follow-up in 2 months, sooner if you have concerns. ? ?If a referral was placed today, you will be contacted for an appointment. Please note that routine referrals can sometimes take up to 3-4 weeks to process. Please call our office if you haven't heard anything after this time frame. ? ?Take care, ? ?Vance Peper, NP ? ?

## 2022-02-11 ENCOUNTER — Other Ambulatory Visit (HOSPITAL_BASED_OUTPATIENT_CLINIC_OR_DEPARTMENT_OTHER): Payer: Self-pay

## 2022-02-23 ENCOUNTER — Encounter: Payer: Self-pay | Admitting: General Practice

## 2022-02-25 ENCOUNTER — Other Ambulatory Visit: Payer: Self-pay | Admitting: Surgery

## 2022-02-25 ENCOUNTER — Other Ambulatory Visit: Payer: Self-pay | Admitting: Nurse Practitioner

## 2022-02-25 ENCOUNTER — Other Ambulatory Visit: Payer: Self-pay | Admitting: Family Medicine

## 2022-02-25 DIAGNOSIS — Z1231 Encounter for screening mammogram for malignant neoplasm of breast: Secondary | ICD-10-CM

## 2022-03-17 ENCOUNTER — Ambulatory Visit (INDEPENDENT_AMBULATORY_CARE_PROVIDER_SITE_OTHER): Payer: No Typology Code available for payment source | Admitting: Podiatry

## 2022-03-17 ENCOUNTER — Ambulatory Visit (INDEPENDENT_AMBULATORY_CARE_PROVIDER_SITE_OTHER): Payer: No Typology Code available for payment source

## 2022-03-17 ENCOUNTER — Other Ambulatory Visit (HOSPITAL_BASED_OUTPATIENT_CLINIC_OR_DEPARTMENT_OTHER): Payer: Self-pay

## 2022-03-17 DIAGNOSIS — M722 Plantar fascial fibromatosis: Secondary | ICD-10-CM | POA: Diagnosis not present

## 2022-03-17 DIAGNOSIS — M7751 Other enthesopathy of right foot: Secondary | ICD-10-CM | POA: Diagnosis not present

## 2022-03-17 DIAGNOSIS — M7752 Other enthesopathy of left foot: Secondary | ICD-10-CM

## 2022-03-18 ENCOUNTER — Encounter: Payer: Self-pay | Admitting: Podiatry

## 2022-03-18 ENCOUNTER — Other Ambulatory Visit (HOSPITAL_BASED_OUTPATIENT_CLINIC_OR_DEPARTMENT_OTHER): Payer: Self-pay

## 2022-03-18 NOTE — Progress Notes (Signed)
Subjective:  Patient ID: Stephanie Mcconnell, female    DOB: 02-28-1971,  MRN: [id number]  Chief Complaint  Patient presents with   Plantar Fasciitis    Plantar Fasciitis ( bilateral)    51 y.o. female presents with the above complaint.  Patient presents with complaint of bilateral heel pain as well as bilateral ankle pain.  She states that it hurts with ambulation and has been hurting since quite some time.  She states it has progressive gotten worse.  She has seen me in the past for Planter fasciitis which was doing really good however over the last few weeks has progressive gotten worse.  She denies any other acute complaints she has not seen anyone as prior to seeing me.  She would like to discuss treatment options for this.   Review of Systems: Negative except as noted in the HPI. Denies N/V/F/Ch.  Past Medical History:  Diagnosis Date   Acute meniscal tear of left knee    Anemia    secondary to uterine fibroids with AUB   Bulging disc    lower back.    History of 2019 novel coronavirus disease (COVID-19) 03/04/2021   per pt positive home covid test,  only sore throat that resolved   History of exposure to tuberculosis 2003   per pt treated but never dx'd   Menorrhagia    OA (osteoarthritis)    Seasonal allergic rhinitis    Uterine fibroid    Varicose vein of leg     Current Outpatient Medications:    Azelastine HCl 137 MCG/SPRAY SOLN, use 1 spray in each nostril twice a day (Patient taking differently: Place 1 spray into both nostrils daily as needed (allergies).), Disp: 90 mL, Rfl: 1   fluticasone (FLONASE) 50 MCG/ACT nasal spray, Use 1-2 sprays into both nostrils daily (Patient taking differently: Place 2 sprays into both nostrils daily as needed for allergies.), Disp: 48 g, Rfl: 3   ibuprofen (ADVIL) 600 MG tablet, Take 1 tablet (600 mg total) by mouth every 8 (eight) hours as needed for moderate pain or cramping., Disp: 30 tablet, Rfl: 0   levocetirizine (XYZAL ALLERGY 24HR)  5 MG tablet, Take 1 tablet (5 mg total) by mouth daily., Disp: 90 tablet, Rfl: 3   Multiple Vitamin (MULTIVITAMIN) capsule, Take 1 capsule by mouth 3 (three) times a week., Disp: , Rfl:    oxyCODONE-acetaminophen (PERCOCET) 5-325 MG tablet, Take 1 tablet by mouth every 6 hours as needed., Disp: 30 tablet, Rfl: 0   Semaglutide-Weight Management (WEGOVY) 1 MG/0.5ML SOAJ, Inject 1 mg into the skin once a week., Disp: 2 mL, Rfl: 0   Semaglutide-Weight Management (WEGOVY) 1.7 MG/0.75ML SOAJ, Inject 1.7 mg into the skin once a week., Disp: 3 mL, Rfl: 0   aspirin 81 MG EC tablet, Take 1 tablet (81 mg total) by mouth daily., Disp: 14 tablet, Rfl: 1  Social History   Tobacco Use  Smoking Status Never  Smokeless Tobacco Never    No Known Allergies Objective:  There were no vitals filed for this visit. There is no height or weight on file to calculate BMI. Constitutional Well developed. Well nourished.  Vascular Dorsalis pedis pulses palpable bilaterally. Posterior tibial pulses palpable bilaterally. Capillary refill normal to all digits.  No cyanosis or clubbing noted. Pedal hair growth normal.  Neurologic Normal speech. Oriented to person, place, and time. Epicritic sensation to light touch grossly present bilaterally.  Dermatologic Nails well groomed and normal in appearance. No open wounds. No skin  lesions.  Orthopedic: Normal joint ROM without pain or crepitus bilaterally. No visible deformities. Tender to palpation at the calcaneal tuber bilaterally. No pain with calcaneal squeeze bilaterally. Ankle ROM diminished range of motion bilaterally. Silfverskiold Test: positive bilaterally.   Radiographs: Taken and reviewed. No acute fractures or dislocations. No evidence of stress fracture.  Plantar heel spur present. Posterior heel spur absent.  Pes planovalgus foot structure noted.  Ankle arthritis noted moderate.  Assessment:   1. Plantar fasciitis of right foot   2. Plantar  fasciitis of left foot   3. Capsulitis of right ankle   4. Capsulitis of left ankle    Plan:  Patient was evaluated and treated and all questions answered.  Bilateral ankle capsulitis -All questions and concerns were discussed with the patient in extensive detail.  Given the amount of pain that she is experienced she will benefit from injection to bilateral ankle joint.  Patient agrees with plan like to proceed with injection -A steroid injection was performed at bilateral ankle using 1% plain Lidocaine and 10 mg of Kenalog. This was well tolerated.   Plantar Fasciitis, bilaterally - XR reviewed as above.  - Educated on icing and stretching. Instructions given.  - Injection delivered to the plantar fascia as below. - DME: Plantar fascial brace dispensed to support the medial longitudinal arch of the foot and offload pressure from the heel and prevent arch collapse during weightbearing - Pharmacologic management: None  Procedure: Injection Tendon/Ligament Location: Bilateral plantar fascia at the glabrous junction; medial approach. Skin Prep: alcohol Injectate: 0.5 cc 0.5% marcaine plain, 0.5 cc of 1% Lidocaine, 0.5 cc kenalog 10. Disposition: Patient tolerated procedure well. Injection site dressed with a band-aid.  No follow-ups on file.

## 2022-03-22 ENCOUNTER — Other Ambulatory Visit (HOSPITAL_BASED_OUTPATIENT_CLINIC_OR_DEPARTMENT_OTHER): Payer: Self-pay

## 2022-03-22 ENCOUNTER — Other Ambulatory Visit (HOSPITAL_COMMUNITY): Payer: Self-pay

## 2022-03-28 ENCOUNTER — Other Ambulatory Visit (HOSPITAL_BASED_OUTPATIENT_CLINIC_OR_DEPARTMENT_OTHER): Payer: Self-pay

## 2022-03-28 ENCOUNTER — Ambulatory Visit (INDEPENDENT_AMBULATORY_CARE_PROVIDER_SITE_OTHER): Payer: No Typology Code available for payment source | Admitting: Nurse Practitioner

## 2022-03-28 ENCOUNTER — Ambulatory Visit
Admission: RE | Admit: 2022-03-28 | Discharge: 2022-03-28 | Disposition: A | Discharge status: Home / Self Care | Payer: No Typology Code available for payment source | Source: Ambulatory Visit | Attending: Nurse Practitioner | Admitting: Nurse Practitioner

## 2022-03-28 ENCOUNTER — Encounter: Payer: Self-pay | Admitting: Nurse Practitioner

## 2022-03-28 VITALS — BP 129/83 | HR 67 | Temp 97.9°F | Ht 65.0 in | Wt 283.8 lb

## 2022-03-28 DIAGNOSIS — D649 Anemia, unspecified: Secondary | ICD-10-CM | POA: Diagnosis not present

## 2022-03-28 DIAGNOSIS — Z13220 Encounter for screening for lipoid disorders: Secondary | ICD-10-CM | POA: Diagnosis not present

## 2022-03-28 DIAGNOSIS — R7303 Prediabetes: Secondary | ICD-10-CM | POA: Diagnosis not present

## 2022-03-28 DIAGNOSIS — Z114 Encounter for screening for human immunodeficiency virus [HIV]: Secondary | ICD-10-CM

## 2022-03-28 DIAGNOSIS — Z0000 Encounter for general adult medical examination without abnormal findings: Secondary | ICD-10-CM

## 2022-03-28 DIAGNOSIS — Z23 Encounter for immunization: Secondary | ICD-10-CM

## 2022-03-28 DIAGNOSIS — Z1231 Encounter for screening mammogram for malignant neoplasm of breast: Secondary | ICD-10-CM

## 2022-03-28 DIAGNOSIS — Z1159 Encounter for screening for other viral diseases: Secondary | ICD-10-CM

## 2022-03-28 LAB — LIPID PANEL
Cholesterol: 183 mg/dL (ref 0–200)
HDL: 52.8 mg/dL (ref >39.00)
LDL Cholesterol: 117 mg/dL — ABNORMAL HIGH (ref 0–99)
NonHDL: 129.82
Total CHOL/HDL Ratio: 3
Triglycerides: 63 mg/dL (ref 0.0–149.0)
VLDL: 12.6 mg/dL (ref 0.0–40.0)

## 2022-03-28 LAB — COMPREHENSIVE METABOLIC PANEL
ALT: 18 U/L (ref 0–35)
AST: 22 U/L (ref 0–37)
Albumin: 4.2 g/dL (ref 3.5–5.2)
Alkaline Phosphatase: 48 U/L (ref 39–117)
BUN: 13 mg/dL (ref 6–23)
CO2: 29 mEq/L (ref 19–32)
Calcium: 9.4 mg/dL (ref 8.4–10.5)
Chloride: 103 mEq/L (ref 96–112)
Creatinine, Ser: 0.81 mg/dL (ref 0.40–1.20)
GFR: 84.71 mL/min (ref >60.00)
Glucose, Bld: 96 mg/dL (ref 70–99)
Potassium: 4 mEq/L (ref 3.5–5.1)
Sodium: 139 mEq/L (ref 135–145)
Total Bilirubin: 0.5 mg/dL (ref 0.2–1.2)
Total Protein: 7 g/dL (ref 6.0–8.3)

## 2022-03-28 LAB — CBC WITH DIFFERENTIAL/PLATELET
Basophils Absolute: 0 10*3/uL (ref 0.0–0.1)
Basophils Relative: 0.6 % (ref 0.0–3.0)
Eosinophils Absolute: 0.1 10*3/uL (ref 0.0–0.7)
Eosinophils Relative: 1.6 % (ref 0.0–5.0)
HCT: 41.6 % (ref 36.0–46.0)
Hemoglobin: 13.6 g/dL (ref 12.0–15.0)
Lymphocytes Relative: 36.6 % (ref 12.0–46.0)
Lymphs Abs: 2.3 10*3/uL (ref 0.7–4.0)
MCHC: 32.6 g/dL (ref 30.0–36.0)
MCV: 85.4 fl (ref 78.0–100.0)
Monocytes Absolute: 0.5 10*3/uL (ref 0.1–1.0)
Monocytes Relative: 8.1 % (ref 3.0–12.0)
Neutro Abs: 3.3 10*3/uL (ref 1.4–7.7)
Neutrophils Relative %: 53.1 % (ref 43.0–77.0)
Platelets: 243 10*3/uL (ref 150.0–400.0)
RBC: 4.88 Mil/uL (ref 3.87–5.11)
RDW: 15.9 % — ABNORMAL HIGH (ref 11.5–15.5)
WBC: 6.3 10*3/uL (ref 4.0–10.5)

## 2022-03-28 LAB — HEMOGLOBIN A1C: Hgb A1c MFr Bld: 5.9 % (ref 4.6–6.5)

## 2022-03-28 MED ORDER — WEGOVY 2.4 MG/0.75ML ~~LOC~~ SOAJ
2.4000 mg | SUBCUTANEOUS | 3 refills | Status: DC
  Filled 2022-03-28 – 2022-04-08 (×2): qty 3, 28d supply, fill #0
  Filled 2022-05-05: qty 3, 28d supply, fill #1
  Filled 2022-05-29: qty 3, 28d supply, fill #2
  Filled 2022-06-30: qty 3, 28d supply, fill #3

## 2022-03-28 MED ORDER — MONTELUKAST SODIUM 10 MG PO TABS
10.0000 mg | ORAL_TABLET | Freq: Every day | ORAL | 11 refills | Status: DC
  Filled 2022-03-28: qty 90, 90d supply, fill #0
  Filled 2022-08-23: qty 30, 30d supply, fill #0
  Filled 2022-09-21: qty 30, 30d supply, fill #1
  Filled 2022-10-16: qty 30, 30d supply, fill #2

## 2022-03-28 NOTE — Assessment & Plan Note (Signed)
Her last A1c was 6.0%.  We will check A1c today.

## 2022-03-30 LAB — HEPATITIS C ANTIBODY: Hepatitis C Ab: NONREACTIVE

## 2022-03-30 LAB — IRON,TIBC AND FERRITIN PANEL
%SAT: 25 % (calc) (ref 16–45)
Ferritin: 37 ng/mL (ref 16–232)
Iron: 91 ug/dL (ref 45–160)
TIBC: 369 mcg/dL (calc) (ref 250–450)

## 2022-03-30 LAB — HIV ANTIBODY (ROUTINE TESTING W REFLEX): HIV 1&2 Ab, 4th Generation: NONREACTIVE

## 2022-04-04 ENCOUNTER — Other Ambulatory Visit (HOSPITAL_BASED_OUTPATIENT_CLINIC_OR_DEPARTMENT_OTHER): Payer: Self-pay

## 2022-04-08 ENCOUNTER — Other Ambulatory Visit (HOSPITAL_BASED_OUTPATIENT_CLINIC_OR_DEPARTMENT_OTHER): Payer: Self-pay

## 2022-04-08 ENCOUNTER — Encounter (HOSPITAL_BASED_OUTPATIENT_CLINIC_OR_DEPARTMENT_OTHER): Payer: Self-pay

## 2022-04-11 ENCOUNTER — Ambulatory Visit: Payer: No Typology Code available for payment source | Admitting: Nurse Practitioner

## 2022-04-11 ENCOUNTER — Encounter: Payer: Self-pay | Admitting: Nurse Practitioner

## 2022-04-11 ENCOUNTER — Ambulatory Visit (INDEPENDENT_AMBULATORY_CARE_PROVIDER_SITE_OTHER): Payer: No Typology Code available for payment source | Admitting: Nurse Practitioner

## 2022-04-11 VITALS — BP 122/90 | HR 89 | Temp 97.7°F | Wt 283.4 lb

## 2022-04-11 DIAGNOSIS — G8929 Other chronic pain: Secondary | ICD-10-CM

## 2022-04-11 DIAGNOSIS — M25561 Pain in right knee: Secondary | ICD-10-CM | POA: Diagnosis not present

## 2022-04-11 DIAGNOSIS — M25562 Pain in left knee: Secondary | ICD-10-CM | POA: Diagnosis not present

## 2022-04-11 NOTE — Patient Instructions (Signed)
It was great to see you!  Keep up the great work!   Let's follow-up in 3 months, sooner if you have concerns.  If a referral was placed today, you will be contacted for an appointment. Please note that routine referrals can sometimes take up to 3-4 weeks to process. Please call our office if you haven't heard anything after this time frame.  Take care,  Lyzette Reinhardt, NP  

## 2022-04-11 NOTE — Progress Notes (Unsigned)
   Established Patient Office Visit  Subjective   Patient ID: Stephanie Mcconnell, female    DOB: 1970/10/13  Age: 51 y.o. MRN: [id number]  Chief Complaint  Patient presents with   Follow-up    2 mo f/u med review/refill    HPI  {History (Optional):23778}  ROS    Objective:     BP 122/90   Pulse 89   Temp 97.7 F (36.5 C) (Temporal)   Wt 283 lb 6.4 oz (128.5 kg)   SpO2 97%   BMI 47.16 kg/m  BP Readings from Last 3 Encounters:  04/11/22 122/90  03/28/22 129/83  02/07/22 130/88   Wt Readings from Last 3 Encounters:  04/11/22 283 lb 6.4 oz (128.5 kg)  03/28/22 283 lb 12.8 oz (128.7 kg)  02/07/22 289 lb 3.2 oz (131.2 kg)      Physical Exam   No results found for any visits on 04/11/22.  {Labs (Optional):23779}  The 10-year ASCVD risk score (Arnett DK, et al., 2019) is: 1.6%    Assessment & Plan:   Problem List Items Addressed This Visit   None   No follow-ups on file.    Charyl Dancer, NP

## 2022-04-12 NOTE — Assessment & Plan Note (Signed)
She is currently doing well on the [facility] injections.  She has not lost any weight since her last visit 2 weeks ago.  She has been trying to increase her exercise without overdoing it that she hurts her knees.  She will reach the maximum doses of Wegovy 2.5 mg injection in 2 weeks.  Follow-up in 3 months.

## 2022-04-12 NOTE — Assessment & Plan Note (Signed)
Chronic, stable.  She is currently following with orthopedics.  Handicap tag for DMV completed.

## 2022-04-14 NOTE — Progress Notes (Signed)
PA done through covermymeds, waiting for determination from insurance. Will f/u with patient once a decision has been made.   PA Key: Key: [id number]

## 2022-04-15 ENCOUNTER — Ambulatory Visit (INDEPENDENT_AMBULATORY_CARE_PROVIDER_SITE_OTHER): Payer: No Typology Code available for payment source | Admitting: Podiatry

## 2022-04-15 DIAGNOSIS — M7752 Other enthesopathy of left foot: Secondary | ICD-10-CM

## 2022-04-15 DIAGNOSIS — M722 Plantar fascial fibromatosis: Secondary | ICD-10-CM | POA: Diagnosis not present

## 2022-04-15 DIAGNOSIS — M7751 Other enthesopathy of right foot: Secondary | ICD-10-CM

## 2022-04-19 NOTE — Progress Notes (Signed)
PA approved for [facility] 0.'25MG'$ /0.5ML via covermymeds.

## 2022-04-22 NOTE — Progress Notes (Signed)
Subjective:  Patient ID: Stephanie Mcconnell, female    DOB: Feb 29, 1971,  MRN: [id number]  Chief Complaint  Patient presents with   Plantar Fasciitis    51 y.o. female presents with the above complaint.  Patient presents for follow-up of bilateral plantar fasciitis.  She states she is doing a lot better the injection helped considerably.  She would like to know the next treatment plan.   Review of Systems: Negative except as noted in the HPI. Denies N/V/F/Ch.  Past Medical History:  Diagnosis Date   Acute meniscal tear of left knee    Anemia    secondary to uterine fibroids with AUB   Bulging disc    lower back.    History of 2019 novel coronavirus disease (COVID-19) 03/04/2021   per pt positive home covid test,  only sore throat that resolved   History of exposure to tuberculosis 2003   per pt treated but never dx'd   Menorrhagia    OA (osteoarthritis)    Seasonal allergic rhinitis    Uterine fibroid    Varicose vein of leg     Current Outpatient Medications:    Azelastine HCl 137 MCG/SPRAY SOLN, use 1 spray in each nostril twice a day (Patient taking differently: Place 1 spray into both nostrils daily as needed (allergies).), Disp: 90 mL, Rfl: 1   fluticasone (FLONASE) 50 MCG/ACT nasal spray, Use 1-2 sprays into both nostrils daily (Patient taking differently: Place 2 sprays into both nostrils daily as needed for allergies.), Disp: 48 g, Rfl: 3   ibuprofen (ADVIL) 600 MG tablet, Take 1 tablet (600 mg total) by mouth every 8 (eight) hours as needed for moderate pain or cramping., Disp: 30 tablet, Rfl: 0   levocetirizine (XYZAL ALLERGY 24HR) 5 MG tablet, Take 1 tablet (5 mg total) by mouth daily., Disp: 90 tablet, Rfl: 3   montelukast (SINGULAIR) 10 MG tablet, Take 1 tablet (10 mg total) by mouth daily., Disp: 30 tablet, Rfl: 11   Multiple Vitamin (MULTIVITAMIN) capsule, Take 1 capsule by mouth 3 (three) times a week., Disp: , Rfl:    oxyCODONE-acetaminophen (PERCOCET) 5-325 MG  tablet, Take 1 tablet by mouth every 6 hours as needed., Disp: 30 tablet, Rfl: 0   Semaglutide-Weight Management (WEGOVY) 1.7 MG/0.75ML SOAJ, Inject 1.7 mg into the skin once a week., Disp: 3 mL, Rfl: 0   Semaglutide-Weight Management (WEGOVY) 2.4 MG/0.75ML SOAJ, Inject 2.4 mg into the skin once a week., Disp: 3 mL, Rfl: 3  Social History   Tobacco Use  Smoking Status Never  Smokeless Tobacco Never    No Known Allergies Objective:  There were no vitals filed for this visit. There is no height or weight on file to calculate BMI. Constitutional Well developed. Well nourished.  Vascular Dorsalis pedis pulses palpable bilaterally. Posterior tibial pulses palpable bilaterally. Capillary refill normal to all digits.  No cyanosis or clubbing noted. Pedal hair growth normal.  Neurologic Normal speech. Oriented to person, place, and time. Epicritic sensation to light touch grossly present bilaterally.  Dermatologic Nails well groomed and normal in appearance. No open wounds. No skin lesions.  Orthopedic: Normal joint ROM without pain or crepitus bilaterally. No visible deformities. Residual tender to palpation at the calcaneal tuber bilaterally. No pain with calcaneal squeeze bilaterally. Ankle ROM diminished range of motion bilaterally. Silfverskiold Test: positive bilaterally.   Radiographs: Taken and reviewed. No acute fractures or dislocations. No evidence of stress fracture.  Plantar heel spur present. Posterior heel spur absent.  Pes  planovalgus foot structure noted.  Ankle arthritis noted moderate.  Assessment:   No diagnosis found.  Plan:  Patient was evaluated and treated and all questions answered.  Bilateral ankle capsulitis -Clinically doing a lot better   Plantar Fasciitis, bilaterally - XR reviewed as above.  - Educated on icing and stretching. Instructions given.  -Second injection delivered to the plantar fascia as below. - DME: Plantar fascial brace  dispensed to support the medial longitudinal arch of the foot and offload pressure from the heel and prevent arch collapse during weightbearing - Pharmacologic management: None -If it continues to bother her asked her to come back and see me.  Hopefully the second injection will get rid of her Planter fasciitis pain.  If any foot and ankle issues on future she will come back and see me.    No follow-ups on file.

## 2022-05-05 ENCOUNTER — Other Ambulatory Visit (HOSPITAL_BASED_OUTPATIENT_CLINIC_OR_DEPARTMENT_OTHER): Payer: Self-pay

## 2022-05-30 ENCOUNTER — Other Ambulatory Visit (HOSPITAL_BASED_OUTPATIENT_CLINIC_OR_DEPARTMENT_OTHER): Payer: Self-pay

## 2022-06-03 ENCOUNTER — Other Ambulatory Visit (HOSPITAL_BASED_OUTPATIENT_CLINIC_OR_DEPARTMENT_OTHER): Payer: Self-pay

## 2022-06-03 ENCOUNTER — Encounter: Payer: Self-pay | Admitting: Nurse Practitioner

## 2022-06-03 ENCOUNTER — Telehealth: Payer: No Typology Code available for payment source | Admitting: Physician Assistant

## 2022-06-03 DIAGNOSIS — J069 Acute upper respiratory infection, unspecified: Secondary | ICD-10-CM

## 2022-06-03 MED ORDER — BENZONATATE 100 MG PO CAPS
100.0000 mg | ORAL_CAPSULE | Freq: Three times a day (TID) | ORAL | 0 refills | Status: DC | PRN
Start: 2022-06-03 — End: 2022-06-30
  Filled 2022-06-03: qty 30, 10d supply, fill #0

## 2022-06-03 MED ORDER — LIDOCAINE VISCOUS HCL 2 % MT SOLN
15.0000 mL | Freq: Four times a day (QID) | OROMUCOSAL | 0 refills | Status: DC | PRN
  Filled 2022-06-03: qty 100, 2d supply, fill #0

## 2022-06-03 NOTE — Progress Notes (Signed)
I have spent 5 minutes in review of e-visit questionnaire, review and updating patient chart, medical decision making and response to patient.   Miachel Nardelli Cody Fouad Taul, PA-C    

## 2022-06-03 NOTE — Progress Notes (Signed)

## 2022-06-09 ENCOUNTER — Other Ambulatory Visit (HOSPITAL_BASED_OUTPATIENT_CLINIC_OR_DEPARTMENT_OTHER): Payer: Self-pay

## 2022-06-30 ENCOUNTER — Other Ambulatory Visit (HOSPITAL_BASED_OUTPATIENT_CLINIC_OR_DEPARTMENT_OTHER): Payer: Self-pay

## 2022-06-30 ENCOUNTER — Ambulatory Visit (INDEPENDENT_AMBULATORY_CARE_PROVIDER_SITE_OTHER): Payer: No Typology Code available for payment source | Admitting: Nurse Practitioner

## 2022-06-30 ENCOUNTER — Encounter: Payer: Self-pay | Admitting: Nurse Practitioner

## 2022-06-30 ENCOUNTER — Telehealth: Payer: Self-pay | Admitting: Nurse Practitioner

## 2022-06-30 VITALS — BP 118/88 | HR 56 | Temp 95.4°F | Wt 279.0 lb

## 2022-06-30 DIAGNOSIS — Z23 Encounter for immunization: Secondary | ICD-10-CM | POA: Diagnosis not present

## 2022-06-30 DIAGNOSIS — R7303 Prediabetes: Secondary | ICD-10-CM | POA: Diagnosis not present

## 2022-06-30 MED ORDER — WEGOVY 2.4 MG/0.75ML ~~LOC~~ SOAJ
2.4000 mg | SUBCUTANEOUS | 6 refills | Status: DC
  Filled 2022-06-30: qty 3, 28d supply, fill #0
  Filled 2022-07-28: qty 3, 28d supply, fill #1
  Filled 2022-08-23: qty 3, 28d supply, fill #2
  Filled 2022-09-21: qty 3, 28d supply, fill #3
  Filled 2022-10-16: qty 3, 28d supply, fill #4

## 2022-06-30 NOTE — Assessment & Plan Note (Signed)
She has started a nutritional program that is focusing on prediabetes and nutrition.  Continue this along with Wegovy injections weekly.

## 2022-06-30 NOTE — Assessment & Plan Note (Signed)
BMI is 46.4 today.  She has lost 4 pounds since her last visit and 13 pounds total, congratulated her on this.  Continue Wegovy 2.4 mg injection weekly.  Continue nutritional appointments through work as we discussed at her visit.  Follow-up in 6 months

## 2022-06-30 NOTE — Progress Notes (Signed)
Established Patient Office Visit  Subjective   Patient ID: Stephanie Mcconnell, female    DOB: 1971-07-28  Age: 51 y.o. MRN: [id number]  Chief Complaint  Patient presents with   Follow-up    4 mo f/u weight management    HPI  Kemora A Azevedo is here to follow-up on prediabetes and weight management.  She has lost 4 pounds since her last visit, with a total of 13 pounds since starting Wegovy.  She is tolerating the medication well without any side effects.  She is slowly trying to increase her exercise and hopes that when she loses more weight she can exercise more.  She states that she started with a nutrition program through work and goes to this every Monday.  She denies chest pain, shortness of breath, urinary frequency.    ROS See pertinent positives and negatives per HPI.    Objective:     BP 118/88   Pulse (!) 56   Temp (!) 95.4 F (35.2 C) (Temporal)   Wt 279 lb (126.6 kg)   SpO2 98%   BMI 46.43 kg/m  BP Readings from Last 3 Encounters:  06/30/22 118/88  04/11/22 122/90  03/28/22 129/83   Wt Readings from Last 3 Encounters:  06/30/22 279 lb (126.6 kg)  04/11/22 283 lb 6.4 oz (128.5 kg)  03/28/22 283 lb 12.8 oz (128.7 kg)      Physical Exam Vitals and nursing note reviewed.  Constitutional:      General: She is not in acute distress.    Appearance: Normal appearance.  HENT:     Head: Normocephalic.  Eyes:     Conjunctiva/sclera: Conjunctivae normal.  Cardiovascular:     Rate and Rhythm: Normal rate and regular rhythm.     Pulses: Normal pulses.     Heart sounds: Normal heart sounds.  Pulmonary:     Effort: Pulmonary effort is normal.     Breath sounds: Normal breath sounds.  Musculoskeletal:     Cervical back: Normal range of motion.  Skin:    General: Skin is warm.  Neurological:     General: No focal deficit present.     Mental Status: She is alert and oriented to person, place, and time.  Psychiatric:        Mood and Affect: Mood normal.         Behavior: Behavior normal.        Thought Content: Thought content normal.        Judgment: Judgment normal.    The 10-year ASCVD risk score (Arnett DK, et al., 2019) is: 1.4%    Assessment & Plan:   Problem List Items Addressed This Visit       Other   Morbid obesity ([place])    BMI is 46.4 today.  She has lost 4 pounds since her last visit and 13 pounds total, congratulated her on this.  Continue Wegovy 2.4 mg injection weekly.  Continue nutritional appointments through work as we discussed at her visit.  Follow-up in 6 months      Relevant Medications   Semaglutide-Weight Management (WEGOVY) 2.4 MG/0.75ML SOAJ   Prediabetes - Primary    She has started a nutritional program that is focusing on prediabetes and nutrition.  Continue this along with Wegovy injections weekly.      Other Visit Diagnoses     Need for shingles vaccine       Second shingrix vaccine given today   Relevant Orders   Varicella-zoster vaccine  IM (Completed)       Return in about 6 months (around 12/29/2022) for weight management.    Charyl Dancer, NP

## 2022-06-30 NOTE — Patient Instructions (Signed)
It was great to see you!  Keep up the great work!   Let's follow-up in 6 months, sooner if you have concerns.  If a referral was placed today, you will be contacted for an appointment. Please note that routine referrals can sometimes take up to 3-4 weeks to process. Please call our office if you haven't heard anything after this time frame.  Take care,  Avigdor Dollar, NP  

## 2022-06-30 NOTE — Telephone Encounter (Signed)
Caller Name: Stephanie Mcconnell Call back phone #: [phone]  Reason for Call: Pt called in believing there was a mistake with her prescription. Her appointment was today (9/28) for [facility] and her pharmacy told her that this prescription was cancelled

## 2022-07-15 ENCOUNTER — Ambulatory Visit: Payer: No Typology Code available for payment source | Admitting: Nurse Practitioner

## 2022-07-29 ENCOUNTER — Other Ambulatory Visit (HOSPITAL_BASED_OUTPATIENT_CLINIC_OR_DEPARTMENT_OTHER): Payer: Self-pay

## 2022-08-23 ENCOUNTER — Other Ambulatory Visit (HOSPITAL_BASED_OUTPATIENT_CLINIC_OR_DEPARTMENT_OTHER): Payer: Self-pay

## 2022-08-23 ENCOUNTER — Other Ambulatory Visit: Payer: Self-pay | Admitting: Nurse Practitioner

## 2022-08-24 ENCOUNTER — Other Ambulatory Visit (HOSPITAL_BASED_OUTPATIENT_CLINIC_OR_DEPARTMENT_OTHER): Payer: Self-pay

## 2022-08-24 MED ORDER — FLUTICASONE PROPIONATE 50 MCG/ACT NA SUSP
1.0000 | Freq: Every day | NASAL | 3 refills | Status: DC
  Filled 2022-08-24: qty 48, 90d supply, fill #0

## 2022-08-24 MED ORDER — LEVOCETIRIZINE DIHYDROCHLORIDE 5 MG PO TABS
5.0000 mg | ORAL_TABLET | Freq: Every day | ORAL | 3 refills | Status: DC
  Filled 2022-08-24: qty 90, 90d supply, fill #0

## 2022-08-27 ENCOUNTER — Ambulatory Visit
Admission: EM | Admit: 2022-08-27 | Discharge: 2022-08-27 | Disposition: A | Discharge status: Home / Self Care | Payer: No Typology Code available for payment source | Attending: Urgent Care | Admitting: Urgent Care

## 2022-08-27 DIAGNOSIS — J309 Allergic rhinitis, unspecified: Secondary | ICD-10-CM

## 2022-08-27 DIAGNOSIS — J988 Other specified respiratory disorders: Secondary | ICD-10-CM

## 2022-08-27 DIAGNOSIS — B9789 Other viral agents as the cause of diseases classified elsewhere: Secondary | ICD-10-CM

## 2022-08-27 DIAGNOSIS — R7303 Prediabetes: Secondary | ICD-10-CM | POA: Diagnosis not present

## 2022-08-27 MED ORDER — PREDNISONE 20 MG PO TABS: ORAL_TABLET | ORAL | 0 refills | Status: DC

## 2022-08-27 MED ORDER — PROMETHAZINE-DM 6.25-15 MG/5ML PO SYRP: 2.5000 mL | ORAL_SOLUTION | Freq: Three times a day (TID) | ORAL | 0 refills | Status: DC | PRN

## 2022-08-27 MED ORDER — CETIRIZINE HCL 10 MG PO TABS: 10.0000 mg | ORAL_TABLET | Freq: Every day | ORAL | 0 refills | Status: DC

## 2022-08-27 NOTE — ED Triage Notes (Signed)
Pt c/o nasal congestion, sore throat, bilat earache x 3-4 days-states she feels she has sinus infection

## 2022-08-27 NOTE — ED Provider Notes (Signed)
[facility]  Note:  This document was prepared using Systems analyst and may include unintentional dictation errors.  MRN: [id number] DOB: [birth year]  Subjective:   Stephanie Mcconnell is a 51 y.o. female presenting for 4-day history of acute onset sinus congestion, throat pain, bilateral ear fullness and pain.  Patient has also been coughing.  Has a history of difficult to control allergic rhinitis.  Takes her allergy medications consistently.  No chest pain, shortness of breath or wheezing.  No history of asthma.  Patient is not a smoker.  Declines a COVID test.  No current facility-administered medications for this encounter.  Current Outpatient Medications:    Azelastine HCl 137 MCG/SPRAY SOLN, use 1 spray in each nostril twice a day (Patient taking differently: Place 1 spray into both nostrils daily as needed (allergies).), Disp: 90 mL, Rfl: 1   fluticasone (FLONASE) 50 MCG/ACT nasal spray, Place 1-2 sprays into both nostrils daily., Disp: 48 g, Rfl: 3   ibuprofen (ADVIL) 600 MG tablet, Take 1 tablet (600 mg total) by mouth every 8 (eight) hours as needed for moderate pain or cramping., Disp: 30 tablet, Rfl: 0   levocetirizine (XYZAL ALLERGY 24HR) 5 MG tablet, Take 1 tablet (5 mg total) by mouth daily., Disp: 90 tablet, Rfl: 3   lidocaine (XYLOCAINE) 2 % solution, Use as directed 15 mLs in the mouth or throat every 6 (six) hours as needed for mouth pain., Disp: 200 mL, Rfl: 0   montelukast (SINGULAIR) 10 MG tablet, Take 1 tablet (10 mg total) by mouth daily., Disp: 30 tablet, Rfl: 11   Multiple Vitamin (MULTIVITAMIN) capsule, Take 1 capsule by mouth 3 (three) times a week., Disp: , Rfl:    oxyCODONE-acetaminophen (PERCOCET) 5-325 MG tablet, Take 1 tablet by mouth every 6 hours as needed., Disp: 30 tablet, Rfl: 0   Semaglutide-Weight Management (WEGOVY) 2.4 MG/0.75ML SOAJ, Inject 2.4 mg into the skin once a week., Disp: 3 mL, Rfl: 6   No Known  Allergies  Past Medical History:  Diagnosis Date   Acute meniscal tear of left knee    Anemia    secondary to uterine fibroids with AUB   Bulging disc    lower back.    History of 2019 novel coronavirus disease (COVID-19) 03/04/2021   per pt positive home covid test,  only sore throat that resolved   History of exposure to tuberculosis 2003   per pt treated but never dx'd   Menorrhagia    OA (osteoarthritis)    Seasonal allergic rhinitis    Uterine fibroid    Varicose vein of leg      Past Surgical History:  Procedure Laterality Date   COLONOSCOPY  01/06/2021   dr Henrene Pastor   ENDOSCOPIC VEIN LASER TREATMENT     [facility]   ESSURE TUBAL LIGATION     per pt in GYN office, approx. 2017   HYSTEROSCOPY WITH NOVASURE N/A 04/06/2021   Procedure: HYSTEROSCOPY WITH NOVASURE;  Surgeon: Lavonia Drafts, MD;  Location: [facility];  Service: Gynecology;  Laterality: N/A;   KNEE ARTHROSCOPY WITH MEDIAL MENISECTOMY Right 02/10/2021   Procedure: RIGHT KNEE ARTHROSCOPY WITH PARTIAL MEDIAL MENISECTOMY AND DEBRIDEMENT;  Surgeon: Susa Day, MD;  Location: [facility];  Service: Orthopedics;  Laterality: Right;  60 MINS   KNEE ARTHROSCOPY WITH MEDIAL MENISECTOMY Left 04/23/2021   Procedure: KNEE ARTHROSCOPY WITH PARTIAL MEDIAL MENISECTOMY AND DEBRIDEMENT;  Surgeon: Susa Day, MD;  Location: [facility];  Service:  Orthopedics;  Laterality: Left;   MASS EXCISION  03/12/2012   LIPOMA-  Procedure: EXCISION MASS;  Surgeon: Madilyn Hook, DO;  Location: [facility];  Service: General;  Laterality: Right;    VENTRAL HERNIA REPAIR  03/12/2012   Procedure: LAPAROSCOPIC VENTRAL HERNIA;  Surgeon: Madilyn Hook, DO;  Location: [facility];  Service: General;  Laterality: N/A;    Family History  Problem Relation Age of Onset   Healthy Mother    Healthy Father    Colon polyps Neg Hx    Colon cancer Neg Hx    Esophageal cancer Neg Hx    Stomach cancer Neg Hx    Rectal cancer Neg Hx     Social  History   Tobacco Use   Smoking status: Never   Smokeless tobacco: Never  Vaping Use   Vaping Use: Never used  Substance Use Topics   Alcohol use: Yes    Alcohol/week: 1.0 standard drink of alcohol    Types: 1 Glasses of wine per week    Comment: rarely   Drug use: Never    ROS   Objective:   Vitals: BP 131/86 (BP Location: Right Arm)   Pulse 97   Temp 98.2 F (36.8 C) (Oral)   Resp 16   SpO2 97%   Physical Exam Constitutional:      General: She is not in acute distress.    Appearance: Normal appearance. She is well-developed and normal weight. She is not ill-appearing, toxic-appearing or diaphoretic.  HENT:     Head: Normocephalic and atraumatic.     Right Ear: Tympanic membrane, ear canal and external ear normal. No drainage or tenderness. No middle ear effusion. There is no impacted cerumen. Tympanic membrane is not erythematous or bulging.     Left Ear: Tympanic membrane, ear canal and external ear normal. No drainage or tenderness.  No middle ear effusion. There is no impacted cerumen. Tympanic membrane is not erythematous or bulging.     Nose: Congestion and rhinorrhea present.     Mouth/Throat:     Mouth: Mucous membranes are moist. No oral lesions.     Pharynx: No pharyngeal swelling, oropharyngeal exudate, posterior oropharyngeal erythema or uvula swelling.     Tonsils: No tonsillar exudate or tonsillar abscesses.  Eyes:     General: No scleral icterus.       Right eye: No discharge.        Left eye: No discharge.     Extraocular Movements: Extraocular movements intact.     Right eye: Normal extraocular motion.     Left eye: Normal extraocular motion.     Conjunctiva/sclera: Conjunctivae normal.  Cardiovascular:     Rate and Rhythm: Normal rate and regular rhythm.     Heart sounds: Normal heart sounds. No murmur heard.    No friction rub. No gallop.  Pulmonary:     Effort: Pulmonary effort is normal. No respiratory distress.     Breath sounds: No  stridor. No wheezing, rhonchi or rales.  Chest:     Chest wall: No tenderness.  Musculoskeletal:     Cervical back: Normal range of motion and neck supple.  Lymphadenopathy:     Cervical: No cervical adenopathy.  Skin:    General: Skin is warm and dry.  Neurological:     General: No focal deficit present.     Mental Status: She is alert and oriented to person, place, and time.  Psychiatric:        Mood and Affect:  Mood normal.        Behavior: Behavior normal.      Assessment and Plan :   PDMP not reviewed this encounter.  1. Viral respiratory illness   2. Allergic rhinitis, unspecified seasonality, unspecified trigger   3. Prediabetes     Will use an oral prednisone course given the severity of her symptoms, allergic rhinitis. Deferred imaging given clear cardiopulmonary exam, hemodynamically stable vital signs. Patient declined a COVID test. Counseled patient on potential for adverse effects with medications prescribed/recommended today, ER and return-to-clinic precautions discussed, patient verbalized understanding.    Jaynee Eagles, PA-C 08/27/22 1219

## 2022-08-29 ENCOUNTER — Other Ambulatory Visit (HOSPITAL_BASED_OUTPATIENT_CLINIC_OR_DEPARTMENT_OTHER): Payer: Self-pay

## 2022-09-21 ENCOUNTER — Encounter: Payer: Self-pay | Admitting: Obstetrics & Gynecology

## 2022-09-21 ENCOUNTER — Other Ambulatory Visit (HOSPITAL_BASED_OUTPATIENT_CLINIC_OR_DEPARTMENT_OTHER): Payer: Self-pay

## 2022-09-21 ENCOUNTER — Ambulatory Visit: Payer: No Typology Code available for payment source | Admitting: Obstetrics & Gynecology

## 2022-09-21 ENCOUNTER — Other Ambulatory Visit (HOSPITAL_COMMUNITY)
Admission: RE | Admit: 2022-09-21 | Discharge: 2022-09-21 | Disposition: A | Discharge status: Home / Self Care | Payer: No Typology Code available for payment source | Source: Ambulatory Visit | Attending: Obstetrics & Gynecology | Admitting: Obstetrics & Gynecology

## 2022-09-21 VITALS — BP 123/63 | HR 82 | Wt 274.0 lb

## 2022-09-21 DIAGNOSIS — R87610 Atypical squamous cells of undetermined significance on cytologic smear of cervix (ASC-US): Secondary | ICD-10-CM

## 2022-09-21 DIAGNOSIS — N813 Complete uterovaginal prolapse: Secondary | ICD-10-CM | POA: Diagnosis not present

## 2022-09-21 DIAGNOSIS — Z01419 Encounter for gynecological examination (general) (routine) without abnormal findings: Secondary | ICD-10-CM

## 2022-09-21 NOTE — Progress Notes (Signed)
Subjective:     Stephanie Mcconnell is a 51 y.o. female here for a routine exam.  Current complaints: Pt reports that she is worried about uterine prolapse. She did not recall prev diagnose of prolapse but, reports that at present she feels a bulge and has to push the tissue back in. She feels embarrassed and did not know if the tissue would come out and stay out.  She denies leakage of urine. She is s/p endometrial ablation. She reports occ spotting but, not further heavy AUB as prev.   Pt has recently lost ~25#'s due to being on Wegovvy.       Gynecologic History No LMP recorded. Patient has had an ablation. Contraception: none Last Pap: 12/09/2020. Results were: ASCUS neg hrHPV Last mammogram: 03/28/2022. Results were: normal  Obstetric History OB History  Gravida Para Term Preterm AB Living  '2 2 2     2  '$ SAB IAB Ectopic Multiple Live Births               # Outcome Date GA Lbr Len/2nd Weight Sex Delivery Anes PTL Lv  2 Term           1 Term            The following portions of the patient's history were reviewed and updated as appropriate: allergies, current medications, past family history, past medical history, past social history, past surgical history, and problem list.  Review of Systems Pertinent items are noted in HPI.    Objective:  BP 123/63   Pulse 82   Wt 274 lb (124.3 kg)   BMI 45.60 kg/m   General Appearance:    Alert, cooperative, no distress, appears stated age  Head:    Normocephalic, without obvious abnormality, atraumatic  Eyes:    conjunctiva/corneas clear, EOM's intact, both eyes  Ears:    Normal external ear canals, both ears  Nose:   Nares normal, septum midline, mucosa normal, no drainage    or sinus tenderness  Throat:   Lips, mucosa, and tongue normal; teeth and gums normal  Neck:   Supple, symmetrical, trachea midline, no adenopathy;    thyroid:  no enlargement/tenderness/nodules  Back:     Symmetric, no curvature, ROM normal, no CVA tenderness   Lungs:     respirations unlabored  Chest Wall:    No tenderness or deformity   Heart:    Regular rate and rhythm  Breast Exam:    No tenderness, masses, or nipple abnormality  Abdomen:     Soft, non-tender, bowel sounds active all four quadrants,    no masses, no organomegaly  Genitalia:    Normal female without lesion, discharge or tenderness   Complete uterine and bladder prolapse with valsalva. No leakage of urine even with valsalva.   Extremities:   Extremities normal, atraumatic, no cyanosis or edema  Pulses:   2+ and symmetric all extremities  Skin:   Skin color, texture, turgor normal, no rashes or lesions     Assessment:    Healthy female exam.  Complete procidentia with valsalva- d/w pt management options including surgery and pessary. Given verbal and written info on POP.     Plan:   Diagnoses and all orders for this visit:  Well female exam with routine gynecological exam  Complete uterine prolapse -     Ambulatory referral to Urogynecology  ASCUS of cervix with negative high risk HPV -     Cytology - PAP( [place])  F/u with Dr. Wannetta Sender.  F/u for annual in 1 year.  F/u sooner prn   Ryosuke Ericksen L. Harraway-Smith, M.D., [name]

## 2022-09-21 NOTE — Patient Instructions (Signed)
Pelvic Organ Prolapse Pelvic organ prolapse is a condition in women that involves the stretching, bulging, or dropping of pelvic organs into an abnormal position, past the opening of the vagina. It happens when the muscles and tissues that surround and support pelvic structures become weak or stretched. Pelvic organ prolapse can involve the: Vagina (vaginal prolapse). Uterus (uterine prolapse). Bladder (cystocele). Rectum (rectocele). Intestines (enterocele). When organs other than the vagina are involved, they often bulge into the vagina or protrude from the vagina, depending on how severe the prolapse is. What are the causes? This condition may be caused by: Pregnancy, labor, and childbirth. Past pelvic surgery. Lower levels of the hormone estrogen due to menopause. Consistently lifting more than 50 lb (23 kg). Obesity. Long-term difficulty passing stool (chronic constipation). Long-term, or chronic, cough. Fluid buildup in the abdomen due to certain conditions. What are the signs or symptoms? Symptoms of this condition include: Leaking a little urine (loss of bladder control) when you cough, sneeze, strain, and exercise (stress incontinence). This may be worse immediately after childbirth. It may gradually improve over time. Feeling pressure in your pelvis or vagina. This pressure may increase when you cough or when you are passing stool. A bulge that protrudes from the opening of your vagina. Difficulty passing urine or stool. Pain in your lower back. Pain or discomfort during sex, or decreased interest in sex. Repeated bladder infections (urinary tract infections). Difficulty inserting a tampon. In some people, this condition causes no symptoms. How is this diagnosed? This condition may be diagnosed based on a vaginal and rectal exam. During the exam, you may be asked to cough and strain while you are lying down, sitting, and standing up. Your health care provider will determine if  other tests are required, such as bladder function tests. How is this treated? Treatment for this condition may depend on your symptoms. Treatment may include: Lifestyle changes, such as drinking plenty of fluids and eating foods that are high in fiber. Emptying your bladder at scheduled times (bladder training therapy). This can help reduce or avoid urinary incontinence. Estrogen. This may help mild prolapse by increasing the strength and tone of pelvic floor muscles. Kegel exercises. These may help mild cases of prolapse by strengthening and tightening the muscles of the pelvic floor. A soft, flexible device that helps support the vaginal walls and keep pelvic organs in place (pessary). This is inserted into your vagina by your health care provider. Surgery. This is often the only form of treatment for severe prolapse. Follow these instructions at home: Eating and drinking Avoid drinking beverages that contain caffeine or alcohol. Increase your intake of high-fiber foods to decrease constipation and straining during bowel movements. Activity Lose weight if recommended by your health care provider. Avoid heavy lifting and straining with exercise and work. Do not hold your breath when you perform mild to moderate lifting and exercise activities. Limit your activities as directed by your health care provider. Do Kegel exercises as directed by your health care provider. To do this: Squeeze your pelvic floor muscles tight. You should feel a tight lift in your rectal area and a tightness in your vaginal area. Keep your stomach, buttocks, and legs relaxed. Hold the muscles tight for up to 10 seconds. Then relax your muscles. Repeat this exercise 50 times a day, or as much as told by your health care provider. Continue to do this exercise for at least 4-6 weeks, or for as long as told by your health care provider.   General instructions Take over-the-counter and prescription medicines only as told by  your health care provider. Wear a sanitary pad or adult diapers if you have urinary incontinence. If you have a pessary, take care of it as told by your health care provider. Keep all follow-up visits. This is important. Contact a health care provider if you: Have symptoms that interfere with your daily activities or sex life. Need medicine to help with the discomfort. Notice bleeding from your vagina that is not related to your menstrual period. Have a fever. Have pain or bleeding when you urinate. Have bleeding when you pass stool. Pass urine when you have sex. Have chronic constipation. Have a pessary that falls out. Have a foul-smelling vaginal discharge. Have an unusual, low pain in your abdomen. Get help right away if you: Cannot pass urine. Summary Pelvic organ prolapse is the stretching, bulging, or dropping of pelvic organs into an abnormal position. It happens when the muscles and tissues that surround and support pelvic structures become weak or stretched. When organs other than the vagina are involved, they often bulge into the vagina or protrude from it, depending on how severe the prolapse is. In most cases, this condition needs to be treated only if it produces symptoms. Treatment may include lifestyle changes, estrogen, Kegel exercises, pessary insertion, or surgery. Avoid heavy lifting and straining with exercise and work. Do not hold your breath when you perform mild to moderate lifting and exercise activities. Limit your activities as directed by your health care provider. This information is not intended to replace advice given to you by your health care provider. Make sure you discuss any questions you have with your health care provider. Document Revised: 03/16/2020 Document Reviewed: 03/16/2020 Elsevier Patient Education  2023 [employer] Inc[employer]  

## 2022-09-29 LAB — CYTOLOGY - PAP
Comment: NEGATIVE
Diagnosis: NEGATIVE
Diagnosis: REACTIVE
High risk HPV: NEGATIVE

## 2022-10-26 ENCOUNTER — Encounter: Payer: 59 | Admitting: Obstetrics & Gynecology

## 2022-11-09 ENCOUNTER — Other Ambulatory Visit (HOSPITAL_BASED_OUTPATIENT_CLINIC_OR_DEPARTMENT_OTHER): Payer: Self-pay

## 2022-11-23 ENCOUNTER — Ambulatory Visit: Payer: PRIVATE HEALTH INSURANCE | Admitting: Obstetrics & Gynecology

## 2022-11-23 ENCOUNTER — Encounter: Payer: Self-pay | Admitting: Obstetrics & Gynecology

## 2022-11-23 VITALS — BP 101/51 | HR 78 | Wt 267.0 lb

## 2022-11-23 DIAGNOSIS — N813 Complete uterovaginal prolapse: Secondary | ICD-10-CM

## 2022-11-23 NOTE — Progress Notes (Signed)
History:  52 y.o. [id number] here today for management of complete uterine prolapse. Pt has planned to see UroGYN however, she just got a new job and wants to delay any surgery until she's been on the job awhile.  Here for a pessary fitting.   The following portions of the patient's history were reviewed and updated as appropriate: allergies, current medications, past family history, past medical history, past social history, past surgical history and problem list.  Review of Systems:  Pertinent items are noted in HPI.    Objective:  Physical Exam Blood pressure (!) 101/51, pulse 78, weight 267 lb (121.1 kg).  CONSTITUTIONAL: Well-developed, well-nourished female in no acute distress.  HENT:  Normocephalic, atraumatic EYES: Conjunctivae and EOM are normal. No scleral icterus.  NECK: Normal range of motion SKIN: Skin is warm and dry. No rash noted. Not diaphoretic.No pallor. [place]: Alert and oriented to person, place, and time. Normal coordination.  Abd: Soft, nontender and nondistended Pelvic: Normal appearing external genitalia; the vagina and cervix are prolapsed just beyond the introitus. NO lesions noted on vaginal mucosa or cervix.  Normal discharge.  No uterine or adnexal tenderness  PESSARY FITTING AND PLACEMENT During pelvic exam, patient was examined. Her vaginal introitus size, vaginal length, and prolapse stage wereused to guide selection of pessary type and size. After evaluation several pessary fitting guides were used. Ultimately a size 5 doughnut pessary fitting guide was placed and patient walked around room with it in place.  No prolapse was noted in standing, or in lithotomy position even after Valsalva.  The fitter was removed.  Assessment & Plan:  Complete uterine prolapse:   The pessary was ordered. Pt will be called when pessary arrives to have pessary placed.   Total face-to-face time with patient, review of chart, discussion with consultant and coordination of  care was 91mn.    Denorris Reust L. Harraway-Smith, M.D., FCherlynn June

## 2022-12-16 ENCOUNTER — Telehealth: Payer: Self-pay | Admitting: Nurse Practitioner

## 2022-12-16 NOTE — Telephone Encounter (Signed)
Prescription Request  12/16/2022  LOV: 06/30/2022  What is the name of the medication or equipment? Semaglutide-Weight Management (WEGOVY) 2.4 MG/0.75ML SOAJ [id number]   Have you contacted your pharmacy to request a refill? Yes   Which pharmacy would you like this sent to? [facility] [place] [address], [place] [address] Phone: [phone]  Fax: [phone] DEA #: [id number]      Patient notified that their request is being sent to the clinical staff for review and that they should receive a response within 2 business days.   Please advise at Mobile [phone] (mobile)

## 2022-12-19 ENCOUNTER — Telehealth: Payer: Self-pay

## 2022-12-19 ENCOUNTER — Other Ambulatory Visit (HOSPITAL_COMMUNITY): Payer: Self-pay

## 2022-12-19 NOTE — Telephone Encounter (Signed)
Pharmacy Patient Advocate Encounter   Received notification that prior authorization for [facility] is required/requested.  Per Test Claim: Prior authorization required   PA submitted on 12/19/22 to (ins) [employer] via CoverMyMeds [employer] or [facility]) confirmation # [id number] Status is pending

## 2022-12-19 NOTE — Telephone Encounter (Signed)
LVM for patient to return call. 

## 2022-12-19 NOTE — Telephone Encounter (Signed)
Patient Advocate Encounter  Prior Authorization for [facility] 2.4mg /0.51ml has been approved.    PA# [id number] Effective dates: 12/19/22 through 07/21/23  Placed a call to the pharmacy to notify of the approval. Stated they received a paid claim and will work on the order for her.

## 2022-12-20 NOTE — Telephone Encounter (Signed)
Patient notified that her Rx of Mancel Parsons was approved from 12/19/22 to 07/21/23. Also patient will call back to reschedule 12/29/22 appointment.

## 2022-12-20 NOTE — Telephone Encounter (Signed)
LVM to return call.

## 2022-12-22 ENCOUNTER — Telehealth: Payer: Self-pay

## 2022-12-22 NOTE — Telephone Encounter (Signed)
Called patient to schedule an appointment for Pessary insertion. Left message for patient to call the office back. Marketia Stallsmith l Minnette Merida, CMA

## 2022-12-29 ENCOUNTER — Ambulatory Visit: Payer: No Typology Code available for payment source | Admitting: Nurse Practitioner

## 2023-01-13 ENCOUNTER — Ambulatory Visit: Payer: PRIVATE HEALTH INSURANCE | Admitting: Nurse Practitioner

## 2023-01-13 ENCOUNTER — Other Ambulatory Visit (HOSPITAL_BASED_OUTPATIENT_CLINIC_OR_DEPARTMENT_OTHER): Payer: Self-pay

## 2023-01-13 ENCOUNTER — Encounter: Payer: Self-pay | Admitting: Nurse Practitioner

## 2023-01-13 VITALS — BP 120/88 | HR 81 | Temp 98.3°F | Ht 65.0 in | Wt 264.4 lb

## 2023-01-13 DIAGNOSIS — M25561 Pain in right knee: Secondary | ICD-10-CM

## 2023-01-13 DIAGNOSIS — M25562 Pain in left knee: Secondary | ICD-10-CM | POA: Diagnosis not present

## 2023-01-13 DIAGNOSIS — G8929 Other chronic pain: Secondary | ICD-10-CM | POA: Diagnosis not present

## 2023-01-13 MED ORDER — WEGOVY 2.4 MG/0.75ML ~~LOC~~ SOAJ
2.4000 mg | SUBCUTANEOUS | 6 refills | Status: DC
  Filled 2023-01-13: qty 3, 28d supply, fill #0
  Filled 2023-02-08 – 2023-02-09 (×2): qty 3, 28d supply, fill #1
  Filled 2023-03-08: qty 3, 28d supply, fill #2

## 2023-01-13 MED ORDER — LEVOCETIRIZINE DIHYDROCHLORIDE 5 MG PO TABS
5.0000 mg | ORAL_TABLET | Freq: Every day | ORAL | 3 refills | Status: DC
  Filled 2023-01-13: qty 30, 30d supply, fill #0

## 2023-01-13 MED ORDER — FLUTICASONE PROPIONATE 50 MCG/ACT NA SUSP
1.0000 | Freq: Every day | NASAL | 3 refills | Status: DC
  Filled 2023-01-13: qty 16, 30d supply, fill #0

## 2023-01-13 NOTE — Progress Notes (Signed)
   Established Patient Office Visit  Subjective   Patient ID: Stephanie Mcconnell, female    DOB: 1971-09-24  Age: 52 y.o. MRN: [id number]  Chief Complaint  Patient presents with   Medication Management    Rx Refills, right knee pain    HPI  Stephanie Mcconnell is here to follow up on weight management and right knee pain.  She is still taking the Wegovy injections once a week.  She has been adjusting her diet and has been more active with walking.  She has lost 15 pounds since her last visit.  She has also been having ongoing right knee pain.  She was following with orthopedics and was given oxycodone to take as needed for pain.  She states that it is doing better since losing some weight.    ROS See pertinent positives and negatives per HPI.    Objective:     BP 120/88 (BP Location: Left Arm)   Pulse 81   Temp 98.3 F (36.8 C)   Ht 5\' 5"  (1.651 m)   Wt 264 lb 6.4 oz (119.9 kg)   LMP 01/02/2023   SpO2 98%   BMI 44.00 kg/m  BP Readings from Last 3 Encounters:  01/13/23 120/88  11/23/22 (!) 101/51  09/21/22 123/63   Wt Readings from Last 3 Encounters:  01/13/23 264 lb 6.4 oz (119.9 kg)  11/23/22 267 lb (121.1 kg)  09/21/22 274 lb (124.3 kg)      Physical Exam Vitals and nursing note reviewed.  Constitutional:      General: She is not in acute distress.    Appearance: Normal appearance.  HENT:     Head: Normocephalic.  Eyes:     Conjunctiva/sclera: Conjunctivae normal.  Cardiovascular:     Rate and Rhythm: Normal rate and regular rhythm.     Pulses: Normal pulses.     Heart sounds: Normal heart sounds.  Pulmonary:     Effort: Pulmonary effort is normal.     Breath sounds: Normal breath sounds.  Musculoskeletal:     Cervical back: Normal range of motion.  Skin:    General: Skin is warm.  Neurological:     General: No focal deficit present.     Mental Status: She is alert and oriented to person, place, and time.  Psychiatric:        Mood and Affect: Mood  normal.        Behavior: Behavior normal.        Thought Content: Thought content normal.        Judgment: Judgment normal.    The 10-year ASCVD risk score (Arnett DK, et al., 2019) is: 1.6%    Assessment & Plan:   Problem List Items Addressed This Visit       Other   Morbid obesity    She has lost another 15 pounds since her last visit, congratulated her on this!  Continue Wegovy 2.4 mg injection weekly.  Follow-up in 6 months.      Relevant Medications   Semaglutide-Weight Management (WEGOVY) 2.4 MG/0.75ML SOAJ   Chronic pain of both knees - Primary    Chronic, stable.  She states that losing some weight has helped with the pain.  She is following with orthopedics as well and is taking oxycodone as needed.  Continue collaboration recommendations from specialist.       Return in about 6 months (around 07/15/2023) for CPE.    Gerre Scull, NP

## 2023-01-13 NOTE — Patient Instructions (Signed)
It was great to see you!  Keep up the great work!   Let's follow-up in 6 months, sooner if you have concerns.  If a referral was placed today, you will be contacted for an appointment. Please note that routine referrals can sometimes take up to 3-4 weeks to process. Please call our office if you haven't heard anything after this time frame.  Take care,  Damascus Feldpausch, NP  

## 2023-01-13 NOTE — Assessment & Plan Note (Signed)
Chronic, stable.  She states that losing some weight has helped with the pain.  She is following with orthopedics as well and is taking oxycodone as needed.  Continue collaboration recommendations from specialist.

## 2023-01-13 NOTE — Assessment & Plan Note (Signed)
She has lost another 15 pounds since her last visit, congratulated her on this!  Continue Wegovy 2.4 mg injection weekly.  Follow-up in 6 months.

## 2023-01-25 ENCOUNTER — Ambulatory Visit: Payer: PRIVATE HEALTH INSURANCE | Admitting: Obstetrics & Gynecology

## 2023-01-25 ENCOUNTER — Encounter: Payer: Self-pay | Admitting: Obstetrics & Gynecology

## 2023-01-25 VITALS — BP 120/75 | HR 79 | Wt 265.0 lb

## 2023-01-25 DIAGNOSIS — N812 Incomplete uterovaginal prolapse: Secondary | ICD-10-CM | POA: Diagnosis not present

## 2023-02-01 ENCOUNTER — Encounter: Payer: Self-pay | Admitting: Obstetrics & Gynecology

## 2023-02-01 NOTE — Progress Notes (Signed)
   History:  52 y.o. [id number] here today for insertion of a pessary. She was fitted several weeks ago and is here to have the device placed.   The following portions of the patient's history were reviewed and updated as appropriate: allergies, current medications, past family history, past medical history, past social history, past surgical history and problem list.  Review of Systems:  Pertinent items are noted in HPI.    Objective:  Physical Exam Blood pressure 120/75, pulse 79, weight 265 lb (120.2 kg), last menstrual period 01/02/2023. Gen: NAD Abd: Soft, nontender and nondistended Pelvic: Normal appearing external genitalia; normal appearing vaginal mucosa and cervix.  Normal discharge.  Small uterus, no other palpable masses, no uterine or adnexal tenderness  PESSARY FITTING AND PLACEMENT During pelvic exam, patient was examined. Her vaginal introitus size, vaginal length, and prolapse stage wereused to guide selection of pessary type and size. After evaluation, a 3 3/4 doughnut ring pessary was placed and patient walked around room with it in place.  No prolapse was noted in standing, or in lithotomy position even after Valsalva.    Assessment & Plan:  Uterine prolapse. For pessary insertion.   F/u in 4 weeks or sooner prn  Chey Rachels L. Harraway-Smith, M.D., [place] [place]

## 2023-02-08 ENCOUNTER — Other Ambulatory Visit (HOSPITAL_BASED_OUTPATIENT_CLINIC_OR_DEPARTMENT_OTHER): Payer: Self-pay

## 2023-02-09 ENCOUNTER — Other Ambulatory Visit (HOSPITAL_BASED_OUTPATIENT_CLINIC_OR_DEPARTMENT_OTHER): Payer: Self-pay

## 2023-02-22 ENCOUNTER — Ambulatory Visit: Payer: PRIVATE HEALTH INSURANCE | Admitting: Obstetrics & Gynecology

## 2023-02-28 ENCOUNTER — Encounter: Payer: Self-pay | Admitting: Nurse Practitioner

## 2023-03-08 ENCOUNTER — Other Ambulatory Visit (HOSPITAL_BASED_OUTPATIENT_CLINIC_OR_DEPARTMENT_OTHER): Payer: Self-pay

## 2023-03-09 LAB — HM MAMMOGRAPHY

## 2023-04-04 ENCOUNTER — Encounter: Payer: Self-pay | Admitting: Nurse Practitioner

## 2023-04-04 ENCOUNTER — Telehealth: Payer: Self-pay

## 2023-04-04 ENCOUNTER — Ambulatory Visit: Payer: PRIVATE HEALTH INSURANCE | Admitting: Nurse Practitioner

## 2023-04-04 VITALS — BP 116/80 | HR 74 | Temp 97.8°F | Resp 16 | Ht 65.0 in | Wt 259.8 lb

## 2023-04-04 DIAGNOSIS — D509 Iron deficiency anemia, unspecified: Secondary | ICD-10-CM

## 2023-04-04 DIAGNOSIS — Z0000 Encounter for general adult medical examination without abnormal findings: Secondary | ICD-10-CM | POA: Diagnosis not present

## 2023-04-04 DIAGNOSIS — G8929 Other chronic pain: Secondary | ICD-10-CM

## 2023-04-04 DIAGNOSIS — M25562 Pain in left knee: Secondary | ICD-10-CM

## 2023-04-04 DIAGNOSIS — M25561 Pain in right knee: Secondary | ICD-10-CM | POA: Diagnosis not present

## 2023-04-04 DIAGNOSIS — R7303 Prediabetes: Secondary | ICD-10-CM

## 2023-04-04 DIAGNOSIS — N814 Uterovaginal prolapse, unspecified: Secondary | ICD-10-CM

## 2023-04-04 DIAGNOSIS — E7800 Pure hypercholesterolemia, unspecified: Secondary | ICD-10-CM

## 2023-04-04 LAB — COMPREHENSIVE METABOLIC PANEL
ALT: 13 U/L (ref 0–35)
AST: 17 U/L (ref 0–37)
Albumin: 4.1 g/dL (ref 3.5–5.2)
Alkaline Phosphatase: 48 U/L (ref 39–117)
BUN: 15 mg/dL (ref 6–23)
CO2: 26 mEq/L (ref 19–32)
Calcium: 9 mg/dL (ref 8.4–10.5)
Chloride: 103 mEq/L (ref 96–112)
Creatinine, Ser: 0.68 mg/dL (ref 0.40–1.20)
GFR: 100.91 mL/min (ref >60.00)
Glucose, Bld: 81 mg/dL (ref 70–99)
Potassium: 4.3 mEq/L (ref 3.5–5.1)
Sodium: 136 mEq/L (ref 135–145)
Total Bilirubin: 0.6 mg/dL (ref 0.2–1.2)
Total Protein: 6.7 g/dL (ref 6.0–8.3)

## 2023-04-04 LAB — CBC WITH DIFFERENTIAL/PLATELET
Basophils Absolute: 0 10*3/uL (ref 0.0–0.1)
Basophils Relative: 0.7 % (ref 0.0–3.0)
Eosinophils Absolute: 0.1 10*3/uL (ref 0.0–0.7)
Eosinophils Relative: 1.9 % (ref 0.0–5.0)
HCT: 41.4 % (ref 36.0–46.0)
Hemoglobin: 13.5 g/dL (ref 12.0–15.0)
Lymphocytes Relative: 41.1 % (ref 12.0–46.0)
Lymphs Abs: 2.1 10*3/uL (ref 0.7–4.0)
MCHC: 32.6 g/dL (ref 30.0–36.0)
MCV: 88.2 fl (ref 78.0–100.0)
Monocytes Absolute: 0.5 10*3/uL (ref 0.1–1.0)
Monocytes Relative: 9.4 % (ref 3.0–12.0)
Neutro Abs: 2.4 10*3/uL (ref 1.4–7.7)
Neutrophils Relative %: 46.9 % (ref 43.0–77.0)
Platelets: 233 10*3/uL (ref 150.0–400.0)
RBC: 4.69 Mil/uL (ref 3.87–5.11)
RDW: 15.2 % (ref 11.5–15.5)
WBC: 5.1 10*3/uL (ref 4.0–10.5)

## 2023-04-04 LAB — LIPID PANEL
Cholesterol: 173 mg/dL (ref 0–200)
HDL: 52.5 mg/dL (ref >39.00)
LDL Cholesterol: 110 mg/dL — ABNORMAL HIGH (ref 0–99)
NonHDL: 120.85
Total CHOL/HDL Ratio: 3
Triglycerides: 54 mg/dL (ref 0.0–149.0)
VLDL: 10.8 mg/dL (ref 0.0–40.0)

## 2023-04-04 LAB — TSH: TSH: 0.6 u[IU]/mL (ref 0.35–5.50)

## 2023-04-04 LAB — HEMOGLOBIN A1C: Hgb A1c MFr Bld: 5.5 % (ref 4.6–6.5)

## 2023-04-04 MED ORDER — MONTELUKAST SODIUM 10 MG PO TABS: 10.0000 mg | ORAL_TABLET | Freq: Every day | ORAL | 11 refills | Status: AC

## 2023-04-04 MED ORDER — AZELASTINE HCL 137 MCG/SPRAY NA SOLN: NASAL | 1 refills | Status: AC

## 2023-04-04 MED ORDER — FLUTICASONE PROPIONATE 50 MCG/ACT NA SUSP: 1.0000 | Freq: Every day | NASAL | 3 refills | Status: AC

## 2023-04-04 MED ORDER — LEVOCETIRIZINE DIHYDROCHLORIDE 5 MG PO TABS: 5.0000 mg | ORAL_TABLET | Freq: Every day | ORAL | 11 refills | Status: AC

## 2023-04-04 MED ORDER — AZELASTINE HCL 137 MCG/SPRAY NA SOLN: NASAL | 1 refills | Status: DC

## 2023-04-04 NOTE — Assessment & Plan Note (Signed)
Health maintenance reviewed and updated. Discussed nutrition, exercise. Check CMP, CBC, TSH today. Follow-up 1 year.   

## 2023-04-04 NOTE — Assessment & Plan Note (Signed)
Chronic, stable.  She is following with orthopedics. Pain has improved since she has lost some weight.

## 2023-04-04 NOTE — Assessment & Plan Note (Addendum)
She has lost another 6 pounds since her last visit. Congratulated her on this. She is interested in switching to Zepbound. Will have her start 7.5mg  injection weekly since she is tolerating Wegovy 2.4mg  injection weekly. Follow-up in 3 months. Continue nutrition and exercise changes.

## 2023-04-04 NOTE — Progress Notes (Signed)
   04/04/2023  Patient ID: Stephanie Mcconnell, female   DOB: 05-28-1971, 52 y.o.   MRN: [id number]  Clinic routed request from patient's PCP, Rodman Pickle, regarding medication dosing.  Patient is currently taking Wegovy 2.4mg  weekly and would like to switch to Zepbound.  I recommended switching to Zepbound at the 10mg  weekly dose and increasing by 2.5mg  every 4 weeks if tolerating/needed.  This medication is on a national backorder, and the 10mg  dose may be hard to find.  Suggested that if patient has been tolerating Wegovy 2.4mg  weekly with no adverse side effects for a while, she could probably even go to Zepbound 12.5mg  weekly.  Lenna Gilford, PharmD, DPLA

## 2023-04-04 NOTE — Assessment & Plan Note (Signed)
Check A1c and treat based on results.  

## 2023-04-04 NOTE — Assessment & Plan Note (Signed)
Chronic, stable. Check CBC and iron panel today. Treat based on results.

## 2023-04-04 NOTE — Progress Notes (Signed)
BP 116/80 (BP Location: Left Arm, Patient Position: Sitting, Cuff Size: Large)   Pulse 74   Temp 97.8 F (36.6 C) (Oral)   Resp 16   Ht 5\' 5"  (1.651 m)   Wt 259 lb 12.8 oz (117.8 kg)   SpO2 97%   BMI 43.23 kg/m    Subjective:    Patient ID: Stephanie Mcconnell, female    DOB: 1971/04/10, 52 y.o.   MRN: [id number]  CC: Chief Complaint  Patient presents with   Annual Exam    Fasting    HPI: Stephanie Mcconnell is a 52 y.o. female presenting on 04/04/2023 for comprehensive medical examination. Current medical complaints include: weight management  She is still doing well on the [facility] 2.4mg  injection weekly with minimal side effects. She is interested in switching to Zepbound to see if this will help with further weight loss. She has been watching what she is eating, focusing on fruits and vegetables. She has also been walking more frequently. She states that her knee pain has improved since losing weight.   She currently lives with: husband, daughter Menopausal Symptoms: no  Depression and Anxiety Screen done today and results listed below:     04/04/2023   10:22 AM 01/13/2023    8:58 AM 03/28/2022   12:15 PM 03/28/2022   10:13 AM 12/27/2021    3:23 PM  Depression screen PHQ 2/9  Decreased Interest 0 1 1 0 1  Down, Depressed, Hopeless 0 0 0 0 0  PHQ - 2 Score 0 1 1 0 1  Altered sleeping 0 2 0  0  Tired, decreased energy 0 1 1  1   Change in appetite 0 1 0  0  Feeling bad or failure about yourself  0 0 0  0  Trouble concentrating 0 2 2  1   Moving slowly or fidgety/restless 0 0 0  0  Suicidal thoughts 0 0 0  0  PHQ-9 Score 0 7 4  3   Difficult doing work/chores  Somewhat difficult Not difficult at all        04/04/2023   10:23 AM 01/13/2023    8:58 AM 03/28/2022   12:15 PM 12/27/2021    3:23 PM  GAD 7 : Generalized Anxiety Score  Nervous, Anxious, on Edge 0 0 0 0  Control/stop worrying 0 0 0 0  Worry too much - different things 0 0 0 0  Trouble relaxing 0 0 0 0  Restless 0 0 0 0   Easily annoyed or irritable 0 0 0 1  Afraid - awful might happen 0 0 0 0  Total GAD 7 Score 0 0 0 1  Anxiety Difficulty  Somewhat difficult Not difficult at all     The patient does not have a history of falls. I did not complete a risk assessment for falls. A plan of care for falls was not documented.   Past Medical History:  Past Medical History:  Diagnosis Date   Acute meniscal tear of left knee    Anemia    secondary to uterine fibroids with AUB   Bulging disc    lower back.    History of 2019 novel coronavirus disease (COVID-19) 03/04/2021   per pt positive home covid test,  only sore throat that resolved   History of exposure to tuberculosis 2003   per pt treated but never dx'd   Menorrhagia    OA (osteoarthritis)    Seasonal allergic rhinitis    Uterine fibroid  Varicose vein of leg     Surgical History:  Past Surgical History:  Procedure Laterality Date   COLONOSCOPY  01/06/2021   dr Marina Goodell   ENDOSCOPIC VEIN LASER TREATMENT     [facility]   ESSURE TUBAL LIGATION     per pt in GYN office, approx. 2017   HYSTEROSCOPY WITH NOVASURE N/A 04/06/2021   Procedure: HYSTEROSCOPY WITH NOVASURE;  Surgeon: Willodean Rosenthal, MD;  Location: [facility];  Service: Gynecology;  Laterality: N/A;   KNEE ARTHROSCOPY WITH MEDIAL MENISECTOMY Right 02/10/2021   Procedure: RIGHT KNEE ARTHROSCOPY WITH PARTIAL MEDIAL MENISECTOMY AND DEBRIDEMENT;  Surgeon: Jene Every, MD;  Location: [facility];  Service: Orthopedics;  Laterality: Right;  60 MINS   KNEE ARTHROSCOPY WITH MEDIAL MENISECTOMY Left 04/23/2021   Procedure: KNEE ARTHROSCOPY WITH PARTIAL MEDIAL MENISECTOMY AND DEBRIDEMENT;  Surgeon: Jene Every, MD;  Location: [facility];  Service: Orthopedics;  Laterality: Left;   MASS EXCISION  03/12/2012   LIPOMA-  Procedure: EXCISION MASS;  Surgeon: Lodema Pilot, DO;  Location: [facility];  Service: General;  Laterality: Right;    VENTRAL HERNIA REPAIR  03/12/2012    Procedure: LAPAROSCOPIC VENTRAL HERNIA;  Surgeon: Lodema Pilot, DO;  Location: [facility];  Service: General;  Laterality: N/A;    Medications:  Current Outpatient Medications on File Prior to Visit  Medication Sig   misoprostol (CYTOTEC) 200 MCG tablet Take 2 tabs po AM of procedure to soften cervix.   ibuprofen (ADVIL) 600 MG tablet Take 1 tablet (600 mg total) by mouth every 8 (eight) hours as needed for moderate pain or cramping. (Patient not taking: Reported on 01/25/2023)   Multiple Vitamin (MULTIVITAMIN) capsule Take 1 capsule by mouth 3 (three) times a week. (Patient not taking: Reported on 01/25/2023)   No current facility-administered medications on file prior to visit.    Allergies:  No Known Allergies  Social History:  Social History   Socioeconomic History   Marital status: Married    Spouse name: Not on file   Number of children: 2   Years of education: Not on file   Highest education level: Not on file  Occupational History   Occupation: Nurse Tech    Comment: Flex, weekends  Tobacco Use   Smoking status: Never   Smokeless tobacco: Never  Vaping Use   Vaping Use: Never used  Substance and Sexual Activity   Alcohol use: Yes    Alcohol/week: 1.0 standard drink of alcohol    Types: 1 Glasses of wine per week    Comment: rarely   Drug use: Never   Sexual activity: Yes    Partners: Male    Birth control/protection: Other-see comments    Comment: ESSURE DEVICE  Other Topics Concern   Not on file  Social History Narrative   Not on file   Social Determinants of Health   Financial Resource Strain: Not on file  Food Insecurity: Not on file  Transportation Needs: Not on file  Physical Activity: Not on file  Stress: Not on file  Social Connections: Not on file  Intimate Partner Violence: Not on file   Social History   Tobacco Use  Smoking Status Never  Smokeless Tobacco Never   Social History   Substance and Sexual Activity  Alcohol Use Yes    Alcohol/week: 1.0 standard drink of alcohol   Types: 1 Glasses of wine per week   Comment: rarely    Family History:  Family History  Problem Relation Age of Onset  Healthy Mother    Healthy Father    Colon polyps Neg Hx    Colon cancer Neg Hx    Esophageal cancer Neg Hx    Stomach cancer Neg Hx    Rectal cancer Neg Hx     Past medical history, surgical history, medications, allergies, family history and social history reviewed with patient today and changes made to appropriate areas of the chart.   Review of Systems  Constitutional: Negative.   HENT: Negative.    Eyes: Negative.   Respiratory: Negative.    Cardiovascular: Negative.   Gastrointestinal: Negative.   Genitourinary: Negative.   Musculoskeletal: Negative.   Skin: Negative.   Neurological: Negative.   Psychiatric/Behavioral: Negative.     All other ROS negative except what is listed above and in the HPI.      Objective:    BP 116/80 (BP Location: Left Arm, Patient Position: Sitting, Cuff Size: Large)   Pulse 74   Temp 97.8 F (36.6 C) (Oral)   Resp 16   Ht 5\' 5"  (1.651 m)   Wt 259 lb 12.8 oz (117.8 kg)   SpO2 97%   BMI 43.23 kg/m   Wt Readings from Last 3 Encounters:  04/04/23 259 lb 12.8 oz (117.8 kg)  01/25/23 265 lb (120.2 kg)  01/13/23 264 lb 6.4 oz (119.9 kg)    Physical Exam Vitals and nursing note reviewed.  Constitutional:      General: She is not in acute distress.    Appearance: Normal appearance. She is obese.  HENT:     Head: Normocephalic and atraumatic.     Right Ear: Tympanic membrane, ear canal and external ear normal.     Left Ear: Tympanic membrane, ear canal and external ear normal.  Eyes:     Conjunctiva/sclera: Conjunctivae normal.  Cardiovascular:     Rate and Rhythm: Normal rate and regular rhythm.     Pulses: Normal pulses.     Heart sounds: Normal heart sounds.  Pulmonary:     Effort: Pulmonary effort is normal.     Breath sounds: Normal breath sounds.   Abdominal:     Palpations: Abdomen is soft.     Tenderness: There is no abdominal tenderness.  Musculoskeletal:        General: Normal range of motion.     Cervical back: Normal range of motion and neck supple.     Right lower leg: No edema.     Left lower leg: No edema.  Lymphadenopathy:     Cervical: No cervical adenopathy.  Skin:    General: Skin is warm and dry.  Neurological:     General: No focal deficit present.     Mental Status: She is alert and oriented to person, place, and time.     Cranial Nerves: No cranial nerve deficit.     Coordination: Coordination normal.     Gait: Gait normal.  Psychiatric:        Mood and Affect: Mood normal.        Behavior: Behavior normal.        Thought Content: Thought content normal.        Judgment: Judgment normal.     Results for orders placed or performed in visit on 04/04/23  HM MAMMOGRAPHY  Result Value Ref Range   HM Mammogram 0-4 Bi-Rad 0-4 Bi-Rad, Self Reported Normal  CBC with Differential/Platelet  Result Value Ref Range   WBC 5.1 4.0 - 10.5 K/uL   RBC 4.69 3.87 -  5.11 Mil/uL   Hemoglobin 13.5 12.0 - 15.0 g/dL   HCT 52.8 41.3 - 24.4 %   MCV 88.2 78.0 - 100.0 fl   MCHC 32.6 30.0 - 36.0 g/dL   RDW 01.0 27.2 - 53.6 %   Platelets 233.0 150.0 - 400.0 K/uL   Neutrophils Relative % 46.9 43.0 - 77.0 %   Lymphocytes Relative 41.1 12.0 - 46.0 %   Monocytes Relative 9.4 3.0 - 12.0 %   Eosinophils Relative 1.9 0.0 - 5.0 %   Basophils Relative 0.7 0.0 - 3.0 %   Neutro Abs 2.4 1.4 - 7.7 K/uL   Lymphs Abs 2.1 0.7 - 4.0 K/uL   Monocytes Absolute 0.5 0.1 - 1.0 K/uL   Eosinophils Absolute 0.1 0.0 - 0.7 K/uL   Basophils Absolute 0.0 0.0 - 0.1 K/uL  Comprehensive metabolic panel  Result Value Ref Range   Sodium 136 135 - 145 mEq/L   Potassium 4.3 3.5 - 5.1 mEq/L   Chloride 103 96 - 112 mEq/L   CO2 26 19 - 32 mEq/L   Glucose, Bld 81 70 - 99 mg/dL   BUN 15 6 - 23 mg/dL   Creatinine, Ser 6.44 0.40 - 1.20 mg/dL   Total  Bilirubin 0.6 0.2 - 1.2 mg/dL   Alkaline Phosphatase 48 39 - 117 U/L   AST 17 0 - 37 U/L   ALT 13 0 - 35 U/L   Total Protein 6.7 6.0 - 8.3 g/dL   Albumin 4.1 3.5 - 5.2 g/dL   GFR 034.74 >25.95 mL/min   Calcium 9.0 8.4 - 10.5 mg/dL  Iron, TIBC and Ferritin Panel  Result Value Ref Range   Iron 88 45 - 160 mcg/dL   TIBC 638 756 - 433 mcg/dL (calc)   %SAT 26 16 - 45 % (calc)   Ferritin 47 16 - 232 ng/mL  Hemoglobin A1c  Result Value Ref Range   Hgb A1c MFr Bld 5.5 4.6 - 6.5 %  TSH  Result Value Ref Range   TSH 0.60 0.35 - 5.50 uIU/mL  Lipid panel  Result Value Ref Range   Cholesterol 173 0 - 200 mg/dL   Triglycerides 29.5 0.0 - 149.0 mg/dL   HDL 18.84 >16.60 mg/dL   VLDL 63.0 0.0 - 16.0 mg/dL   LDL Cholesterol 109 (H) 0 - 99 mg/dL   Total CHOL/HDL Ratio 3    NonHDL 120.85       Assessment & Plan:   Problem List Items Addressed This Visit       Genitourinary   Uterine prolapse    She is currently following with GYN and has a pessary that is controlling her symptoms.         Other   Morbid obesity (HCC)    She has lost another 6 pounds since her last visit. Congratulated her on this. She is interested in switching to Zepbound. Will have her start 7.5mg  injection weekly since she is tolerating Wegovy 2.4mg  injection weekly. Follow-up in 3 months. Continue nutrition and exercise changes.       Relevant Medications   tirzepatide (ZEPBOUND) 7.5 MG/0.5ML Pen   Chronic pain of both knees    Chronic, stable.  She is following with orthopedics. Pain has improved since she has lost some weight.       Prediabetes    Check A1c and treat based on results.       Relevant Orders   Hemoglobin A1c (Completed)   Anemia    Chronic, stable.  Check CBC and iron panel today. Treat based on results.       Relevant Orders   CBC with Differential/Platelet (Completed)   Iron, TIBC and Ferritin Panel (Completed)   Routine general medical examination at a health care facility -  Primary    Health maintenance reviewed and updated. Discussed nutrition, exercise. Check CMP, CBC, TSH today. Follow-up 1 year.        Relevant Orders   CBC with Differential/Platelet (Completed)   Comprehensive metabolic panel (Completed)   TSH (Completed)   Pure hypercholesterolemia    Check CMP, CBC, lipid panel today. Treat based on results.       Relevant Orders   CBC with Differential/Platelet (Completed)   Comprehensive metabolic panel (Completed)   Lipid panel (Completed)     Follow up plan: Return in about 3 months (around 07/05/2023) for weight management .   LABORATORY TESTING:  - Pap smear: up to date  IMMUNIZATIONS:   - Tdap: Tetanus vaccination status reviewed: last tetanus booster within 10 years. - Influenza: Up to date - Pneumovax: Not applicable - Prevnar: Not applicable - HPV: Not applicable - Zostavax vaccine: Up to date  SCREENING: -Mammogram: Up to date  - Colonoscopy: Up to date  - Bone Density: Not applicable   PATIENT COUNSELING:   Advised to take 1 mg of folate supplement per day if capable of pregnancy.   Sexuality: Discussed sexually transmitted diseases, partner selection, use of condoms, avoidance of unintended pregnancy  and contraceptive alternatives.   Advised to avoid cigarette smoking.  I discussed with the patient that most people either abstain from alcohol or drink within safe limits (<=14/week and <=4 drinks/occasion for males, <=7/weeks and <= 3 drinks/occasion for females) and that the risk for alcohol disorders and other health effects rises proportionally with the number of drinks per week and how often a drinker exceeds daily limits.  Discussed cessation/primary prevention of drug use and availability of treatment for abuse.   Diet: Encouraged to adjust caloric intake to maintain  or achieve ideal body weight, to reduce intake of dietary saturated fat and total fat, to limit sodium intake by avoiding high sodium foods and  not adding table salt, and to maintain adequate dietary potassium and calcium preferably from fresh fruits, vegetables, and low-fat dairy products.    stressed the importance of regular exercise  Injury prevention: Discussed safety belts, safety helmets, smoke detector, smoking near bedding or upholstery.   Dental health: Discussed importance of regular tooth brushing, flossing, and dental visits.    NEXT PREVENTATIVE PHYSICAL DUE IN 1 YEAR. Return in about 3 months (around 07/05/2023) for weight management .

## 2023-04-04 NOTE — Assessment & Plan Note (Signed)
She is currently following with GYN and has a pessary that is controlling her symptoms.

## 2023-04-04 NOTE — Patient Instructions (Addendum)
It was great to see you!  We are checking your labs today and will let you know the results via mychart/phone.   We will switch your wegovy to zepbound, your insurance may need to do a prior authorization, so it may take a week to get to your pharmacy.   Keep up the great work!   Let's follow-up in 3 months, sooner if you have concerns.  If a referral was placed today, you will be contacted for an appointment. Please note that routine referrals can sometimes take up to 3-4 weeks to process. Please call our office if you haven't heard anything after this time frame.  Take care,  Rodman Pickle, NP

## 2023-04-04 NOTE — Assessment & Plan Note (Signed)
Check CMP, CBC, lipid panel today. Treat based on results.

## 2023-04-05 LAB — IRON,TIBC AND FERRITIN PANEL
%SAT: 26 % (calc) (ref 16–45)
Ferritin: 47 ng/mL (ref 16–232)
Iron: 88 ug/dL (ref 45–160)
TIBC: 344 mcg/dL (calc) (ref 250–450)

## 2023-04-05 MED ORDER — ZEPBOUND 7.5 MG/0.5ML ~~LOC~~ SOAJ: 7.5000 mg | SUBCUTANEOUS | 1 refills | Status: AC

## 2023-04-07 ENCOUNTER — Telehealth: Payer: Self-pay

## 2023-04-07 NOTE — Telephone Encounter (Signed)
*  Primary  PA request received for Zepbound 7.5MG /0.5ML pen-injectors  PA submitted to [employer] via CMM and is pending additional questions/determination  Key: [id number]

## 2023-04-21 NOTE — Telephone Encounter (Signed)
PA has been APPROVED from 04/07/2023-10/08/2023

## 2023-08-23 ENCOUNTER — Ambulatory Visit: Payer: PRIVATE HEALTH INSURANCE | Admitting: Nurse Practitioner

## 2023-09-15 ENCOUNTER — Other Ambulatory Visit (HOSPITAL_COMMUNITY): Payer: Self-pay

## 2023-09-15 ENCOUNTER — Telehealth: Payer: Self-pay

## 2023-09-15 NOTE — Telephone Encounter (Signed)
Pharmacy Patient Advocate Encounter   Received notification from CoverMyMeds that prior authorization for Zepbound 7.5MG /0.5ML pen-injectors is required/requested.   Insurance verification completed.   The patient is insured through  [employer]  .   Per test claim: Refill too soon. PA is not needed at this time. Medication was filled NA. Next eligible fill date is NA.
